# Patient Record
Sex: Female | Born: 1955 | Race: White | Hispanic: No | Marital: Married | Smoking: Former smoker
Health system: Southern US, Community
[De-identification: ages and names within clinical notes are randomized; demographics above are authoritative.]

## PROBLEM LIST (undated history)

## (undated) DIAGNOSIS — C801 Malignant (primary) neoplasm, unspecified: Secondary | ICD-10-CM

## (undated) DIAGNOSIS — K8020 Calculus of gallbladder without cholecystitis without obstruction: Secondary | ICD-10-CM

## (undated) DIAGNOSIS — B182 Chronic viral hepatitis C: Secondary | ICD-10-CM

## (undated) DIAGNOSIS — T7840XA Allergy, unspecified, initial encounter: Secondary | ICD-10-CM

## (undated) DIAGNOSIS — D649 Anemia, unspecified: Secondary | ICD-10-CM

## (undated) DIAGNOSIS — I10 Essential (primary) hypertension: Secondary | ICD-10-CM

## (undated) HISTORY — DX: Malignant (primary) neoplasm, unspecified: C80.1

## (undated) HISTORY — DX: Calculus of gallbladder without cholecystitis without obstruction: K80.20

## (undated) HISTORY — DX: Chronic viral hepatitis C: B18.2

## (undated) HISTORY — PX: COLONOSCOPY: SHX174

## (undated) HISTORY — PX: FRACTURE SURGERY: SHX138

## (undated) HISTORY — PX: UPPER GASTROINTESTINAL ENDOSCOPY: SHX188

## (undated) HISTORY — PX: TONSILLECTOMY: SUR1361

## (undated) HISTORY — PX: MOUTH SURGERY: SHX715

## (undated) HISTORY — PX: MAXILLARY SINUS LIFT: SHX5206

## (undated) HISTORY — DX: Essential (primary) hypertension: I10

## (undated) HISTORY — DX: Anemia, unspecified: D64.9

## (undated) HISTORY — DX: Allergy, unspecified, initial encounter: T78.40XA

---

## 2015-01-19 HISTORY — PX: BREAST BIOPSY: SHX20

## 2015-06-27 ENCOUNTER — Ambulatory Visit (INDEPENDENT_AMBULATORY_CARE_PROVIDER_SITE_OTHER): Payer: Self-pay | Admitting: Family Medicine

## 2015-06-27 VITALS — BP 102/74 | HR 87 | Temp 98.2°F | Resp 16 | Ht 61.5 in | Wt 126.0 lb

## 2015-06-27 DIAGNOSIS — Z1329 Encounter for screening for other suspected endocrine disorder: Secondary | ICD-10-CM

## 2015-06-27 DIAGNOSIS — Z0000 Encounter for general adult medical examination without abnormal findings: Secondary | ICD-10-CM

## 2015-06-27 DIAGNOSIS — Z1389 Encounter for screening for other disorder: Secondary | ICD-10-CM

## 2015-06-27 DIAGNOSIS — Z1211 Encounter for screening for malignant neoplasm of colon: Secondary | ICD-10-CM

## 2015-06-27 DIAGNOSIS — Z136 Encounter for screening for cardiovascular disorders: Secondary | ICD-10-CM

## 2015-06-27 DIAGNOSIS — Z130 Encounter for screening for diseases of the blood and blood-forming organs and certain disorders involving the immune mechanism: Secondary | ICD-10-CM

## 2015-06-27 DIAGNOSIS — Z720 Tobacco use: Secondary | ICD-10-CM

## 2015-06-27 DIAGNOSIS — Z1383 Encounter for screening for respiratory disorder NEC: Secondary | ICD-10-CM

## 2015-06-27 DIAGNOSIS — Z1212 Encounter for screening for malignant neoplasm of rectum: Secondary | ICD-10-CM

## 2015-06-27 DIAGNOSIS — A599 Trichomoniasis, unspecified: Secondary | ICD-10-CM

## 2015-06-27 DIAGNOSIS — F17201 Nicotine dependence, unspecified, in remission: Secondary | ICD-10-CM

## 2015-06-27 DIAGNOSIS — Z1239 Encounter for other screening for malignant neoplasm of breast: Secondary | ICD-10-CM

## 2015-06-27 DIAGNOSIS — Z124 Encounter for screening for malignant neoplasm of cervix: Secondary | ICD-10-CM

## 2015-06-27 LAB — POCT WET + KOH PREP
Trich by wet prep: ABSENT
Yeast by KOH: ABSENT
Yeast by wet prep: ABSENT

## 2015-06-27 LAB — COMPREHENSIVE METABOLIC PANEL
ALT: 43 U/L — ABNORMAL HIGH (ref 6–29)
AST: 32 U/L (ref 10–35)
Albumin: 4.8 g/dL (ref 3.6–5.1)
Alkaline Phosphatase: 70 U/L (ref 33–130)
BUN: 7 mg/dL (ref 7–25)
CALCIUM: 9.3 mg/dL (ref 8.6–10.4)
CO2: 27 mmol/L (ref 20–31)
Chloride: 96 mmol/L — ABNORMAL LOW (ref 98–110)
Creat: 0.54 mg/dL (ref 0.50–0.99)
GLUCOSE: 102 mg/dL — AB (ref 65–99)
POTASSIUM: 4.1 mmol/L (ref 3.5–5.3)
Sodium: 133 mmol/L — ABNORMAL LOW (ref 135–146)
Total Bilirubin: 0.7 mg/dL (ref 0.2–1.2)
Total Protein: 8.1 g/dL (ref 6.1–8.1)

## 2015-06-27 LAB — CBC
HEMATOCRIT: 43 % (ref 35.0–45.0)
Hemoglobin: 14.6 g/dL (ref 11.7–15.5)
MCH: 31 pg (ref 27.0–33.0)
MCHC: 34 g/dL (ref 32.0–36.0)
MCV: 91.3 fL (ref 80.0–100.0)
MPV: 10 fL (ref 7.5–12.5)
PLATELETS: 294 10*3/uL (ref 140–400)
RBC: 4.71 MIL/uL (ref 3.80–5.10)
RDW: 12.9 % (ref 11.0–15.0)
WBC: 11.4 10*3/uL — AB (ref 3.8–10.8)

## 2015-06-27 LAB — POCT URINALYSIS DIP (MANUAL ENTRY)
Bilirubin, UA: NEGATIVE
GLUCOSE UA: NEGATIVE
Ketones, POC UA: NEGATIVE
Nitrite, UA: NEGATIVE
PH UA: 6
Protein Ur, POC: NEGATIVE
RBC UA: NEGATIVE
UROBILINOGEN UA: 0.2

## 2015-06-27 LAB — TSH: TSH: 1.5 mIU/L

## 2015-06-27 LAB — LIPID PANEL
Cholesterol: 192 mg/dL (ref 125–200)
HDL: 92 mg/dL (ref >=46)
LDL CALC: 89 mg/dL (ref <130)
TRIGLYCERIDES: 54 mg/dL (ref <150)
Total CHOL/HDL Ratio: 2.1 Ratio (ref <=5.0)
VLDL: 11 mg/dL (ref <30)

## 2015-06-27 MED ORDER — VARENICLINE TARTRATE 1 MG PO TABS
1.0000 mg | ORAL_TABLET | Freq: Two times a day (BID) | ORAL | Status: DC
Start: 2015-06-27 — End: 2015-07-17

## 2015-06-27 MED ORDER — VARENICLINE TARTRATE 0.5 MG X 11 & 1 MG X 42 PO MISC: ORAL | Status: DC

## 2015-06-27 NOTE — Patient Instructions (Addendum)
IF you received an x-ray today, you will receive an invoice from [facility]. Please contact [facility] at [phone] with questions or concerns regarding your invoice.   IF you received labwork today, you will receive an invoice from [employer]. Please contact [facility] at [phone] with questions or concerns regarding your invoice.   Our billing staff will not be able to assist you with questions regarding bills from these companies.  You will be contacted with the lab results as soon as they are available. The fastest way to get your results is to activate your My Chart account. Instructions are located on the last page of this paperwork. If you have not heard from [place] regarding the results in 2 weeks, please contact this office.     Keeping You Healthy  Get These Tests  Blood Pressure- Have your blood pressure checked by your healthcare provider at least once a year.  Normal blood pressure is 120/80.  Weight- Have your body mass index (BMI) calculated to screen for obesity.  BMI is a measure of body fat based on height and weight.  You can calculate your own BMI at [employer]  Cholesterol- Have your cholesterol checked every year.  Diabetes- Have your blood sugar checked every year if you have high blood pressure, high cholesterol, a family history of diabetes or if you are overweight.  Pap Test - Have a pap test every 1 to 5 years if you have been sexually active.  If you are older than 65 and recent pap tests have been normal you may not need additional pap tests.  In addition, if you have had a hysterectomy  for benign disease additional pap tests are not necessary.  Mammogram-Yearly mammograms are essential for early detection of breast cancer  Screening for Colon Cancer- Colonoscopy starting at age 35. Screening may begin sooner depending on your family history and other health conditions.  Follow up  colonoscopy as directed by your Gastroenterologist.  Screening for Osteoporosis- Screening begins at age 30 with bone density scanning, sooner if you are at higher risk for developing Osteoporosis.  Get these medicines  Calcium with Vitamin D- Your body requires 1200-1500 mg of Calcium a day and [phone] IU of Vitamin D a day.  You can only absorb 500 mg of Calcium at a time therefore Calcium must be taken in 2 or 3 separate doses throughout the day.  Hormones- Hormone therapy has been associated with increased risk for certain cancers and heart disease.  Talk to your healthcare provider about if you need relief from menopausal symptoms.  Aspirin- Ask your healthcare provider about taking Aspirin to prevent Heart Disease and Stroke.  Get these Immuniztions  Flu shot- Every fall  Pneumonia shot- Once after the age of 21; if you are younger ask your healthcare provider if you need a pneumonia shot.  Tetanus- Every ten years.  Zostavax- Once after the age of 59 to prevent shingles.  Take these steps  Don't smoke- Your healthcare provider can help you quit. For tips on how to quit, ask your healthcare provider or go to www.smokefree.gov or call 1-800 QUIT-NOW.  Be physically active- Exercise 5 days a week for a minimum of 30 minutes.  If you are not already physically active, start slow and gradually work up to 30 minutes of moderate physical activity.  Try walking, dancing, bike riding, swimming, etc.  Eat a healthy diet- Eat a variety of healthy foods such as fruits, vegetables,  whole grains, low fat milk, low fat cheeses, yogurt, lean meats, chicken, fish, eggs, dried beans, tofu, etc.  For more information go to www.thenutritionsource.org  Dental visit- Brush and floss teeth twice daily; visit your dentist twice a year.  Eye exam- Visit your Optometrist or Ophthalmologist yearly.  Drink alcohol in moderation- Limit alcohol intake to one drink or less a day.  Never drink and  drive.  Depression- Your emotional health is as important as your physical health.  If you're feeling down or losing interest in things you normally enjoy, please talk to your healthcare provider.  Seat Belts- can save your life; always wear one  Smoke/Carbon Monoxide detectors- These detectors need to be installed on the appropriate level of your home.  Replace batteries at least once a year.  Violence- If anyone is threatening or hurting you, please tell your healthcare provider. Menopause is a normal process in which your reproductive ability comes to an end. This process happens gradually over a span of months to years, usually between the ages of 71 and 80. Menopause is complete when you have missed 12 consecutive menstrual periods. It is important to talk with your health care provider about some of the most common conditions that affect postmenopausal women, such as heart disease, cancer, and bone loss (osteoporosis). Adopting a healthy lifestyle and getting preventive care can help to promote your health and wellness. Those actions can also lower your chances of developing some of these common conditions. WHAT SHOULD I KNOW ABOUT MENOPAUSE? During menopause, you may experience a number of symptoms, such as:  Moderate-to-severe hot flashes.  Night sweats.  Decrease in sex drive.  Mood swings.  Headaches.  Tiredness.  Irritability.  Memory problems.  Insomnia. Choosing to treat or not to treat menopausal changes is an individual decision that you make with your health care provider. WHAT SHOULD I KNOW ABOUT HORMONE REPLACEMENT THERAPY AND SUPPLEMENTS? Hormone therapy products are effective for treating symptoms that are associated with menopause, such as hot flashes and night sweats. Hormone replacement carries certain risks, especially as you become older. If you are thinking about using estrogen or estrogen with progestin treatments, discuss the benefits and risks with your  health care provider. WHAT SHOULD I KNOW ABOUT HEART DISEASE AND STROKE? Heart disease, heart attack, and stroke become more likely as you age. This may be due, in part, to the hormonal changes that your body experiences during menopause. These can affect how your body processes dietary fats, triglycerides, and cholesterol. Heart attack and stroke are both medical emergencies. There are many things that you can do to help prevent heart disease and stroke:  Have your blood pressure checked at least every 1-2 years. High blood pressure causes heart disease and increases the risk of stroke.  If you are 42-15 years old, ask your health care provider if you should take aspirin to prevent a heart attack or a stroke.  Do not use any tobacco products, including cigarettes, chewing tobacco, or electronic cigarettes. If you need help quitting, ask your health care provider.  It is important to eat a healthy diet and maintain a healthy weight.  Be sure to include plenty of vegetables, fruits, low-fat dairy products, and lean protein.  Avoid eating foods that are high in solid fats, added sugars, or salt (sodium).  Get regular exercise. This is one of the most important things that you can do for your health.  Try to exercise for at least 150 minutes each week. The  type of exercise that you do should increase your heart rate and make you sweat. This is known as moderate-intensity exercise.  Try to do strengthening exercises at least twice each week. Do these in addition to the moderate-intensity exercise.  Know your numbers.Ask your health care provider to check your cholesterol and your blood glucose. Continue to have your blood tested as directed by your health care provider. WHAT SHOULD I KNOW ABOUT CANCER SCREENING? There are several types of cancer. Take the following steps to reduce your risk and to catch any cancer development as early as possible. Breast Cancer  Practice breast  self-awareness.  This means understanding how your breasts normally appear and feel.  It also means doing regular breast self-exams. Let your health care provider know about any changes, no matter how small.  If you are 72 or older, have a clinician do a breast exam (clinical breast exam or CBE) every year. Depending on your age, family history, and medical history, it may be recommended that you also have a yearly breast X-ray (mammogram).  If you have a family history of breast cancer, talk with your health care provider about genetic screening.  If you are at high risk for breast cancer, talk with your health care provider about having an MRI and a mammogram every year.  Breast cancer (BRCA) gene test is recommended for women who have family members with BRCA-related cancers. Results of the assessment will determine the need for genetic counseling and BRCA1 and for BRCA2 testing. BRCA-related cancers include these types:  Breast. This occurs in males or females.  Ovarian.  Tubal. This may also be called fallopian tube cancer.  Cancer of the abdominal or pelvic lining (peritoneal cancer).  Prostate.  Pancreatic. Cervical, Uterine, and Ovarian Cancer Your health care provider may recommend that you be screened regularly for cancer of the pelvic organs. These include your ovaries, uterus, and vagina. This screening involves a pelvic exam, which includes checking for microscopic changes to the surface of your cervix (Pap test).  For women ages 21-65, health care providers may recommend a pelvic exam and a Pap test every three years. For women ages 42-65, they may recommend the Pap test and pelvic exam, combined with testing for human papilloma virus (HPV), every five years. Some types of HPV increase your risk of cervical cancer. Testing for HPV may also be done on women of any age who have unclear Pap test results.  Other health care providers may not recommend any screening for  nonpregnant women who are considered low risk for pelvic cancer and have no symptoms. Ask your health care provider if a screening pelvic exam is right for you.  If you have had past treatment for cervical cancer or a condition that could lead to cancer, you need Pap tests and screening for cancer for at least 20 years after your treatment. If Pap tests have been discontinued for you, your risk factors (such as having a new sexual partner) need to be reassessed to determine if you should start having screenings again. Some women have medical problems that increase the chance of getting cervical cancer. In these cases, your health care provider may recommend that you have screening and Pap tests more often.  If you have a family history of uterine cancer or ovarian cancer, talk with your health care provider about genetic screening.  If you have vaginal bleeding after reaching menopause, tell your health care provider.  There are currently no reliable tests available  to screen for ovarian cancer. Lung Cancer Lung cancer screening is recommended for adults 27-8 years old who are at high risk for lung cancer because of a history of smoking. A yearly low-dose CT scan of the lungs is recommended if you:  Currently smoke.  Have a history of at least 30 pack-years of smoking and you currently smoke or have quit within the past 15 years. A pack-year is smoking an average of one pack of cigarettes per day for one year. Yearly screening should:  Continue until it has been 15 years since you quit.  Stop if you develop a health problem that would prevent you from having lung cancer treatment. Colorectal Cancer  This type of cancer can be detected and can often be prevented.  Routine colorectal cancer screening usually begins at age 86 and continues through age 57.  If you have risk factors for colon cancer, your health care provider may recommend that you be screened at an earlier age.  If you have  a family history of colorectal cancer, talk with your health care provider about genetic screening.  Your health care provider may also recommend using home test kits to check for hidden blood in your stool.  A small camera at the end of a tube can be used to examine your colon directly (sigmoidoscopy or colonoscopy). This is done to check for the earliest forms of colorectal cancer.  Direct examination of the colon should be repeated every 5-10 years until age 4. However, if early forms of precancerous polyps or small growths are found or if you have a family history or genetic risk for colorectal cancer, you may need to be screened more often. Skin Cancer  Check your skin from head to toe regularly.  Monitor any moles. Be sure to tell your health care provider:  About any new moles or changes in moles, especially if there is a change in a mole's shape or color.  If you have a mole that is larger than the size of a pencil eraser.  If any of your family members has a history of skin cancer, especially at a young age, talk with your health care provider about genetic screening.  Always use sunscreen. Apply sunscreen liberally and repeatedly throughout the day.  Whenever you are outside, protect yourself by wearing long sleeves, pants, a wide-brimmed hat, and sunglasses. WHAT SHOULD I KNOW ABOUT OSTEOPOROSIS? Osteoporosis is a condition in which bone destruction happens more quickly than new bone creation. After menopause, you may be at an increased risk for osteoporosis. To help prevent osteoporosis or the bone fractures that can happen because of osteoporosis, the following is recommended:  If you are 30-71 years old, get at least 1,000 mg of calcium and at least 600 mg of vitamin D per day.  If you are older than age 33 but younger than age 70, get at least 1,200 mg of calcium and at least 600 mg of vitamin D per day.  If you are older than age 69, get at least 1,200 mg of calcium and  at least 800 mg of vitamin D per day. Smoking and excessive alcohol intake increase the risk of osteoporosis. Eat foods that are rich in calcium and vitamin D, and do weight-bearing exercises several times each week as directed by your health care provider. WHAT SHOULD I KNOW ABOUT HOW MENOPAUSE AFFECTS [place]? Depression may occur at any age, but it is more common as you become older. Common symptoms of  depression include:  Low or sad mood.  Changes in sleep patterns.  Changes in appetite or eating patterns.  Feeling an overall lack of motivation or enjoyment of activities that you previously enjoyed.  Frequent crying spells. Talk with your health care provider if you think that you are experiencing depression. WHAT SHOULD I KNOW ABOUT IMMUNIZATIONS? It is important that you get and maintain your immunizations. These include:  Tetanus, diphtheria, and pertussis (Tdap) booster vaccine.  Influenza every year before the flu season begins.  Pneumonia vaccine.  Shingles vaccine. Your health care provider may also recommend other immunizations.   This information is not intended to replace advice given to you by your health care provider. Make sure you discuss any questions you have with your health care provider.   Document Released: 02/26/2005 Document Revised: 01/25/2014 Document Reviewed: 09/06/2013 Elsevier Interactive Patient Education 2016 [place] care power of attorney- Discuss with your healthcare provider and family.  Smoking Cessation, Tips for Success If you are ready to quit smoking, congratulations! You have chosen to help yourself be healthier. Cigarettes bring nicotine, tar, carbon monoxide, and other irritants into your body. Your lungs, heart, and blood vessels will be able to work better without these poisons. There are many different ways to quit smoking. Nicotine gum, nicotine patches, a nicotine inhaler, or nicotine nasal  spray can help with physical craving. Hypnosis, support groups, and medicines help break the habit of smoking. WHAT THINGS CAN I DO TO MAKE QUITTING EASIER?  Here are some tips to help you quit for good:  Pick a date when you will quit smoking completely. Tell all of your friends and family about your plan to quit on that date.  Do not try to slowly cut down on the number of cigarettes you are smoking. Pick a quit date and quit smoking completely starting on that day.  Throw away all cigarettes.   Clean and remove all ashtrays from your home, work, and car.  On a card, write down your reasons for quitting. Carry the card with you and read it when you get the urge to smoke.  Cleanse your body of nicotine. Drink enough water and fluids to keep your urine clear or pale yellow. Do this after quitting to flush the nicotine from your body.  Learn to predict your moods. Do not let a bad situation be your excuse to have a cigarette. Some situations in your life might tempt you into wanting a cigarette.  Never have "just one" cigarette. It leads to wanting another and another. Remind yourself of your decision to quit.  Change habits associated with smoking. If you smoked while driving or when feeling stressed, try other activities to replace smoking. Stand up when drinking your coffee. Brush your teeth after eating. Sit in a different chair when you read the paper. Avoid alcohol while trying to quit, and try to drink fewer caffeinated beverages. Alcohol and caffeine may urge you to smoke.  Avoid foods and drinks that can trigger a desire to smoke, such as sugary or spicy foods and alcohol.  Ask people who smoke not to smoke around you.  Have something planned to do right after eating or having a cup of coffee. For example, plan to take a walk or exercise.  Try a relaxation exercise to calm you down and decrease your stress. Remember, you may be tense and nervous for the first 2 weeks after you  quit, but this will pass.  Find new activities to keep your hands busy. Play with a pen, coin, or rubber band. Doodle or draw things on paper.  Brush your teeth right after eating. This will help cut down on the craving for the taste of tobacco after meals. You can also try mouthwash.   Use oral substitutes in place of cigarettes. Try using lemon drops, carrots, cinnamon sticks, or chewing gum. Keep them handy so they are available when you have the urge to smoke.  When you have the urge to smoke, try deep breathing.  Designate your home as a nonsmoking area.  If you are a heavy smoker, ask your health care provider about a prescription for nicotine chewing gum. It can ease your withdrawal from nicotine.  Reward yourself. Set aside the cigarette money you save and buy yourself something nice.  Look for support from others. Join a support group or smoking cessation program. Ask someone at home or at work to help you with your plan to quit smoking.  Always ask yourself, "Do I need this cigarette or is this just a reflex?" Tell yourself, "Today, I choose not to smoke," or "I do not want to smoke." You are reminding yourself of your decision to quit.  Do not replace cigarette smoking with electronic cigarettes (commonly called e-cigarettes). The safety of e-cigarettes is unknown, and some may contain harmful chemicals.  If you relapse, do not give up! Plan ahead and think about what you will do the next time you get the urge to smoke. HOW WILL I FEEL WHEN I QUIT SMOKING? You may have symptoms of withdrawal because your body is used to nicotine (the addictive substance in cigarettes). You may crave cigarettes, be irritable, feel very hungry, cough often, get headaches, or have difficulty concentrating. The withdrawal symptoms are only temporary. They are strongest when you first quit but will go away within 10-14 days. When withdrawal symptoms occur, stay in control. Think about your reasons for  quitting. Remind yourself that these are signs that your body is healing and getting used to being without cigarettes. Remember that withdrawal symptoms are easier to treat than the major diseases that smoking can cause.  Even after the withdrawal is over, expect periodic urges to smoke. However, these cravings are generally short lived and will go away whether you smoke or not. Do not smoke! WHAT RESOURCES ARE AVAILABLE TO HELP ME QUIT SMOKING? Your health care provider can direct you to community resources or hospitals for support, which may include:  Group support.  Education.  Hypnosis.  Therapy.   This information is not intended to replace advice given to you by your health care provider. Make sure you discuss any questions you have with your health care provider.   Document Released: 10/03/2003 Document Revised: 01/25/2014 Document Reviewed: 06/22/2012 Elsevier Interactive Patient Education [employer].

## 2015-06-27 NOTE — Progress Notes (Addendum)
Subjective:  By signing my name below, I, Brittany Summers, attest that this documentation has been prepared under the direction and in the presence of [name], MD.  Electronically Signed: [name], ED Scribe. 06/27/2015. 8:23 AM.   Patient ID: Brittany Summers, female    DOB: 08/12/1955, 60 y.o.   MRN: [id number]  HPI Chief Complaint  Patient presents with  . Annual Exam   HPI Comments: Brittany Summers is a 60 y.o. female who presents to the [facility] for an annual exam. Pt states she has not been under medical care in a while and has come in for a physical.  Tobacco abuse Pt would like to quit smoking and is interested in Chantix. She has tried the nicotine patch and e-cigs and has gotten down to 2-5 cigarettes a day but has never fully quit. She does not smoke in the house or car. Her husband does not smoke. Her sister has tried Chantix which worked well for her and has not smoked in 4 years.   Dental  Pt is scheduled for dental work. She is on clindamycin for wisdom tooth infection.   Exercise Pt does not exercise much but is trying to start walking regularly. She tries to maintain a healthy diet including salads and yogurts.   Immunization It has been several years since she's had TDAP She does not get flu shots.  Cancer screening  Pap smear- has been over 10 years. She denies hx of abnormal pap.  Mammogram- also has been several years since last exam. Her mother and grandmother with hx of breast cancer; mother diagnosed in 50-60s, grandmother diagnosed in 67s. Pt has a sister who had a biopsy about 10 years ago which was normal.   Menopause  LMP- 10 years ago. Reports beginning menopause in early 27's. No hot flashes during the day, occasionally at night . She denies vaginal bleeding.   Pt denies trouble with sleep, SOB, mood disorder   There are no active problems to display for this patient.  No past medical history on file. No past surgical history  on file. Allergies not on file Prior to Admission medications   Not on File   Social History   Social History  . Marital Status: Married    Spouse Name: N/A  . Number of Children: N/A  . Years of Education: N/A   Occupational History  . Not on file.   Social History Main Topics  . Smoking status: Current Every Day Smoker  . Smokeless tobacco: Not on file  . Alcohol Use: Not on file  . Drug Use: Not on file  . Sexual Activity: Not on file   Other Topics Concern  . Not on file   Social History Narrative  . No narrative on file   Family History  Problem Relation Age of Onset  . Hypertension Mother   . Cancer Mother   . Hypertension Father   . Diabetes Father   . Cancer Father     Review of Systems  All other systems reviewed and are negative.   Objective:   Physical Exam  Constitutional: She is oriented to person, place, and time. She appears well-developed and well-nourished. No distress.  HENT:  Head: Normocephalic and atraumatic.  Right Ear: Tympanic membrane, external ear and ear canal normal.  Left Ear: Tympanic membrane, external ear and ear canal normal.  Nose: Nose normal. No rhinorrhea.  Mouth/Throat: Uvula is midline and mucous membranes are normal. Posterior oropharyngeal  edema and posterior oropharyngeal erythema present.  Poor dentition, and dry tongue.  Eyes: Conjunctivae and EOM are normal. Pupils are equal, round, and reactive to light. Right eye exhibits no discharge. Left eye exhibits no discharge. No scleral icterus.  Neck: Normal range of motion. Neck supple. No thyromegaly present.  Cardiovascular: Normal rate, regular rhythm, S1 normal, S2 normal, normal heart sounds and intact distal pulses.   No murmur heard. Pulmonary/Chest: Effort normal and breath sounds normal. No respiratory distress. She has no wheezes. She has no rales.  Abdominal: Soft. Bowel sounds are normal. There is no tenderness.  Genitourinary: Uterus normal. Rectal exam  shows no external hemorrhoid. No breast swelling, tenderness, discharge or bleeding. Cervix exhibits no motion tenderness and no friability. Right adnexum displays no mass and no tenderness. Left adnexum displays no mass and no tenderness. Vaginal discharge ( moderate white ) found.  Normal breast exam Labia is normal Cervix is stenotic Uterus and adnexa is normally palpated  Musculoskeletal: Normal range of motion. She exhibits no edema.  Lymphadenopathy:       Head (right side): Tonsillar adenopathy present.       Head (left side): Tonsillar adenopathy present.    She has cervical adenopathy.    She has no axillary adenopathy.       Right: No supraclavicular adenopathy present.       Left: No supraclavicular adenopathy present.  Neurological: She is alert and oriented to person, place, and time. She has normal reflexes.  Skin: Skin is warm and dry. She is not diaphoretic. No erythema.  Psychiatric: She has a normal mood and affect. Her behavior is normal.  Nursing note and vitals reviewed.   Filed Vitals:   06/27/15 0822  BP: 102/74  Pulse: 87  Temp: 98.2 F (36.8 C)  Resp: 16  Height: 5' 1.5" (1.562 m)  Weight: 126 lb (57.153 kg)  SpO2: 99%    Results for orders placed or performed in visit on 06/27/15  POCT urinalysis dipstick  Result Value Ref Range   Color, UA yellow yellow   Clarity, UA clear clear   Glucose, UA negative negative   Bilirubin, UA negative negative   Ketones, POC UA negative negative   Spec Grav, UA <=1.005    Blood, UA negative negative   pH, UA 6.0    Protein Ur, POC negative negative   Urobilinogen, UA 0.2    Nitrite, UA Negative Negative   Leukocytes, UA Trace (A) Negative  POCT Wet + KOH Prep  Result Value Ref Range   Yeast by KOH Absent Present, Absent   Yeast by wet prep Absent Present, Absent   WBC by wet prep None None, Few, Too numerous to count   Clue Cells Wet Prep HPF POC Few (A) None, Too numerous to count   Trich by wet prep  Absent Present, Absent   Bacteria Wet Prep HPF POC Few None, Few, Too numerous to count   Epithelial Cells By [employer] Pref ([facility]) Few None, Few, Too numerous to count   RBC,UR,HPF,POC None None RBC/hpf   Assessment & Plan:   1. Annual physical exam   2. Screening for breast cancer   3. Screening for cardiovascular, respiratory, and genitourinary diseases   4. Screening for cervical cancer - wet prep nml but few clue cells - will treat for BV with flagyl if any abnml seen on pap - she did have d/c on exam but pt did not have any sxs  5. Screening for colorectal  cancer   6. Screening for deficiency anemia   7. Screening for thyroid disorder   8. Tobacco use disorder, mild, in early remission, abuse - pt determined to quit, sister responded well to chantix, life set up well, no risk factors concerning for side effects.  9.      Trichomonas - incidentally found on pap, treat with flagyl 2gx1, inform partner (husband).  Orders Placed This Encounter  Procedures  . MM Digital Screening    Standing Status: Future     Number of Occurrences:      Standing Expiration Date: 08/26/2016    Order Specific Question:  Reason for Exam (SYMPTOM  OR DIAGNOSIS REQUIRED)    Answer:  screening    Order Specific Question:  Preferred imaging location?    Answer:  [facility]  . CBC  . Comprehensive metabolic panel    Order Specific Question:  Has the patient fasted?    Answer:  Yes  . TSH  . Lipid panel    Order Specific Question:  Has the patient fasted?    Answer:  Yes  . POCT urinalysis dipstick  . POC Hemoccult Bld/Stl (3-Cd Home Screen)    Standing Status: Future     Number of Occurrences:      Standing Expiration Date: 06/26/2016  . POCT Wet + KOH Prep    Meds ordered this encounter  Medications  . clindamycin (CLEOCIN) 150 MG capsule    Sig: Take by mouth 3 (three) times daily.  . varenicline (CHANTIX STARTING MONTH PAK) 0.5 MG X 11 & 1 MG X 42 tablet    Sig: Take one 0.5 mg tablet by  mouth once daily for 3 days, then increase to one 0.5 mg tablet twice daily for 4 days, then increase to one 1 mg tablet twice daily.    Dispense:  53 tablet    Refill:  0    Ok to sub same sig with all 0.5mg  tabs rather than the starter pack if less expensive.  . varenicline (CHANTIX CONTINUING MONTH PAK) 1 MG tablet    Sig: Take 1 tablet (1 mg total) by mouth 2 (two) times daily.    Dispense:  60 tablet    Refill:  5   I personally performed the services described in this documentation, which was scribed in my presence. The recorded information has been reviewed and considered, and addended by me as needed.   [name], M.D.  [facility] [address] [place], [place] [address] [phone] phone [phone] fax  06/27/2015 11:08 AM

## 2015-07-01 LAB — PAP IG AND HPV HIGH-RISK: HPV DNA High Risk: NOT DETECTED

## 2015-07-02 MED ORDER — METRONIDAZOLE 500 MG PO TABS: 2000.0000 mg | ORAL_TABLET | Freq: Once | ORAL | Status: DC

## 2015-07-02 NOTE — Addendum Note (Signed)
Addended by: [name] on: 07/02/2015 01:14 AM   Modules accepted: Orders, SmartSet

## 2015-07-04 ENCOUNTER — Other Ambulatory Visit: Payer: Self-pay | Admitting: *Deleted

## 2015-07-04 DIAGNOSIS — Z0000 Encounter for general adult medical examination without abnormal findings: Secondary | ICD-10-CM

## 2015-07-04 LAB — POC HEMOCCULT BLD/STL (HOME/3-CARD/SCREEN)
Card #3 Fecal Occult Blood, POC: NEGATIVE
FECAL OCCULT BLD: NEGATIVE
FECAL OCCULT BLD: NEGATIVE

## 2015-07-15 ENCOUNTER — Ambulatory Visit
Admission: RE | Admit: 2015-07-15 | Discharge: 2015-07-15 | Disposition: A | Discharge status: Home / Self Care | Payer: No Typology Code available for payment source | Source: Ambulatory Visit | Attending: Family Medicine | Admitting: Family Medicine

## 2015-07-15 DIAGNOSIS — Z1239 Encounter for other screening for malignant neoplasm of breast: Secondary | ICD-10-CM

## 2015-07-17 ENCOUNTER — Telehealth: Payer: Self-pay

## 2015-07-17 MED ORDER — VARENICLINE TARTRATE 1 MG PO TABS: 1.0000 mg | ORAL_TABLET | Freq: Two times a day (BID) | ORAL | Status: DC

## 2015-07-17 NOTE — Telephone Encounter (Signed)
fax req Chantix - routed to Dr. Brigitte Pulse

## 2015-07-17 NOTE — Telephone Encounter (Addendum)
At pt's appt on 6/9 I gave her a 7 month supply.  After the starting month, she then should fill the continuing month rx I gave her.  Reordered med for her and sent to [facility]

## 2015-07-17 NOTE — Addendum Note (Signed)
Addended by: [name] on: 07/17/2015 06:20 PM   Modules accepted: Orders

## 2015-07-21 ENCOUNTER — Other Ambulatory Visit: Payer: Self-pay | Admitting: Family Medicine

## 2015-07-21 DIAGNOSIS — R928 Other abnormal and inconclusive findings on diagnostic imaging of breast: Secondary | ICD-10-CM

## 2015-08-01 ENCOUNTER — Ambulatory Visit
Admission: RE | Admit: 2015-08-01 | Discharge: 2015-08-01 | Disposition: A | Discharge status: Home / Self Care | Payer: No Typology Code available for payment source | Source: Ambulatory Visit | Attending: Family Medicine | Admitting: Family Medicine

## 2015-08-01 ENCOUNTER — Other Ambulatory Visit: Payer: Self-pay | Admitting: Family Medicine

## 2015-08-01 DIAGNOSIS — R2232 Localized swelling, mass and lump, left upper limb: Secondary | ICD-10-CM

## 2015-08-01 DIAGNOSIS — N6320 Unspecified lump in the left breast, unspecified quadrant: Secondary | ICD-10-CM

## 2015-08-01 DIAGNOSIS — R928 Other abnormal and inconclusive findings on diagnostic imaging of breast: Secondary | ICD-10-CM

## 2015-08-05 ENCOUNTER — Encounter (HOSPITAL_COMMUNITY): Payer: Self-pay

## 2015-08-05 ENCOUNTER — Ambulatory Visit
Admission: RE | Admit: 2015-08-05 | Discharge: 2015-08-05 | Disposition: A | Discharge status: Home / Self Care | Payer: No Typology Code available for payment source | Source: Ambulatory Visit | Attending: Family Medicine | Admitting: Family Medicine

## 2015-08-05 ENCOUNTER — Ambulatory Visit (HOSPITAL_COMMUNITY)
Admission: RE | Admit: 2015-08-05 | Discharge: 2015-08-05 | Disposition: A | Discharge status: Home / Self Care | Payer: Self-pay | Source: Ambulatory Visit | Attending: Obstetrics and Gynecology | Admitting: Obstetrics and Gynecology

## 2015-08-05 ENCOUNTER — Other Ambulatory Visit: Payer: Self-pay | Admitting: Family Medicine

## 2015-08-05 VITALS — BP 120/82 | Temp 99.1°F | Ht 61.0 in | Wt 129.0 lb

## 2015-08-05 DIAGNOSIS — Z1239 Encounter for other screening for malignant neoplasm of breast: Secondary | ICD-10-CM

## 2015-08-05 DIAGNOSIS — N6320 Unspecified lump in the left breast, unspecified quadrant: Secondary | ICD-10-CM

## 2015-08-05 DIAGNOSIS — R2232 Localized swelling, mass and lump, left upper limb: Secondary | ICD-10-CM

## 2015-08-05 NOTE — Patient Instructions (Signed)
Educational materials on self breast awareness given. Explained to Brittany Summers that she did not need a Pap smear today due to last Pap smear was 06/27/2015. Let her know BCCCP will cover Pap smears and HPV typing every 5 years unless has a history of abnormal Pap smears. Referred patient to the [facility] for a left breast biopsy per recommendation. Appointment scheduled for Wednesday, August 06, 2015 at 1530. Patient aware of appointment and will be there. Sheri Pyle verbalized understanding.  Lynia Landry, Arvil Chaco, RN 4:45 PM

## 2015-08-05 NOTE — Progress Notes (Signed)
Patient referred to [facility] by the [facility] due to recommending a left breast biopsy for follow up of abnormal left breast diagnostic mammogram and ultrasound completed 08/01/2015. Screening mammogram was completed 07/15/2015 that recommended additional imaging of the left breast.   Pap Smear: Pap smear not completed today. Last Pap smear was 06/27/2015 at Dr. Raul Del office and normal with negative HPV. Per patient has no history of an abnormal Pap smear. Last Pap smear result is in EPIC.  Physical exam: Breasts Breasts symmetrical. No skin abnormalities bilateral breasts. No nipple retraction bilateral breasts. No nipple discharge bilateral breasts. No lymphadenopathy. No lumps palpated bilateral breasts. Unable to palpate a lump in the area of concern within the left breast. No complaints of pain or tenderness on exam. Referred patient to the [facility] for a left breast biopsy per recommendation. Appointment scheduled for Wednesday, August 06, 2015 at 1530.     Pelvic/Bimanual No Pap smear completed today since last Pap smear was 06/27/2015. Pap smear not indicated per BCCCP guidelines.   Smoking History: Patient has never smoked.  Patient Navigation: Patient education provided. Access to services provided for patient through [facility] program.   Colorectal Cancer Screening: Patient has never had a colonoscopy. No complaints today.

## 2015-08-06 ENCOUNTER — Encounter (HOSPITAL_COMMUNITY): Payer: Self-pay | Admitting: *Deleted

## 2015-08-06 ENCOUNTER — Ambulatory Visit
Admission: RE | Admit: 2015-08-06 | Discharge: 2015-08-06 | Disposition: A | Discharge status: Home / Self Care | Payer: No Typology Code available for payment source | Source: Ambulatory Visit | Attending: Family Medicine | Admitting: Family Medicine

## 2015-10-08 ENCOUNTER — Ambulatory Visit: Payer: Self-pay | Admitting: Family Medicine

## 2016-03-17 NOTE — Progress Notes (Signed)
Subjective:    Patient ID: Brittany Summers, female    DOB: December 14, 1955, 61 y.o.   MRN: [id number] Chief Complaint  Patient presents with  . Annual Exam    CPE    HPI   Last CPE 06/27/15 Had dental surgery done 2 weeks ago which caused pain and stress and BP went up but has noticed over the past 6 mos her BP is gradually increasing.  She was put clindamycin for a week which caused a lot of gastritis and gassiness.   Has had HAs which is likely due to dental surgery.  She is going to have more oral surgery with possible bone grafting so she is wanting some surgical clearance. GERD: from clindamycin. Tried mylanta occ.  Tobacco abuse: stopped smoking 8 mos prior after 3 mos of chantix BP had been 118/80s and now 140s/80s Gets stressed out easier. Less coffee - change from 3 to 1.5 cups. 15 lbs weight gain since smoking cessation - currently  Declines pelvic exam - no issues  History reviewed. No pertinent past medical history. Past Surgical History:  Procedure Laterality Date  . TONSILLECTOMY     No current outpatient prescriptions on file prior to visit.   No current facility-administered medications on file prior to visit.    Allergies  Allergen Reactions  . Penicillins    Family History  Problem Relation Age of Onset  . Hypertension Mother   . Breast cancer Mother   . Hypertension Father   . Diabetes Father   . Cancer Father   . Breast cancer Maternal Grandmother    Social History   Social History  . Marital status: Married    Spouse name: N/A  . Number of children: N/A  . Years of education: N/A   Social History Main Topics  . Smoking status: Former Smoker    Quit date: 07/15/2015  . Smokeless tobacco: Never Used  . Alcohol use 3.0 oz/week    5 Glasses of wine per week  . Drug use: No  . Sexual activity: Yes   Other Topics Concern  . None   Social History Narrative  . None   Depression screen [facility] 2/9 03/18/2016 06/27/2015  Decreased Interest 1 0  Down,  Depressed, Hopeless 2 0  PHQ - 2 Score 3 0  Altered sleeping 1 -  Tired, decreased energy 1 -  Change in appetite 3 -  Feeling bad or failure about yourself  1 -  Trouble concentrating 0 -  Moving slowly or fidgety/restless 1 -  Suicidal thoughts 0 -  PHQ-9 Score 10 -  Difficult doing work/chores Somewhat difficult -    Review of Systems  HENT: Positive for dental problem.   Psychiatric/Behavioral: Positive for dysphoric mood. The patient is nervous/anxious.   All other systems reviewed and are negative.      Objective:   Physical Exam  Constitutional: She is oriented to person, place, and time. She appears well-developed and well-nourished. No distress.  HENT:  Head: Normocephalic and atraumatic.  Right Ear: Tympanic membrane, external ear and ear canal normal.  Left Ear: Tympanic membrane, external ear and ear canal normal.  Nose: Nose normal. No mucosal edema or rhinorrhea.  Mouth/Throat: Uvula is midline, oropharynx is clear and moist and mucous membranes are normal. No posterior oropharyngeal erythema.  Eyes: Conjunctivae and EOM are normal. Pupils are equal, round, and reactive to light. Right eye exhibits no discharge. Left eye exhibits no discharge. No scleral icterus.  Neck: Normal range of  motion. Neck supple. No thyromegaly present.  Cardiovascular: Normal rate, regular rhythm, normal heart sounds and intact distal pulses.   Pulmonary/Chest: Effort normal and breath sounds normal. No respiratory distress.  Abdominal: Soft. Bowel sounds are normal. She exhibits no distension. There is no hepatosplenomegaly. There is no tenderness. There is no rebound, no guarding and no CVA tenderness.  Genitourinary: No breast swelling, tenderness, discharge or bleeding.  Musculoskeletal: She exhibits no edema.  Lymphadenopathy:    She has no cervical adenopathy.  Neurological: She is alert and oriented to person, place, and time. She has normal reflexes.  Skin: Skin is warm and dry.  She is not diaphoretic. No erythema.  Psychiatric: She has a normal mood and affect. Her behavior is normal.    BP (!) 147/83 (BP Location: Right Arm, Patient Position: Sitting, Cuff Size: Small)   Pulse 84   Temp 98.2 F (36.8 C) (Oral)   Ht 5\' 1"  (1.549 m)   Wt 142 lb 3.2 oz (64.5 kg)   LMP  (LMP Unknown)   SpO2 98%   BMI 26.87 kg/m      [facility] reading (PRIMARY) by  Dr. Brigitte Pulse. EKG: NSR, no acute ischemic change or sign of strain.   However, on review of the chart, the EKG cannot be pulled up under the EKG tab - it shows that it was completed but no hyperlink is available for it to be viewed. If this is still the case at the next visit - will need to repeat (and make sure pt is not billed for it). Assessment & Plan:  Hgba1c, glucose  1. Annual physical exam   2. Routine screening for STI (sexually transmitted infection)   3. Encounter for screening mammogram for breast cancer   4. Screening for cardiovascular, respiratory, and genitourinary diseases   5. Screening for cervical cancer   6. Screening for colorectal cancer - Refer to GI for colonoscopy  7. Screening for deficiency anemia   8. Screening for thyroid disorder   9. Essential hypertension - start hctz 25 mg  10. History of tobacco abuse - needs screening lung CT scan - referral placed.    Orders Placed This Encounter  Procedures  . Tdap vaccine greater than or equal to 7yo IM  . Flu Vaccine QUAD 36+ mos IM  . Lipid panel    Order Specific Question:   Has the patient fasted?    Answer:   Yes  . TSH  . Comprehensive metabolic panel    Order Specific Question:   Has the patient fasted?    Answer:   Yes  . CBC  . HIV antibody  . HCV Ab w/Rflx to Verification  . HCV RNA NAA QUALITATIVE  . Interpretation:  . Ambulatory referral to Gastroenterology    Referral Priority:   Routine    Referral Type:   Consultation    Referral Reason:   Specialty Services Required    Number of Visits Requested:   1  . Ambulatory  Referral for Lung Cancer Scre    Referral Priority:   Routine    Referral Type:   Consultation    Referral Reason:   Specialty Services Required    Number of Visits Requested:   1  . POCT urinalysis dipstick  . EKG 12-Lead    Meds ordered this encounter  Medications  . hydrochlorothiazide (HYDRODIURIL) 25 MG tablet    Sig: Take 1 tablet (25 mg total) by mouth daily.    Dispense:  30 tablet  Refill:  5    [name], M.D.  Primary Care at [facility] [address] [place], [place] [address] [phone] phone [phone] fax  03/26/16 5:13 PM

## 2016-03-18 ENCOUNTER — Encounter: Payer: Self-pay | Admitting: Gastroenterology

## 2016-03-18 ENCOUNTER — Encounter: Payer: Self-pay | Admitting: Family Medicine

## 2016-03-18 ENCOUNTER — Ambulatory Visit (INDEPENDENT_AMBULATORY_CARE_PROVIDER_SITE_OTHER): Payer: BLUE CROSS/BLUE SHIELD | Admitting: Family Medicine

## 2016-03-18 VITALS — BP 147/83 | HR 84 | Temp 98.2°F | Ht 61.0 in | Wt 142.2 lb

## 2016-03-18 DIAGNOSIS — I10 Essential (primary) hypertension: Secondary | ICD-10-CM | POA: Diagnosis not present

## 2016-03-18 DIAGNOSIS — Z1211 Encounter for screening for malignant neoplasm of colon: Secondary | ICD-10-CM | POA: Diagnosis not present

## 2016-03-18 DIAGNOSIS — Z0000 Encounter for general adult medical examination without abnormal findings: Secondary | ICD-10-CM

## 2016-03-18 DIAGNOSIS — Z1231 Encounter for screening mammogram for malignant neoplasm of breast: Secondary | ICD-10-CM

## 2016-03-18 DIAGNOSIS — Z113 Encounter for screening for infections with a predominantly sexual mode of transmission: Secondary | ICD-10-CM | POA: Diagnosis not present

## 2016-03-18 DIAGNOSIS — Z1383 Encounter for screening for respiratory disorder NEC: Secondary | ICD-10-CM | POA: Diagnosis not present

## 2016-03-18 DIAGNOSIS — Z136 Encounter for screening for cardiovascular disorders: Secondary | ICD-10-CM | POA: Diagnosis not present

## 2016-03-18 DIAGNOSIS — Z124 Encounter for screening for malignant neoplasm of cervix: Secondary | ICD-10-CM | POA: Diagnosis not present

## 2016-03-18 DIAGNOSIS — Z130 Encounter for screening for diseases of the blood and blood-forming organs and certain disorders involving the immune mechanism: Secondary | ICD-10-CM | POA: Diagnosis not present

## 2016-03-18 DIAGNOSIS — Z1212 Encounter for screening for malignant neoplasm of rectum: Secondary | ICD-10-CM

## 2016-03-18 DIAGNOSIS — Z1329 Encounter for screening for other suspected endocrine disorder: Secondary | ICD-10-CM | POA: Diagnosis not present

## 2016-03-18 DIAGNOSIS — Z87891 Personal history of nicotine dependence: Secondary | ICD-10-CM | POA: Diagnosis not present

## 2016-03-18 DIAGNOSIS — Z23 Encounter for immunization: Secondary | ICD-10-CM

## 2016-03-18 DIAGNOSIS — Z1389 Encounter for screening for other disorder: Secondary | ICD-10-CM | POA: Diagnosis not present

## 2016-03-18 LAB — POCT URINALYSIS DIP (MANUAL ENTRY)
Bilirubin, UA: NEGATIVE
GLUCOSE UA: NEGATIVE
Ketones, POC UA: NEGATIVE
Leukocytes, UA: NEGATIVE
NITRITE UA: NEGATIVE
PH UA: 5.5
PROTEIN UA: NEGATIVE
RBC UA: NEGATIVE
SPEC GRAV UA: 1.015
UROBILINOGEN UA: 0.2

## 2016-03-18 MED ORDER — HYDROCHLOROTHIAZIDE 25 MG PO TABS: 25.0000 mg | ORAL_TABLET | Freq: Every day | ORAL | 5 refills | Status: DC

## 2016-03-18 NOTE — Patient Instructions (Addendum)
   IF you received an x-ray today, you will receive an invoice from [facility]. Please contact [facility] at [phone] with questions or concerns regarding your invoice.   IF you received labwork today, you will receive an invoice from [facility]. Please contact [facility] at [phone] with questions or concerns regarding your invoice.   Our billing staff will not be able to assist you with questions regarding bills from these companies.  You will be contacted with the lab results as soon as they are available. The fastest way to get your results is to activate your My Chart account. Instructions are located on the last page of this paperwork. If you have not heard from us regarding the results in 2 weeks, please contact this office.      Bone Health Bones protect organs, store calcium, and anchor muscles. Good health habits, such as eating nutritious foods and exercising regularly, are important for maintaining healthy bones. They can also help to prevent a condition that causes bones to lose density and become weak and brittle (osteoporosis). Why is bone mass important? Bone mass refers to the amount of bone tissue that you have. The higher your bone mass, the stronger your bones. An important step toward having healthy bones throughout life is to have strong and dense bones during childhood. A young adult who has a high bone mass is more likely to have a high bone mass later in life. Bone mass at its greatest it is called peak bone mass. A large decline in bone mass occurs in older adults. In women, it occurs about the time of menopause. During this time, it is important to practice good health habits, because if more bone is lost than what is replaced, the bones will become less healthy and more likely to break (fracture). If you find that you have a low bone mass, you may be able to prevent osteoporosis or further bone loss by changing your diet and lifestyle. How can  I find out if my bone mass is low? Bone mass can be measured with an X-ray test that is called a bone mineral density (BMD) test. This test is recommended for all women who are age 65 or older. It may also be recommended for men who are age 70 or older, or for people who are more likely to develop osteoporosis due to:  Having bones that break easily.  Having a long-term disease that weakens bones, such as kidney disease or rheumatoid arthritis.  Having menopause earlier than normal.  Taking medicine that weakens bones, such as steroids, thyroid hormones, or hormone treatment for breast cancer or prostate cancer.  Smoking.  Drinking three or more alcoholic drinks each day. What are the nutritional recommendations for healthy bones? To have healthy bones, you need to get enough of the right minerals and vitamins. Most nutrition experts recommend getting these nutrients from the foods that you eat. Nutritional recommendations vary from person to person. Ask your health care provider what is healthy for you. Here are some general guidelines. Calcium Recommendations  Calcium is the most important (essential) mineral for bone health. Most people can get enough calcium from their diet, but supplements may be recommended for people who are at risk for osteoporosis. Good sources of calcium include:  Dairy products, such as low-fat or nonfat milk, cheese, and yogurt.  Dark green leafy vegetables, such as bok choy and broccoli.  Calcium-fortified foods, such as orange juice, cereal, bread, soy beverages, and tofu products.    Nuts, such as almonds. Follow these recommended amounts for daily calcium intake:  Children, age 1?3: 700 mg.  Children, age 4?8: 1,000 mg.  Children, age 9?13: 1,300 mg.  Teens, age 14?18: 1,300 mg.  Adults, age 19?50: 1,000 mg.  Adults, age 51?70:  Men: 1,000 mg.  Women: 1,200 mg.  Adults, age 71 or older: 1,200 mg.  Pregnant and breastfeeding  females:  Teens: 1,300 mg.  Adults: 1,000 mg. Vitamin D Recommendations  Vitamin D is the most essential vitamin for bone health. It helps the body to absorb calcium. Sunlight stimulates the skin to make vitamin D, so be sure to get enough sunlight. If you live in a cold climate or you do not get outside often, your health care provider may recommend that you take vitamin D supplements. Good sources of vitamin D in your diet include:  Egg yolks.  Saltwater fish.  Milk and cereal fortified with vitamin D. Follow these recommended amounts for daily vitamin D intake:  Children and teens, age 1?18: 600 international units.  Adults, age 50 or younger: 400-800 international units.  Adults, age 51 or older: 800-1,000 international units. Other Nutrients  Other nutrients for bone health include:  Phosphorus. This mineral is found in meat, poultry, dairy foods, nuts, and legumes. The recommended daily intake for adult men and adult women is 700 mg.  Magnesium. This mineral is found in seeds, nuts, dark green vegetables, and legumes. The recommended daily intake for adult men is 400?420 mg. For adult women, it is 310?320 mg.  Vitamin K. This vitamin is found in green leafy vegetables. The recommended daily intake is 120 mg for adult men and 90 mg for adult women. What type of physical activity is best for building and maintaining healthy bones? Weight-bearing and strength-building activities are important for building and maintaining peak bone mass. Weight-bearing activities cause muscles and bones to work against gravity. Strength-building activities increases muscle strength that supports bones. Weight-bearing and muscle-building activities include:  Walking and hiking.  Jogging and running.  Dancing.  Gym exercises.  Lifting weights.  Tennis and racquetball.  Climbing stairs.  Aerobics. Adults should get at least 30 minutes of moderate physical activity on most days. Children  should get at least 60 minutes of moderate physical activity on most days. Ask your health care provide what type of exercise is best for you. Where can I find more information? For more information, check out the following websites:  National Osteoporosis Foundation: http://nof.org/learn/basics  National Institutes of Health: http://www.niams.nih.gov/Health_Info/Bone/Bone_Health/bone_health_for_life.asp This information is not intended to replace advice given to you by your health care provider. Make sure you discuss any questions you have with your health care provider. Document Released: 03/27/2003 Document Revised: 07/25/2015 Document Reviewed: 01/09/2014 Elsevier Interactive Patient Education  2017 [employer]  

## 2016-03-23 ENCOUNTER — Telehealth: Payer: Self-pay | Admitting: *Deleted

## 2016-03-23 NOTE — Telephone Encounter (Signed)
Received referral for initial lung cancer screening scan. Contacted patient and obtained smoking history,(former smoker, quit 07/15/15 with a 47 pack year history) as well as answering questions related to screening process. Patient prefers to have shared decision making visit and CT scan in [place]. Information will be forwarded to Eric Form NP.

## 2016-03-24 LAB — LIPID PANEL
CHOLESTEROL TOTAL: 175 mg/dL (ref 100–199)
Chol/HDL Ratio: 2.2 ratio units (ref 0.0–4.4)
HDL: 81 mg/dL (ref >39)
LDL Calculated: 82 mg/dL (ref 0–99)
TRIGLYCERIDES: 60 mg/dL (ref 0–149)
VLDL CHOLESTEROL CAL: 12 mg/dL (ref 5–40)

## 2016-03-24 LAB — COMPREHENSIVE METABOLIC PANEL
A/G RATIO: 1.5 (ref 1.2–2.2)
ALBUMIN: 4.4 g/dL (ref 3.6–4.8)
ALK PHOS: 58 IU/L (ref 39–117)
ALT: 43 IU/L — ABNORMAL HIGH (ref 0–32)
AST: 32 IU/L (ref 0–40)
BILIRUBIN TOTAL: 0.6 mg/dL (ref 0.0–1.2)
BUN / CREAT RATIO: 9 — AB (ref 12–28)
BUN: 5 mg/dL — ABNORMAL LOW (ref 8–27)
CHLORIDE: 97 mmol/L (ref 96–106)
CO2: 25 mmol/L (ref 18–29)
Calcium: 9.1 mg/dL (ref 8.7–10.3)
Creatinine, Ser: 0.57 mg/dL (ref 0.57–1.00)
GFR calc Af Amer: 115 mL/min/{1.73_m2} (ref >59)
GFR calc non Af Amer: 100 mL/min/{1.73_m2} (ref >59)
GLOBULIN, TOTAL: 3 g/dL (ref 1.5–4.5)
Glucose: 91 mg/dL (ref 65–99)
POTASSIUM: 4.1 mmol/L (ref 3.5–5.2)
SODIUM: 138 mmol/L (ref 134–144)
Total Protein: 7.4 g/dL (ref 6.0–8.5)

## 2016-03-24 LAB — CBC
Hematocrit: 37.6 % (ref 34.0–46.6)
Hemoglobin: 12.6 g/dL (ref 11.1–15.9)
MCH: 30 pg (ref 26.6–33.0)
MCHC: 33.5 g/dL (ref 31.5–35.7)
MCV: 90 fL (ref 79–97)
PLATELETS: 282 10*3/uL (ref 150–379)
RBC: 4.2 x10E6/uL (ref 3.77–5.28)
RDW: 13.4 % (ref 12.3–15.4)
WBC: 6.7 10*3/uL (ref 3.4–10.8)

## 2016-03-24 LAB — HCV RNA NAA QUALITATIVE: HCV RNA NAA QUALITATIVE: POSITIVE — AB

## 2016-03-24 LAB — HCV AB W/RFLX TO VERIFICATION

## 2016-03-24 LAB — HIV ANTIBODY (ROUTINE TESTING W REFLEX): HIV Screen 4th Generation wRfx: NONREACTIVE

## 2016-03-24 LAB — TSH: TSH: 1.59 u[IU]/mL (ref 0.450–4.500)

## 2016-03-24 LAB — INTERPRETATION:

## 2016-04-01 ENCOUNTER — Other Ambulatory Visit: Payer: Self-pay | Admitting: Acute Care

## 2016-04-01 DIAGNOSIS — Z87891 Personal history of nicotine dependence: Secondary | ICD-10-CM

## 2016-04-14 ENCOUNTER — Encounter: Payer: Self-pay | Admitting: Acute Care

## 2016-04-14 ENCOUNTER — Inpatient Hospital Stay: Admission: RE | Admit: 2016-04-14 | Payer: Self-pay | Source: Ambulatory Visit

## 2016-04-26 ENCOUNTER — Encounter: Payer: Self-pay | Admitting: Acute Care

## 2016-04-26 ENCOUNTER — Ambulatory Visit (INDEPENDENT_AMBULATORY_CARE_PROVIDER_SITE_OTHER): Payer: BLUE CROSS/BLUE SHIELD | Admitting: Acute Care

## 2016-04-26 ENCOUNTER — Ambulatory Visit (INDEPENDENT_AMBULATORY_CARE_PROVIDER_SITE_OTHER)
Admission: RE | Admit: 2016-04-26 | Discharge: 2016-04-26 | Disposition: A | Discharge status: Home / Self Care | Payer: BLUE CROSS/BLUE SHIELD | Source: Ambulatory Visit | Attending: Acute Care | Admitting: Acute Care

## 2016-04-26 DIAGNOSIS — Z87891 Personal history of nicotine dependence: Secondary | ICD-10-CM | POA: Diagnosis not present

## 2016-04-26 NOTE — Progress Notes (Signed)
Shared Decision Making Visit Lung Cancer Screening Program [phone])   Eligibility:  Age 61 y.o. Pack Years Smoking History Calculation 47 pack year smoking hiostory (# packs/per year x # years smoked)  Recent History of coughing up blood  no  Unexplained weight loss? no ( >Than 15 pounds within the last 6 months )  Prior History Lung / other cancer no (Diagnosis within the last 5 years already requiring surveillance chest CT Scans).  Smoking Status Former Smoker  Former Smokers: Years since quit:< 1 year  Quit Date: 07/15/2015  Visit Components:  Discussion included one or more decision making aids. yes  Discussion included risk/benefits of screening. yes  Discussion included potential follow up diagnostic testing for abnormal scans. yes  Discussion included meaning and risk of over diagnosis. yes  Discussion included meaning and risk of False Positives. yes  Discussion included meaning of total radiation exposure. yes  Counseling Included:  Importance of adherence to annual lung cancer LDCT screening. yes  Impact of comorbidities on ability to participate in the program. yes  Ability and willingness to under diagnostic treatment. yes  Smoking Cessation Counseling:  Current Smokers:   Discussed importance of smoking cessation. yes  Information about tobacco cessation classes and interventions provided to patient. yes  Patient provided with "ticket" for LDCT Scan. yes  Symptomatic Patient. no  Counseling  Diagnosis Code: Tobacco Use Z72.0  Asymptomatic Patient yes  Counseling (Intermediate counseling: > three minutes counseling) Y6948  Former Smokers:   Discussed the importance of maintaining cigarette abstinence. yes  Diagnosis Code: Personal History of Nicotine Dependence. N46.270  Information about tobacco cessation classes and interventions provided to patient. Yes  Patient provided with "ticket" for LDCT Scan. yes  Written Order for Lung  Cancer Screening with LDCT placed in Epic. Yes (CT Chest Lung Cancer Screening Low Dose W/O CM) [id number] Z12.2-Screening of respiratory organs Z87.891-Personal history of nicotine dependence  I spent 25 minutes of face to face time with Ms. Ivanoff discussing the risks and benefits of lung cancer screening. We viewed a power point together that explained in detail the above noted topics. We took the time to pause the power point at intervals to allow for questions to be asked and answered to ensure understanding. We discussed that she  had taken the single most powerful action possible to decrease her risk of developing lung cancer when she quit smoking. I counseled her to remain smoke free, and to contact me if she ever had the desire to smoke again so that I can provide resources and tools to help support the effort to remain smoke free. We discussed the time and location of the scan, and that either Doroteo Glassman, RN  or I will call with the results within  24-48 hours of receiving them. She  has my card and contact information in the event she needs to speak with me, in addition to a copy of the power point we reviewed as a resource. Ms. Stotts  verbalized understanding of all of the above and had no further questions upon leaving the office.   I spent 3 minutes counseling on continued smoking abstinence this visit.  I explained that there has been a high incidence of CAD noted on this screening scan. I explained that it is a non-gated exam, and that the degree or severity of CAD cannot be determined.Ms. Riccobono is currently not on statin therapy. She stated that she does have her cholesterol checked on a regular basis by her PCP.  I explained that we will fax scan results to her PCP for follow up of any non-pulmonary incidental findings.She verbalized understanding of the above.   Magdalen Spatz, NP 04/26/2016

## 2016-04-28 ENCOUNTER — Other Ambulatory Visit: Payer: Self-pay | Admitting: Acute Care

## 2016-04-28 ENCOUNTER — Ambulatory Visit (AMBULATORY_SURGERY_CENTER): Payer: Self-pay | Admitting: *Deleted

## 2016-04-28 VITALS — Ht 61.0 in | Wt 144.0 lb

## 2016-04-28 DIAGNOSIS — Z1211 Encounter for screening for malignant neoplasm of colon: Secondary | ICD-10-CM

## 2016-04-28 DIAGNOSIS — Z87891 Personal history of nicotine dependence: Secondary | ICD-10-CM

## 2016-04-28 MED ORDER — NA SULFATE-K SULFATE-MG SULF 17.5-3.13-1.6 GM/177ML PO SOLN: 1.0000 | Freq: Once | ORAL | 0 refills | Status: AC

## 2016-04-28 NOTE — Progress Notes (Signed)
No egg or soy allergy known to patient  No issues with past sedation with any surgeries  or procedures, no intubation problems  No diet pills per patient No home 02 use per patient  No blood thinners per patient  Pt denies issues with constipation  No A fib or A flutter  emmi video to e mail  Pay no more than$50 coupon to pt for suprep

## 2016-04-29 ENCOUNTER — Encounter: Payer: Self-pay | Admitting: Gastroenterology

## 2016-05-11 ENCOUNTER — Ambulatory Visit (AMBULATORY_SURGERY_CENTER): Payer: BLUE CROSS/BLUE SHIELD | Admitting: Gastroenterology

## 2016-05-11 ENCOUNTER — Encounter: Payer: Self-pay | Admitting: Gastroenterology

## 2016-05-11 VITALS — BP 127/73 | HR 68 | Temp 98.4°F | Resp 14 | Ht 61.0 in | Wt 144.0 lb

## 2016-05-11 DIAGNOSIS — Z1212 Encounter for screening for malignant neoplasm of rectum: Secondary | ICD-10-CM

## 2016-05-11 DIAGNOSIS — D128 Benign neoplasm of rectum: Secondary | ICD-10-CM

## 2016-05-11 DIAGNOSIS — D122 Benign neoplasm of ascending colon: Secondary | ICD-10-CM | POA: Diagnosis not present

## 2016-05-11 DIAGNOSIS — D120 Benign neoplasm of cecum: Secondary | ICD-10-CM

## 2016-05-11 DIAGNOSIS — Z1211 Encounter for screening for malignant neoplasm of colon: Secondary | ICD-10-CM | POA: Diagnosis not present

## 2016-05-11 DIAGNOSIS — K621 Rectal polyp: Secondary | ICD-10-CM | POA: Diagnosis not present

## 2016-05-11 MED ORDER — SODIUM CHLORIDE 0.9 % IV SOLN: 500.0000 mL | INTRAVENOUS | Status: DC

## 2016-05-11 NOTE — Progress Notes (Signed)
Pt's states no medical or surgical changes since previsit or office visit. 

## 2016-05-11 NOTE — Op Note (Addendum)
[facility] Patient Name: Brittany Summers Procedure Date: 05/11/2016 8:36 AM MRN: [id number] Endoscopist: Mauri Pole , MD Age: 61 Referring MD:  Date of Birth: 03/28/1955 Gender: Female Account #: [id number] Procedure:                Colonoscopy Indications:              Screening for colorectal malignant neoplasm, This                            is the patient's first colonoscopy Medicines:                Monitored Anesthesia Care Procedure:                Pre-Anesthesia Assessment:                           - Prior to the procedure, a History and Physical                            was performed, and patient medications and                            allergies were reviewed. The patient's tolerance of                            previous anesthesia was also reviewed. The risks                            and benefits of the procedure and the sedation                            options and risks were discussed with the patient.                            All questions were answered, and informed consent                            was obtained. Prior Anticoagulants: The patient has                            taken no previous anticoagulant or antiplatelet                            agents. ASA Grade Assessment: II - A patient with                            mild systemic disease. After reviewing the risks                            and benefits, the patient was deemed in                            satisfactory condition to undergo the procedure.  After obtaining informed consent, the colonoscope                            was passed under direct vision. Throughout the                            procedure, the patient's blood pressure, pulse, and                            oxygen saturations were monitored continuously. The                            Colonoscope was introduced through the anus and                            advanced to the the  terminal ileum, with                            identification of the appendiceal orifice and IC                            valve. The colonoscopy was performed without                            difficulty. The patient tolerated the procedure                            well. The quality of the bowel preparation was                            excellent. The terminal ileum, ileocecal valve,                            appendiceal orifice, and rectum were photographed. Scope In: 8:43:22 AM Scope Out: 9:01:30 AM Scope Withdrawal Time: 0 hours 11 minutes 33 seconds  Total Procedure Duration: 0 hours 18 minutes 8 seconds  Findings:                 The perianal and digital rectal examinations were                            normal.                           A 9 mm polyp was found in the cecum. The polyp was                            multi-lobulated. The polyp was removed with a cold                            snare. Resection and retrieval were complete.                           Five sessile polyps were found in the rectum,  ascending colon and cecum. The polyps were 1 to 3                            mm in size. These polyps were removed with a cold                            biopsy forceps. Resection and retrieval were                            complete.                           A few small-mouthed diverticula were found in the                            sigmoid colon.                           Non-bleeding internal hemorrhoids were found during                            retroflexion. The hemorrhoids were small. Complications:            No immediate complications. Estimated Blood Loss:     Estimated blood loss was minimal. Impression:               - One 9 mm polyp in the cecum, removed with a cold                            snare. Resected and retrieved.                           - Five 1 to 3 mm polyps in the rectum, in the                             ascending colon and in the cecum, removed with a                            cold biopsy forceps. Resected and retrieved.                           - Diverticulosis in the sigmoid colon.                           - Non-bleeding internal hemorrhoids. Recommendation:           - Patient has a contact number available for                            emergencies. The signs and symptoms of potential                            delayed complications were discussed with the  patient. Return to normal activities tomorrow.                            Written discharge instructions were provided to the                            patient.                           - Resume previous diet.                           - Continue present medications.                           - Await pathology results.                           - Repeat colonoscopy in 3 - 5 years for                            surveillance based on pathology results. Mauri Pole, MD 05/11/2016 9:09:02 AM This report has been signed electronically.

## 2016-05-11 NOTE — Progress Notes (Signed)
Called to room to assist during endoscopic procedure.  Patient ID and intended procedure confirmed with present staff. Received instructions for my participation in the procedure from the performing physician.  

## 2016-05-11 NOTE — Progress Notes (Signed)
A/ox3 pleased with MAC, report to Sheila RN 

## 2016-05-11 NOTE — Patient Instructions (Signed)
YOU HAD AN ENDOSCOPIC PROCEDURE TODAY AT THE [facility]:   Refer to the procedure report that was given to you for any specific questions about what was found during the examination.  If the procedure report does not answer your questions, please call your gastroenterologist to clarify.  If you requested that your care partner not be given the details of your procedure findings, then the procedure report has been included in a sealed envelope for you to review at your convenience later.  YOU SHOULD EXPECT: Some feelings of bloating in the abdomen. Passage of more gas than usual.  Walking can help get rid of the air that was put into your GI tract during the procedure and reduce the bloating. If you had a lower endoscopy (such as a colonoscopy or flexible sigmoidoscopy) you may notice spotting of blood in your stool or on the toilet paper. If you underwent a bowel prep for your procedure, you may not have a normal bowel movement for a few days.  Please Note:  You might notice some irritation and congestion in your nose or some drainage.  This is from the oxygen used during your procedure.  There is no need for concern and it should clear up in a day or so.  SYMPTOMS TO REPORT IMMEDIATELY:   Following lower endoscopy (colonoscopy or flexible sigmoidoscopy):  Excessive amounts of blood in the stool  Significant tenderness or worsening of abdominal pains  Swelling of the abdomen that is new, acute  Fever of 100F or higher   For urgent or emergent issues, a gastroenterologist can be reached at any hour by calling [phone].   DIET:  We do recommend a small meal at first, but then you may proceed to your regular diet.  Drink plenty of fluids but you should avoid alcoholic beverages for 24 hours.  ACTIVITY:  You should plan to take it easy for the rest of today and you should NOT DRIVE or use heavy machinery until tomorrow (because of the sedation medicines used during the test).     FOLLOW UP: Our staff will call the number listed on your records the next business day following your procedure to check on you and address any questions or concerns that you may have regarding the information given to you following your procedure. If we do not reach you, we will leave a message.  However, if you are feeling well and you are not experiencing any problems, there is no need to return our call.  We will assume that you have returned to your regular daily activities without incident.  If any biopsies were taken you will be contacted by phone or by letter within the next 1-3 weeks.  Please call us at [phone] if you have not heard about the biopsies in 3 weeks.    SIGNATURES/CONFIDENTIALITY: You and/or your care partner have signed paperwork which will be entered into your electronic medical record.  These signatures attest to the fact that that the information above on your After Visit Summary has been reviewed and is understood.  Full responsibility of the confidentiality of this discharge information lies with you and/or your care-partner.   Resume medications. Information given on polyps,diverticulosis and hemorrhoids. 

## 2016-05-12 ENCOUNTER — Telehealth: Payer: Self-pay

## 2016-05-12 NOTE — Telephone Encounter (Signed)
  Follow up Call-  Call back number 05/11/2016  Post procedure Call Back phone  # [phone]  Permission to leave phone message Yes     Patient questions:  Do you have a fever, pain , or abdominal swelling? No. Pain Score  0 *  Have you tolerated food without any problems? Yes.    Have you been able to return to your normal activities? Yes.    Do you have any questions about your discharge instructions: Diet   No. Medications  No. Follow up visit  No.  Do you have questions or concerns about your Care? No.  Actions: * If pain score is 4 or above: No action needed, pain <4.  No problems noted per pt. maw

## 2016-05-14 ENCOUNTER — Encounter: Payer: Self-pay | Admitting: Gastroenterology

## 2016-06-21 ENCOUNTER — Ambulatory Visit (INDEPENDENT_AMBULATORY_CARE_PROVIDER_SITE_OTHER): Payer: BLUE CROSS/BLUE SHIELD | Admitting: Family Medicine

## 2016-06-21 ENCOUNTER — Encounter: Payer: Self-pay | Admitting: Family Medicine

## 2016-06-21 VITALS — BP 135/82 | HR 83 | Temp 98.6°F | Resp 18 | Ht 61.0 in | Wt 145.2 lb

## 2016-06-21 DIAGNOSIS — Z87891 Personal history of nicotine dependence: Secondary | ICD-10-CM | POA: Diagnosis not present

## 2016-06-21 DIAGNOSIS — B182 Chronic viral hepatitis C: Secondary | ICD-10-CM | POA: Diagnosis not present

## 2016-06-21 DIAGNOSIS — Z8619 Personal history of other infectious and parasitic diseases: Secondary | ICD-10-CM

## 2016-06-21 DIAGNOSIS — J432 Centrilobular emphysema: Secondary | ICD-10-CM | POA: Diagnosis not present

## 2016-06-21 DIAGNOSIS — I10 Essential (primary) hypertension: Secondary | ICD-10-CM | POA: Insufficient documentation

## 2016-06-21 LAB — PULMONARY FUNCTION TEST

## 2016-06-21 MED ORDER — HYDROCHLOROTHIAZIDE 25 MG PO TABS: 25.0000 mg | ORAL_TABLET | Freq: Every day | ORAL | 3 refills | Status: DC

## 2016-06-21 NOTE — Progress Notes (Signed)
Subjective:    Patient ID: Brittany Summers, female    DOB: 1955/10/15, 61 y.o.   MRN: [id number]  HPI Brittany Summers is a 61 yo woman here for a 3 mos follow-up visit after her CPE.  She had her colonoscopy with Dr. Silverio Decamp 6 wks ago.  Did find quite a few polyps so need to repeat in 3 years. Does have + FHx. Brittany Summers and Brittany Summers had stomach cancer. (Pt's mother is deceased as she had complications from colostomy and following ischemic bowel.  HTN: At wellness visit 3 months ago patient states she had noticed that her blood pressure had been gradually increasing over the prior 6 months from 118/80s up to 140s/80s so patient was started on HCTZ 25 mg. Did an EKG at her last wellness visit 3 months ago but it was not loaded into the system though marked completed and I did see a hard copy so need to repeat today so we can have a prior EKG available in Epic. BP yest was 121/78 at home. Had coffee this morning/ She is taking K.   Had her screening lung CT 2 mos ago due to h/o tob use.  Stopped smoking almost one year ago now after 3 months of Chantix but had 15 pound weight gain which has levels off but she is trying to eat more lean meat and veggies. DOes do some cottage cheese and younger.  Doe shave wrist/ankle weights with the walking and does hav e ahome steppers and can use with resistence. Tries to get out to walk some.  Does not take an asa 81mg  about 1x/wk.  No vag d/c. No vag sxs.   Review of Systems     Objective:   Physical Exam       BP 135/82 (BP Location: Right Arm, Patient Position: Sitting, Cuff Size: Normal)   Pulse 83   Temp 98.6 F (37 C) (Oral)   Resp 18   Ht 5\' 1"  (1.549 m)   Wt 145 lb 3.2 oz (65.9 kg)   LMP  (LMP Unknown)   SpO2 96%   BMI 27.44 kg/m   Assessment & Plan:  Refer for hep C treatment, will get a viral load and genotype this was sent for ultrasound.\ Has mammogram scheduled for next month?  1. Essential hypertension   2. 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 History of trichomoniasis   4. Centrilobular emphysema ([place])   5. History of tobacco abuse     Orders Placed This Encounter  Procedures  . US Abdomen Limited RUQ    Standing Status:   Future    Standing Expiration Date:   08/21/2017    Order Specific Question:   Reason for Exam (SYMPTOM  OR DIAGNOSIS REQUIRED)    Answer:   new diagnosis chrnoic hepatitis C, eval liver    Order Specific Question:   Preferred imaging location?    Answer:   [facility]  . Comprehensive metabolic panel  . Hepatitis C Genotype  . HCV RNA quant  . AMB referral to hepatitis C clinic    Referral Priority:   Routine    Referral Type:   Consultation    Number of Visits Requested:   1  . Care order/instruction:    Scheduling Instructions:     Spirometry IF NEB IS ORDERED PLEASE DO A SPIROMETRY BEFORE AND AFTER.  . Care order/instruction:    Scheduling Instructions:     Complete orders, AVS and go.  Marland Kitchen  EKG 12-Lead    Meds ordered this encounter  Medications  . hydrochlorothiazide (HYDRODIURIL) 25 MG tablet    Sig: Take 1 tablet (25 mg total) by mouth daily.    Dispense:  90 tablet    Refill:  3    \  [name], M.D.  Primary Care at [facility] [address] [place], [place] [address] [phone] phone [phone] fax  06/23/16 11:52 PM

## 2016-06-21 NOTE — Patient Instructions (Addendum)
We recommend that you schedule a mammogram for breast cancer screening. Typically, you do not need a referral to do this. Please contact a local imaging center to schedule your mammogram.  [facility] - [phone]  *ask for the Radiology Department [facility] - [phone] or [phone]  [facility] - [phone] [place] [phone] [facility] - [phone]  *ask for the [facility] - [phone]  *ask for the Radiology Department [facility] - [phone]  *ask for the [facility] - [phone]   IF you received an x-ray today, you will receive an invoice from [facility] Radiology. Please contact [facility] Radiology at [phone] with questions or concerns regarding your invoice.   IF you received labwork today, you will receive an invoice from [place]. Please contact [facility] at [phone] with questions or concerns regarding your invoice.   Our billing staff will not be able to assist you with questions regarding bills from these companies.  You will be contacted with the lab results as soon as they are available. The fastest way to get your results is to activate your My Chart account. Instructions are located on the last page of this paperwork. If you have not heard from [place] regarding the results in 2 weeks, please contact this office.     Chronic Obstructive Pulmonary Disease Chronic obstructive pulmonary disease (COPD) is a common lung condition in which airflow from the lungs is limited. COPD is a general term that can be used to describe many different lung problems that limit airflow, including both chronic bronchitis and emphysema. If you have COPD, your lung function will probably never return to normal, but there are measures you can take to improve lung function and make yourself feel better. What  are the causes?  Smoking (common).  Exposure to secondhand smoke.  Genetic problems.  Chronic inflammatory lung diseases or recurrent infections. What are the signs or symptoms?  Shortness of breath, especially with physical activity.  Deep, persistent (chronic) cough with a large amount of thick mucus.  Wheezing.  Rapid breaths (tachypnea).  Gray or bluish discoloration (cyanosis) of the skin, especially in your fingers, toes, or lips.  Fatigue.  Weight loss.  Frequent infections or episodes when breathing symptoms become much worse (exacerbations).  Chest tightness. How is this diagnosed? Your health care provider will take a medical history and perform a physical examination to diagnose COPD. Additional tests for COPD may include:  Lung (pulmonary) function tests.  Chest X-ray.  CT scan.  Blood tests.  How is this treated? Treatment for COPD may include:  Inhaler and nebulizer medicines. These help manage the symptoms of COPD and make your breathing more comfortable.  Supplemental oxygen. Supplemental oxygen is only helpful if you have a low oxygen level in your blood.  Exercise and physical activity. These are beneficial for nearly all people with COPD.  Lung surgery or transplant.  Nutrition therapy to gain weight, if you are underweight.  Pulmonary rehabilitation. This may involve working with a team of health care providers and specialists, such as respiratory, occupational, and physical therapists.  Follow these instructions at home:  Take all medicines (inhaled or pills) as directed by your health care provider.  Avoid over-the-counter medicines or cough syrups that dry up your airway (such as antihistamines) and slow down the elimination of secretions unless instructed otherwise by your health care provider.  If  you are a smoker, the most important thing that you can do is stop smoking. Continuing to smoke will cause further lung damage and  breathing trouble. Ask your health care provider for help with quitting smoking. He or she can direct you to community resources or hospitals that provide support.  Avoid exposure to irritants such as smoke, chemicals, and fumes that aggravate your breathing.  Use oxygen therapy and pulmonary rehabilitation if directed by your health care provider. If you require home oxygen therapy, ask your health care provider whether you should purchase a pulse oximeter to measure your oxygen level at home.  Avoid contact with individuals who have a contagious illness.  Avoid extreme temperature and humidity changes.  Eat healthy foods. Eating smaller, more frequent meals and resting before meals may help you maintain your strength.  Stay active, but balance activity with periods of rest. Exercise and physical activity will help you maintain your ability to do things you want to do.  Preventing infection and hospitalization is very important when you have COPD. Make sure to receive all the vaccines your health care provider recommends, especially the pneumococcal and influenza vaccines. Ask your health care provider whether you need a pneumonia vaccine.  Learn and use relaxation techniques to manage stress.  Learn and use controlled breathing techniques as directed by your health care provider. Controlled breathing techniques include: 1. Pursed lip breathing. Start by breathing in (inhaling) through your nose for 1 second. Then, purse your lips as if you were going to whistle and breathe out (exhale) through the pursed lips for 2 seconds. 2. Diaphragmatic breathing. Start by putting one hand on your abdomen just above your waist. Inhale slowly through your nose. The hand on your abdomen should move out. Then purse your lips and exhale slowly. You should be able to feel the hand on your abdomen moving in as you exhale.  Learn and use controlled coughing to clear mucus from your lungs. Controlled coughing is  a series of short, progressive coughs. The steps of controlled coughing are: 1. Lean your head slightly forward. 2. Breathe in deeply using diaphragmatic breathing. 3. Try to hold your breath for 3 seconds. 4. Keep your mouth slightly open while coughing twice. 5. Spit any mucus out into a tissue. 6. Rest and repeat the steps once or twice as needed. Contact a health care provider if:  You are coughing up more mucus than usual.  There is a change in the color or thickness of your mucus.  Your breathing is more labored than usual.  Your breathing is faster than usual. Get help right away if:  You have shortness of breath while you are resting.  You have shortness of breath that prevents you from: ? Being able to talk. ? Performing your usual physical activities.  You have chest pain lasting longer than 5 minutes.  Your skin color is more cyanotic than usual.  You measure low oxygen saturations for longer than 5 minutes with a pulse oximeter. This information is not intended to replace advice given to you by your health care provider. Make sure you discuss any questions you have with your health care provider. Document Released: 10/14/2004 Document Revised: 06/12/2015 Document Reviewed: 08/31/2012 Elsevier Interactive Patient Education  2017 [employer].

## 2016-06-23 LAB — HCV RNA QUANT
HCV log10: 6.766 log10 IU/mL
HEPATITIS C QUANTITATION: 5830000 [IU]/mL

## 2016-06-23 LAB — COMPREHENSIVE METABOLIC PANEL
A/G RATIO: 1.6 (ref 1.2–2.2)
ALBUMIN: 4.5 g/dL (ref 3.6–4.8)
ALT: 41 IU/L — ABNORMAL HIGH (ref 0–32)
AST: 34 IU/L (ref 0–40)
Alkaline Phosphatase: 65 IU/L (ref 39–117)
BILIRUBIN TOTAL: 0.5 mg/dL (ref 0.0–1.2)
BUN / CREAT RATIO: 22 (ref 12–28)
BUN: 12 mg/dL (ref 8–27)
CALCIUM: 10 mg/dL (ref 8.7–10.3)
CHLORIDE: 93 mmol/L — AB (ref 96–106)
CO2: 28 mmol/L (ref 18–29)
Creatinine, Ser: 0.54 mg/dL — ABNORMAL LOW (ref 0.57–1.00)
GFR, EST AFRICAN AMERICAN: 117 mL/min/{1.73_m2} (ref >59)
GFR, EST NON AFRICAN AMERICAN: 101 mL/min/{1.73_m2} (ref >59)
GLOBULIN, TOTAL: 2.9 g/dL (ref 1.5–4.5)
Glucose: 96 mg/dL (ref 65–99)
POTASSIUM: 4.1 mmol/L (ref 3.5–5.2)
Sodium: 136 mmol/L (ref 134–144)
TOTAL PROTEIN: 7.4 g/dL (ref 6.0–8.5)

## 2016-06-23 LAB — HEPATITIS C GENOTYPE

## 2016-07-07 ENCOUNTER — Other Ambulatory Visit: Payer: Self-pay | Admitting: Family Medicine

## 2016-07-07 DIAGNOSIS — Z1231 Encounter for screening mammogram for malignant neoplasm of breast: Secondary | ICD-10-CM

## 2016-07-12 ENCOUNTER — Ambulatory Visit
Admission: RE | Admit: 2016-07-12 | Discharge: 2016-07-12 | Disposition: A | Discharge status: Home / Self Care | Payer: BLUE CROSS/BLUE SHIELD | Source: Ambulatory Visit | Attending: Family Medicine | Admitting: Family Medicine

## 2016-07-12 DIAGNOSIS — B182 Chronic viral hepatitis C: Secondary | ICD-10-CM

## 2016-07-16 ENCOUNTER — Ambulatory Visit
Admission: RE | Admit: 2016-07-16 | Discharge: 2016-07-16 | Disposition: A | Discharge status: Home / Self Care | Payer: BLUE CROSS/BLUE SHIELD | Source: Ambulatory Visit | Attending: Family Medicine | Admitting: Family Medicine

## 2016-07-16 DIAGNOSIS — Z1231 Encounter for screening mammogram for malignant neoplasm of breast: Secondary | ICD-10-CM

## 2016-07-19 ENCOUNTER — Encounter: Payer: Self-pay | Admitting: Family Medicine

## 2016-09-22 ENCOUNTER — Ambulatory Visit (INDEPENDENT_AMBULATORY_CARE_PROVIDER_SITE_OTHER): Payer: BLUE CROSS/BLUE SHIELD | Admitting: Internal Medicine

## 2016-09-22 ENCOUNTER — Encounter: Payer: Self-pay | Admitting: Internal Medicine

## 2016-09-22 VITALS — BP 158/99 | HR 74 | Temp 98.1°F | Ht 61.0 in | Wt 145.0 lb

## 2016-09-22 DIAGNOSIS — B182 Chronic viral hepatitis C: Secondary | ICD-10-CM

## 2016-09-22 DIAGNOSIS — Z23 Encounter for immunization: Secondary | ICD-10-CM

## 2016-09-22 HISTORY — DX: Chronic viral hepatitis C: B18.2

## 2016-09-22 MED ORDER — LEDIPASVIR-SOFOSBUVIR 90-400 MG PO TABS: 1.0000 | ORAL_TABLET | Freq: Every day | ORAL | 2 refills | Status: DC

## 2016-09-22 NOTE — Patient Instructions (Signed)
Date 09/22/16  Dear Brittany Summers, As discussed in the [facility], your hepatitis C therapy will include the following medications:          Harvoni 90mg /400mg  tablet:           Take 1 tablet by mouth once daily   Please note that ALL MEDICATIONS WILL START ON THE SAME DATE for a total of 12 weeks. ---------------------------------------------------------------- Your HCV Treatment Start Date: TBA   Your HCV genotype:  1a    Liver Fibrosis: TBD    ---------------------------------------------------------------- YOUR PHARMACY CONTACT:   [facility] [address] [place],  [address] Phone: [phone] Hours: Monday to Friday 7:30 am to 6:00 pm   Please always contact your pharmacy at least 3-4 business days before you run out of medications to ensure your next month's medication is ready or 1 week prior to running out if you receive it by mail.  Remember, each prescription is for 28 days. ---------------------------------------------------------------- GENERAL NOTES REGARDING YOUR HEPATITIS C MEDICATION:  SOFOSBUVIR/LEDIPASVIR (HARVONI): - Harvoni tablet is taken daily with OR without food. - The tablets are orange. - The tablets should be stored at room temperature.  - Acid reducing agents such as H2 blockers (ie. Pepcid (famotidine), Zantac (ranitidine), Tagamet (cimetidine), Axid (nizatidine) and proton pump inhibitors (ie. Prilosec (omeprazole), Protonix (pantoprazole), Nexium (esomeprazole), or Aciphex (rabeprazole)) can decrease effectiveness of Harvoni. Do not take until you have discussed with a health care provider.    -Antacids that contain magnesium and/or aluminum hydroxide (ie. Milk of Magensia, Rolaids, Gaviscon, Maalox, Mylanta, an dArthritis Pain Formula)can reduce absorption of Harvoni, so take them at least 4 hours before or after Harvoni.  -Calcium carbonate (calcium supplements or antacids such as Tums, Caltrate, Os-Cal)needs to be taken  at least 4 hours hours before or after Harvoni.  -St. John's wort or any products that contain St. John's wort like some herbal supplements  Please inform the office prior to starting any of these medications.  - The common side effects associated with Harvoni include:      1. Fatigue      2. Headache      3. Nausea      4. Diarrhea      5. Insomnia  Please note that this only lists the most common side effects and is NOT a comprehensive list of the potential side effects of these medications. For more information, please review the drug information sheets that come with your medication package from the pharmacy.  ---------------------------------------------------------------- GENERAL HELPFUL HINTS ON HCV THERAPY: 1. Stay well-hydrated. 2. Notify the [facility] of any changes in your other over-the-counter/herbal or prescription medications. 3. If you miss a dose of your medication, take the missed dose as soon as you remember. Return to your regular time/dose schedule the next day.  4.  Do not stop taking your medications without first talking with your healthcare provider. 5.  You may take Tylenol (acetaminophen), as long as the dose is less than 2000 mg (OR no more than 4 tablets of the Tylenol Extra Strengths 500mg  tablet) in 24 hours. 6.  You will see our pharmacist-specialist within the first 2 weeks of starting your medication to monitor for any possible side effects. 7.  You will have labs once during treatment, after soon after treatment completion and one final lab 6 months after treatment completion to verify the virus is out of your system.  Brittany Summers, [place] [facility] [place]  [facility] [place], [place]  [id number] [phone]

## 2016-09-22 NOTE — Progress Notes (Signed)
[facility] for Infectious Disease   CC: consideration for treatment for chronic hepatitis C  HPI:  +Brittany Summers is a 61 y.o. female who presents for initial evaluation and management of chronic hepatitis C.  Patient tested positive earlier this year during routine screning. 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 Patient denies history of blood transfusion, IV drug abuse, multiple sexual partners, sexual contact with person with liver disease, tattoos. Patient has had other studies performed. Results: hepatitis C RNA by PCR, result: positive. Patient has not had prior treatment for Hepatitis C. Patient does not have a past history of liver disease. Patient does not have a family history of liver disease. Patient does not  have associated signs or symptoms related to liver disease.  Labs reviewed and confirm chronic hepatitis C with a positive viral load with genotype 1a.   Records reviewed from Epic and was tested in June.      Patient does not have documented immunity to Hepatitis A. Patient does not have documented immunity to Hepatitis B.    Review of Systems:   Constitutional: negative for fatigue and malaise Gastrointestinal: negative for diarrhea Integument/breast: negative for rash Musculoskeletal: negative for myalgias and arthralgias All other systems reviewed and are negative       Past Medical History:  Diagnosis Date  . Hypertension     Prior to Admission medications   Medication Sig Start Date End Date Taking? Authorizing Provider  cholecalciferol (VITAMIN D) 1000 units tablet Take 1,000 Units by mouth daily.    [provider]  hydrochlorothiazide (HYDRODIURIL) 25 MG tablet Take 1 tablet (25 mg total) by mouth daily. 06/21/16   Shawnee Knapp, MD  Potassium 99 MG TABS Take by mouth daily.    [provider]    Allergies  Allergen Reactions  . Penicillins     Social History  Substance Use Topics  . Smoking  status: Former Smoker    Types: Cigarettes    Quit date: 07/15/2015  . Smokeless tobacco: Never Used  . Alcohol use No     Comment: as of 04-28-16 no alcohol     Family History  Problem Relation Age of Onset  . Hypertension Mother   . Breast cancer Mother   . Hypertension Father   . Diabetes Father   . Cancer Father   . Leukemia Father   . Breast cancer Maternal Grandmother   . Stomach cancer Maternal Grandmother   . Colon cancer Neg Hx   . Colon polyps Neg Hx   . Rectal cancer Neg Hx   . Esophageal cancer Neg Hx      Objective:  Constitutional: in no apparent distress,  Vitals:   09/22/16 0845  Weight: 145 lb (65.8 kg)  Height: 5\' 1"  (1.549 m)   Eyes: anicteric Cardiovascular: Cor RRR Respiratory: CTA B; normal respiratory effort Gastrointestinal: Bowel sounds are normal, liver is not enlarged, spleen is not enlarged Musculoskeletal: no pedal edema noted Skin: negatives: no rash; no porphyria cutanea tarda Lymphatic: no cervical lymphadenopathy   Laboratory Genotype: No results found for: HCVGENOTYPE HCV viral load: No results found for: HCVQUANT Lab Results  Component Value Date   WBC 6.7 03/18/2016   HGB 12.6 03/18/2016   HCT 37.6 03/18/2016   MCV 90 03/18/2016   PLT 282 03/18/2016    Lab Results  Component Value Date   CREATININE 0.54 (L) 06/21/2016   BUN 12 06/21/2016   NA 136 06/21/2016  K 4.1 06/21/2016   CL 93 (L) 06/21/2016   CO2 28 06/21/2016    Lab Results  Component Value Date   ALT 41 (H) 06/21/2016   AST 34 06/21/2016   ALKPHOS 65 06/21/2016     Labs and history reviewed and show CHILD-PUGH Unknown  5-6 points: Child class A 7-9 points: Child class B 10-15 points: Child class C  Lab Results  Component Value Date   BILITOT 0.5 06/21/2016   ALBUMIN 4.5 06/21/2016     Assessment: New Patient with Chronic Hepatitis C genotype 1a, untreated.  I discussed with the patient the lab findings that confirm chronic hepatitis C as well  as the natural history and progression of disease including about 30% of people who develop cirrhosis of the liver if left untreated and once cirrhosis is established there is a 2-7% risk per year of liver cancer and liver failure.  I discussed the importance of treatment and benefits in reducing the risk, even if significant liver fibrosis exists.   Plan: 1) Patient counseled extensively on limiting acetaminophen to no more than 2 grams daily, avoidance of alcohol. 2) Transmission discussed with patient including sexual transmission, sharing razors and toothbrush.   3) Will need referral to gastroenterology if concern for cirrhosis 4) Will need referral for substance abuse counseling: No.; Further work up to include urine drug screen  No. 5) Will prescribe Harvoni for 12 weeks today 6) Hepatitis A and B titers today 8) Pneumovax vaccine today 9) Further work up to include liver staging with elastography 10) will follow up after starting medication

## 2016-09-25 LAB — LIVER FIBROSIS, FIBROTEST-ACTITEST
ALPHA-2-MACROGLOBULIN: 331 mg/dL — AB (ref 106–279)
ALT: 37 U/L — ABNORMAL HIGH (ref 6–29)
Apolipoprotein A1: 189 mg/dL (ref 101–198)
BILIRUBIN: 0.4 mg/dL (ref 0.2–1.2)
FIBROSIS SCORE: 0.31
GGT: 48 U/L (ref 3–65)
Haptoglobin: 156 mg/dL (ref 43–212)
NECROINFLAMMAT ACT SCORE: 0.2
REFERENCE ID: 2098224

## 2016-09-28 LAB — CBC WITH DIFFERENTIAL/PLATELET
BASOS ABS: 43 {cells}/uL (ref 0–200)
Basophils Relative: 0.7 %
EOS ABS: 122 {cells}/uL (ref 15–500)
Eosinophils Relative: 2 %
HCT: 38.6 % (ref 35.0–45.0)
Hemoglobin: 13 g/dL (ref 11.7–15.5)
Lymphs Abs: 2068 cells/uL (ref 850–3900)
MCH: 30 pg (ref 27.0–33.0)
MCHC: 33.7 g/dL (ref 32.0–36.0)
MCV: 89.1 fL (ref 80.0–100.0)
MONOS PCT: 11 %
MPV: 9.8 fL (ref 7.5–12.5)
Neutro Abs: 3196 cells/uL (ref 1500–7800)
Neutrophils Relative %: 52.4 %
PLATELETS: 302 10*3/uL (ref 140–400)
RBC: 4.33 10*6/uL (ref 3.80–5.10)
RDW: 11.7 % (ref 11.0–15.0)
TOTAL LYMPHOCYTE: 33.9 %
WBC: 6.1 10*3/uL (ref 3.8–10.8)
WBCMIX: 671 {cells}/uL (ref 200–950)

## 2016-09-28 LAB — COMPLETE METABOLIC PANEL WITH GFR
AG RATIO: 1.6 (calc) (ref 1.0–2.5)
ALKALINE PHOSPHATASE (APISO): 63 U/L (ref 33–130)
ALT: 38 U/L — AB (ref 6–29)
AST: 28 U/L (ref 10–35)
Albumin: 4.5 g/dL (ref 3.6–5.1)
BILIRUBIN TOTAL: 0.7 mg/dL (ref 0.2–1.2)
BUN/Creatinine Ratio: 22 (calc) (ref 6–22)
BUN: 10 mg/dL (ref 7–25)
CALCIUM: 9.9 mg/dL (ref 8.6–10.4)
CHLORIDE: 96 mmol/L — AB (ref 98–110)
CO2: 30 mmol/L (ref 20–32)
Creat: 0.45 mg/dL — ABNORMAL LOW (ref 0.50–0.99)
GFR, EST NON AFRICAN AMERICAN: 107 mL/min/{1.73_m2} (ref >=60)
GFR, Est African American: 124 mL/min/{1.73_m2} (ref >=60)
GLOBULIN: 2.9 g/dL (ref 1.9–3.7)
Glucose, Bld: 104 mg/dL — ABNORMAL HIGH (ref 65–99)
Potassium: 4 mmol/L (ref 3.5–5.3)
SODIUM: 137 mmol/L (ref 135–146)
Total Protein: 7.4 g/dL (ref 6.1–8.1)

## 2016-09-28 LAB — HCV RNA,QN PCR RFLX GENO, LIPABAD
HCV RNA, PCR, QN (Log): 6.85 log IU/mL — ABNORMAL HIGH
HCV RNA, PCR, QN: 7040000 [IU]/mL — AB

## 2016-09-28 LAB — HEPATITIS A ANTIBODY, TOTAL: HEPATITIS A AB,TOTAL: NONREACTIVE

## 2016-09-28 LAB — HEPATITIS B CORE ANTIBODY, TOTAL: HEP B C TOTAL AB: NONREACTIVE

## 2016-09-28 LAB — HEPATITIS C GENOTYPE

## 2016-09-28 LAB — HEPATITIS B SURFACE ANTIGEN: HEP B S AG: NONREACTIVE

## 2016-09-28 LAB — HEPATITIS B SURFACE ANTIBODY,QUALITATIVE: HEP B S AB: NONREACTIVE

## 2016-09-28 LAB — PROTIME-INR
INR: 1
Prothrombin Time: 10.3 s (ref 9.0–11.5)

## 2016-09-29 ENCOUNTER — Encounter: Payer: Self-pay | Admitting: Pharmacy Technician

## 2016-09-29 MED FILL — HARVONI 90-400 MG TABLET: 90-400 | 28 days supply | Qty: 28 | Fill #0

## 2016-10-06 ENCOUNTER — Ambulatory Visit: Payer: BLUE CROSS/BLUE SHIELD | Admitting: Physician Assistant

## 2016-10-11 ENCOUNTER — Ambulatory Visit (INDEPENDENT_AMBULATORY_CARE_PROVIDER_SITE_OTHER): Payer: BLUE CROSS/BLUE SHIELD | Admitting: Physician Assistant

## 2016-10-11 DIAGNOSIS — Z23 Encounter for immunization: Secondary | ICD-10-CM | POA: Diagnosis not present

## 2016-10-12 ENCOUNTER — Encounter: Payer: Self-pay | Admitting: Pharmacist

## 2016-10-12 ENCOUNTER — Other Ambulatory Visit: Payer: Self-pay | Admitting: Pharmacist

## 2016-10-12 ENCOUNTER — Ambulatory Visit: Payer: BLUE CROSS/BLUE SHIELD | Admitting: Physician Assistant

## 2016-10-12 DIAGNOSIS — B182 Chronic viral hepatitis C: Secondary | ICD-10-CM

## 2016-10-12 MED ORDER — LEDIPASVIR-SOFOSBUVIR 90-400 MG PO TABS: 1.0000 | ORAL_TABLET | Freq: Every day | ORAL | 1 refills | Status: DC

## 2016-10-12 NOTE — Progress Notes (Signed)
  We were able to fill the first month of Brittany Summers's 3 months of Harvoni at [facility]  Her insurance is now requiring she fill at specialty pharmacy. Will send to [facility] - 28 with 1 refill since she has already started her first month.

## 2016-10-21 ENCOUNTER — Ambulatory Visit (INDEPENDENT_AMBULATORY_CARE_PROVIDER_SITE_OTHER): Payer: BLUE CROSS/BLUE SHIELD | Admitting: Pharmacist

## 2016-10-21 DIAGNOSIS — Z23 Encounter for immunization: Secondary | ICD-10-CM

## 2016-10-21 DIAGNOSIS — B182 Chronic viral hepatitis C: Secondary | ICD-10-CM | POA: Diagnosis not present

## 2016-10-21 NOTE — Progress Notes (Addendum)
HPI: Brittany Summers is a 61 y.o. female presents to the [place] clinic today for HepC follow-up. She has genotype 1a, F1/F2, and started 12 weeks of Harvoni on 10/04/16.   Lab Results  Component Value Date   HCVGENOTYPE 1a 09/22/2016   HEPCGENOTYPE 1a 06/21/2016    Allergies: Allergies  Allergen Reactions  . Penicillins     Past Medical History: Past Medical History:  Diagnosis Date  . Hypertension     Social History: Social History   Social History  . Marital status: Married    Spouse name: N/A  . Number of children: N/A  . Years of education: N/A   Social History Main Topics  . Smoking status: Former Smoker    Types: Cigarettes    Quit date: 07/15/2015  . Smokeless tobacco: Never Used  . Alcohol use No     Comment: as of 04-28-16 no alcohol   . Drug use: No  . Sexual activity: Not Currently    Partners: Male   Other Topics Concern  . Not on file   Social History Narrative  . No narrative on file    Labs: Hep B S Ab (no units)  Date Value  09/22/2016 NON-REACTIVE   Hepatitis B Surface Ag (no units)  Date Value  09/22/2016 NON-REACTIVE    Lab Results  Component Value Date   HCVGENOTYPE 1a 09/22/2016   HEPCGENOTYPE 1a 06/21/2016    No flowsheet data found.  AST  Date Value  09/22/2016 28 U/L  06/21/2016 34 IU/L  03/18/2016 32 IU/L   ALT  Date Value  09/22/2016 38 U/L (H)  09/22/2016 37 U/L (H)  06/21/2016 41 IU/L (H)  03/18/2016 43 IU/L (H)   INR (no units)  Date Value  09/22/2016 1.0    CrCl: CrCl cannot be calculated (Patient's most recent lab result is older than the maximum 21 days allowed.).  Fibrosis Score: F1/F2 as assessed by ARFI    Assessment: Brittany Summers is here today for her first follow up after starting [place] for hepC on 10/04/16. Initially, we planned for 8 weeks of treatment as her pre-treatment VL was ~5.8 million. On repeat testing, her VL increased to ~7 million and therefore we will treat for 12 weeks.  Brittany Summers was informed of this and had no objections.   Brittany Summers has been taking Harovni for ~3 weeks now. She reported some diarrhea initially, which has since improved. Advised that the GI side effects of Harvoni should subside after the first month. She also reports occasional HA, which she describes as very mild and not requiring self-treatment. Brittany Summers denies NV or fatigue. She denies missing any doses of Harvoni and is not taking any medications for acid reflux. She received her first month of Harvoni from [facility] but will be using [facility] for the remainder of her treatment. Brittany Summers has communicated with [facility] to receive her next month of Harvoni tomorrow before she goes out of town.   Brittany Summers received her flu shot and Pneumovax last month. She tested non-immune to hepA/B and was offered these vaccinations. She received the first of each series today and will return to clinic to complete them.   Recommendation -Start Hep A and B vaccination series today -HepC VL today -F/u appt on 9/5 @ 0930 for 2nd HepB shot -F/u appt on 12/17 @ 0930 for EOT visit   Erin N. Gerarda Fraction, PharmD PGY1 Pharmacy Resident Pager: [phone]  10/21/2016, 9:11 AM

## 2016-10-25 ENCOUNTER — Ambulatory Visit (HOSPITAL_COMMUNITY): Payer: BLUE CROSS/BLUE SHIELD

## 2016-10-25 LAB — HEPATITIS C RNA QUANTITATIVE
HCV QUANT LOG: NOT DETECTED {Log_IU}/mL
HCV RNA, PCR, QN: NOT DETECTED [IU]/mL

## 2016-11-09 ENCOUNTER — Ambulatory Visit (HOSPITAL_COMMUNITY)
Admission: RE | Admit: 2016-11-09 | Discharge: 2016-11-09 | Disposition: A | Discharge status: Home / Self Care | Payer: BLUE CROSS/BLUE SHIELD | Source: Ambulatory Visit | Attending: Internal Medicine | Admitting: Internal Medicine

## 2016-11-09 DIAGNOSIS — K8020 Calculus of gallbladder without cholecystitis without obstruction: Secondary | ICD-10-CM | POA: Diagnosis not present

## 2016-11-09 DIAGNOSIS — B182 Chronic viral hepatitis C: Secondary | ICD-10-CM | POA: Diagnosis present

## 2016-11-22 ENCOUNTER — Ambulatory Visit (INDEPENDENT_AMBULATORY_CARE_PROVIDER_SITE_OTHER): Payer: BLUE CROSS/BLUE SHIELD | Admitting: Pharmacist

## 2016-11-22 DIAGNOSIS — Z23 Encounter for immunization: Secondary | ICD-10-CM | POA: Diagnosis not present

## 2016-11-22 DIAGNOSIS — B182 Chronic viral hepatitis C: Secondary | ICD-10-CM

## 2016-11-22 NOTE — Progress Notes (Signed)
HPI: Brittany Summers is a 61 y.o. female who presents to the [place] clinic today for her second Hepatitis B immunization. She is also following with [place] for Hepatitis C.  She started 12 weeks of Harvoni on 10/04/16,  Lab Results  Component Value Date   HCVGENOTYPE 1a 09/22/2016   HEPCGENOTYPE 1a 06/21/2016    Allergies: Allergies  Allergen Reactions  . Penicillins     Vitals:    Past Medical History: Past Medical History:  Diagnosis Date  . Hypertension     Social History: Social History   Socioeconomic History  . Marital status: Married    Spouse name: Not on file  . Number of children: Not on file  . Years of education: Not on file  . Highest education level: Not on file  Social Needs  . Financial resource strain: Not on file  . Food insecurity - worry: Not on file  . Food insecurity - inability: Not on file  . Transportation needs - medical: Not on file  . Transportation needs - non-medical: Not on file  Occupational History  . Not on file  Tobacco Use  . Smoking status: Former Smoker    Types: Cigarettes    Last attempt to quit: 07/15/2015    Years since quitting: 1.3  . Smokeless tobacco: Never Used  Substance and Sexual Activity  . Alcohol use: No    Alcohol/week: 0.0 oz    Comment: as of 04-28-16 no alcohol   . Drug use: No  . Sexual activity: Not Currently    Partners: Male  Other Topics Concern  . Not on file  Social History Narrative  . Not on file    Labs: Hep B S Ab (no units)  Date Value  09/22/2016 NON-REACTIVE   Hepatitis B Surface Ag (no units)  Date Value  09/22/2016 NON-REACTIVE    Lab Results  Component Value Date   HCVGENOTYPE 1a 09/22/2016   HEPCGENOTYPE 1a 06/21/2016    Hepatitis C RNA quantitative Latest Ref Rng & Units 10/21/2016  HCV Quantitative Log NOT DETECT Log IU/mL <1.18 NOT DETECTED    AST  Date Value  09/22/2016 28 U/L  06/21/2016 34 IU/L  03/18/2016 32 IU/L   ALT  Date Value  09/22/2016 38 U/L  (H)  09/22/2016 37 U/L (H)  06/21/2016 41 IU/L (H)  03/18/2016 43 IU/L (H)   INR (no units)  Date Value  09/22/2016 1.0    CrCl: CrCl cannot be calculated (Patient's most recent lab result is older than the maximum 21 days allowed.).  Fibrosis Score: F2/F3  as assessed by ultrasound w/ elastography   Child-Pugh Score: A  Assessment: Brittany Summers presents to the clinic today for her second hepatitis B shot. She is doing well on her Harvoni and just picked up her last bottle. She reports no side effects and no missed doses. She did report some pain after her last visit with when she received both Hep A and Hep B vaccinations. We recommended she take some ibuprofen to help with the pain after her immunization today. We will follow-up with her on 12/17 for her EOT visit for Harvoni.   Recommendations: - 2nd Hep B today - Continue taking Harvoni every day - F/U with pharmacy on 12/17 at 9:30 AM for EOT visit - Will need 2nd Hep A and 3rd Hep B in 5 months   Susa Raring, Pharm.[facility] for Infectious Disease 11/22/2016, 9:52 AM

## 2017-01-03 ENCOUNTER — Ambulatory Visit (INDEPENDENT_AMBULATORY_CARE_PROVIDER_SITE_OTHER): Payer: BLUE CROSS/BLUE SHIELD | Admitting: Pharmacist

## 2017-01-03 DIAGNOSIS — B182 Chronic viral hepatitis C: Secondary | ICD-10-CM | POA: Diagnosis not present

## 2017-01-03 NOTE — Progress Notes (Signed)
[facility] Pharmacy Visit  HPI: Brittany Summers is a 61 y.o. female who presents to the [facility] for Hep C follow-up. She has genotype 1a, F2/F3, and completed 12 weeks of Harvoni last week around 12/12.  Patient Active Problem List   Diagnosis Date Noted  . Chronic hepatitis C without hepatic coma ([place]) 09/22/2016  . Hypertension 06/21/2016      Medication List        Accurate as of 01/03/17  9:46 AM. Always use your most recent med list.          cholecalciferol 1000 units tablet Commonly known as:  VITAMIN D   hydrochlorothiazide 25 MG tablet Commonly known as:  HYDRODIURIL Take 1 tablet (25 mg total) by mouth daily.   Potassium 99 MG Tabs       Allergies: Allergies  Allergen Reactions  . Penicillins     Past Medical History: Past Medical History:  Diagnosis Date  . Hypertension     Social History: Social History   Socioeconomic History  . Marital status: Married    Spouse name: Not on file  . Number of children: Not on file  . Years of education: Not on file  . Highest education level: Not on file  Social Needs  . Financial resource strain: Not on file  . Food insecurity - worry: Not on file  . Food insecurity - inability: Not on file  . Transportation needs - medical: Not on file  . Transportation needs - non-medical: Not on file  Occupational History  . Not on file  Tobacco Use  . Smoking status: Former Smoker    Types: Cigarettes    Last attempt to quit: 07/15/2015    Years since quitting: 1.4  . Smokeless tobacco: Never Used  Substance and Sexual Activity  . Alcohol use: No    Alcohol/week: 0.0 oz    Comment: as of 04-28-16 no alcohol   . Drug use: No  . Sexual activity: Not Currently    Partners: Male  Other Topics Concern  . Not on file  Social History Narrative  . Not on file    Labs: Hep B S Ab (no units)  Date Value  09/22/2016 NON-REACTIVE   Hepatitis B Surface Ag (no units)  Date  Value  09/22/2016 NON-REACTIVE    Lab Results  Component Value Date   HCVGENOTYPE 1a 09/22/2016   HEPCGENOTYPE 1a 06/21/2016    Hepatitis C RNA quantitative Latest Ref Rng & Units 10/21/2016  HCV Quantitative Log NOT DETECT Log IU/mL <1.18 NOT DETECTED    AST  Date Value  09/22/2016 28 U/L  06/21/2016 34 IU/L  03/18/2016 32 IU/L   ALT  Date Value  09/22/2016 38 U/L (H)  09/22/2016 37 U/L (H)  06/21/2016 41 IU/L (H)  03/18/2016 43 IU/L (H)   INR (no units)  Date Value  09/22/2016 1.0    Fibrosis Score: F2/F3 as assessed by [facility]   Assessment: Brittany Summers is here today to follow-up with me for her Hep C infection.  She has completed 12 weeks of Harvoni without issues.  No missed doses and no side effects except for a few headaches at the beginning of therapy.  Her early on treatment viral load was already undetectable.  Will get another viral load today and bring her back in 3-4 months for [facility] visit.    Plan: - Completed 12 weeks of Harvoni - HCV viral load today - F/u with me again for  cure visit 04/04/17 at 930am  Cassie L. Kuppelweiser, PharmD, [facility], [facility] 01/03/2017, 9:46 AM

## 2017-01-05 LAB — HEPATITIS C RNA QUANTITATIVE
HCV Quantitative Log: <1.18 Log IU/mL
HCV RNA, PCR, QN: <15 IU/mL

## 2017-03-24 ENCOUNTER — Encounter: Payer: Self-pay | Admitting: Family Medicine

## 2017-03-24 ENCOUNTER — Other Ambulatory Visit: Payer: Self-pay

## 2017-03-24 ENCOUNTER — Ambulatory Visit (INDEPENDENT_AMBULATORY_CARE_PROVIDER_SITE_OTHER): Payer: BLUE CROSS/BLUE SHIELD | Admitting: Family Medicine

## 2017-03-24 VITALS — BP 138/83 | HR 71 | Temp 97.9°F | Resp 18 | Ht 61.0 in | Wt 153.6 lb

## 2017-03-24 DIAGNOSIS — E2839 Other primary ovarian failure: Secondary | ICD-10-CM

## 2017-03-24 DIAGNOSIS — Z130 Encounter for screening for diseases of the blood and blood-forming organs and certain disorders involving the immune mechanism: Secondary | ICD-10-CM

## 2017-03-24 DIAGNOSIS — Z0000 Encounter for general adult medical examination without abnormal findings: Secondary | ICD-10-CM | POA: Diagnosis not present

## 2017-03-24 DIAGNOSIS — Z1231 Encounter for screening mammogram for malignant neoplasm of breast: Secondary | ICD-10-CM | POA: Diagnosis not present

## 2017-03-24 DIAGNOSIS — Z1389 Encounter for screening for other disorder: Secondary | ICD-10-CM

## 2017-03-24 DIAGNOSIS — I10 Essential (primary) hypertension: Secondary | ICD-10-CM | POA: Diagnosis not present

## 2017-03-24 DIAGNOSIS — E663 Overweight: Secondary | ICD-10-CM | POA: Diagnosis not present

## 2017-03-24 DIAGNOSIS — Z136 Encounter for screening for cardiovascular disorders: Secondary | ICD-10-CM | POA: Diagnosis not present

## 2017-03-24 DIAGNOSIS — Z1383 Encounter for screening for respiratory disorder NEC: Secondary | ICD-10-CM

## 2017-03-24 DIAGNOSIS — R739 Hyperglycemia, unspecified: Secondary | ICD-10-CM | POA: Diagnosis not present

## 2017-03-24 LAB — POCT URINALYSIS DIP (MANUAL ENTRY)
BILIRUBIN UA: NEGATIVE mg/dL
Bilirubin, UA: NEGATIVE
Glucose, UA: NEGATIVE mg/dL
LEUKOCYTES UA: NEGATIVE
Nitrite, UA: NEGATIVE
PH UA: 6 (ref 5.0–8.0)
PROTEIN UA: NEGATIVE mg/dL
RBC UA: NEGATIVE
SPEC GRAV UA: 1.015 (ref 1.010–1.025)
Urobilinogen, UA: 0.2 E.U./dL

## 2017-03-24 MED ORDER — LOSARTAN POTASSIUM-HCTZ 50-12.5 MG PO TABS: 1.0000 | ORAL_TABLET | Freq: Every day | ORAL | 0 refills | Status: DC

## 2017-03-24 NOTE — Patient Instructions (Addendum)
When you get your mammogram in June, also schedule your DEXA bone scan to be done about the same time. Please call [facility] at [phone] to schedule your mammogram and your DEXA bone scan at your convenience.     IF you received an x-ray today, you will receive an invoice from [facility]. Please contact [facility] at [phone] with questions or concerns regarding your invoice.   IF you received labwork today, you will receive an invoice from [facility]. Please contact [facility] at [phone] with questions or concerns regarding your invoice.   Our billing staff will not be able to assist you with questions regarding bills from these companies.  You will be contacted with the lab results as soon as they are available. The fastest way to get your results is to activate your My Chart account. Instructions are located on the last page of this paperwork. If you have not heard from [place] regarding the results in 2 weeks, please contact this office.     Bone Health Bones protect organs, store calcium, and anchor muscles. Good health habits, such as eating nutritious foods and exercising regularly, are important for maintaining healthy bones. They can also help to prevent a condition that causes bones to lose density and become weak and brittle (osteoporosis). Why is bone mass important? Bone mass refers to the amount of bone tissue that you have. The higher your bone mass, the stronger your bones. An important step toward having healthy bones throughout life is to have strong and dense bones during childhood. A young adult who has a high bone mass is more likely to have a high bone mass later in life. Bone mass at its greatest it is called peak bone mass. A large decline in bone mass occurs in older adults. In women, it occurs about the time of menopause. During this time, it is important to practice good health habits, because if more bone is lost  than what is replaced, the bones will become less healthy and more likely to break (fracture). If you find that you have a low bone mass, you may be able to prevent osteoporosis or further bone loss by changing your diet and lifestyle. How can I find out if my bone mass is low? Bone mass can be measured with an X-ray test that is called a bone mineral density (BMD) test. This test is recommended for all women who are age 44 or older. It may also be recommended for men who are age 9 or older, or for people who are more likely to develop osteoporosis due to:  Having bones that break easily.  Having a long-term disease that weakens bones, such as kidney disease or rheumatoid arthritis.  Having menopause earlier than normal.  Taking medicine that weakens bones, such as steroids, thyroid hormones, or hormone treatment for breast cancer or prostate cancer.  Smoking.  Drinking three or more alcoholic drinks each day.  What are the nutritional recommendations for healthy bones? To have healthy bones, you need to get enough of the right minerals and vitamins. Most nutrition experts recommend getting these nutrients from the foods that you eat. Nutritional recommendations vary from person to person. Ask your health care provider what is healthy for you. Here are some general guidelines. Calcium Recommendations Calcium is the most important (essential) mineral for bone health. Most people can get enough calcium from their diet, but supplements may be recommended for people who are at risk for osteoporosis. Good sources  of calcium include:  Dairy products, such as low-fat or nonfat milk, cheese, and yogurt.  Dark green leafy vegetables, such as bok choy and broccoli.  Calcium-fortified foods, such as orange juice, cereal, bread, soy beverages, and tofu products.  Nuts, such as almonds.  Follow these recommended amounts for daily calcium intake:  Children, age 34?3: 700 mg.  Children, age 79?8:  1,000 mg.  Children, age 84?13: 1,300 mg.  Teens, age 68?18: 1,300 mg.  Adults, age 82?50: 1,000 mg.  Adults, age 37?70: ? Men: 1,000 mg. ? Women: 1,200 mg.  Adults, age 57 or older: 1,200 mg.  Pregnant and breastfeeding females: ? Teens: 1,300 mg. ? Adults: 1,000 mg.  Vitamin D Recommendations Vitamin D is the most essential vitamin for bone health. It helps the body to absorb calcium. Sunlight stimulates the skin to make vitamin D, so be sure to get enough sunlight. If you live in a cold climate or you do not get outside often, your health care provider may recommend that you take vitamin D supplements. Good sources of vitamin D in your diet include:  Egg yolks.  Saltwater fish.  Milk and cereal fortified with vitamin D.  Follow these recommended amounts for daily vitamin D intake:  Children and teens, age 88?18: 43 international units.  Adults, age 347 or younger: 400-800 international units.  Adults, age 347 or older: 800-1,000 international units.  Other Nutrients Other nutrients for bone health include:  Phosphorus. This mineral is found in meat, poultry, dairy foods, nuts, and legumes. The recommended daily intake for adult men and adult women is 700 mg.  Magnesium. This mineral is found in seeds, nuts, dark green vegetables, and legumes. The recommended daily intake for adult men is 400?420 mg. For adult women, it is 310?320 mg.  Vitamin K. This vitamin is found in green leafy vegetables. The recommended daily intake is 120 mg for adult men and 90 mg for adult women.  What type of physical activity is best for building and maintaining healthy bones? Weight-bearing and strength-building activities are important for building and maintaining peak bone mass. Weight-bearing activities cause muscles and bones to work against gravity. Strength-building activities increases muscle strength that supports bones. Weight-bearing and muscle-building activities include:  Walking  and hiking.  Jogging and running.  Dancing.  Gym exercises.  Lifting weights.  Tennis and racquetball.  Climbing stairs.  Aerobics.  Adults should get at least 30 minutes of moderate physical activity on most days. Children should get at least 60 minutes of moderate physical activity on most days. Ask your health care provide what type of exercise is best for you. Where can I find more information? For more information, check out the following websites:  [place]: YardHomes.se  [employer] of Health: http://www.niams.AnonymousEar.fr.asp  This information is not intended to replace advice given to you by your health care provider. Make sure you discuss any questions you have with your health care provider. Document Released: 03/27/2003 Document Revised: 07/25/2015 Document Reviewed: 01/09/2014 Elsevier Interactive Patient Education  2018 [place] Maintenance for Postmenopausal Women Menopause is a normal process in which your reproductive ability comes to an end. This process happens gradually over a span of months to years, usually between the ages of 63 and 12. Menopause is complete when you have missed 12 consecutive menstrual periods. It is important to talk with your health care provider about some of the most common conditions that affect postmenopausal women, such as heart disease, cancer,  and bone loss (osteoporosis). Adopting a healthy lifestyle and getting preventive care can help to promote your health and wellness. Those actions can also lower your chances of developing some of these common conditions. What should I know about menopause? During menopause, you may experience a number of symptoms, such as:  Moderate-to-severe hot flashes.  Night sweats.  Decrease in sex drive.  Mood swings.  Headaches.  Tiredness.  Irritability.  Memory  problems.  Insomnia.  Choosing to treat or not to treat menopausal changes is an individual decision that you make with your health care provider. What should I know about hormone replacement therapy and supplements? Hormone therapy products are effective for treating symptoms that are associated with menopause, such as hot flashes and night sweats. Hormone replacement carries certain risks, especially as you become older. If you are thinking about using estrogen or estrogen with progestin treatments, discuss the benefits and risks with your health care provider. What should I know about heart disease and stroke? Heart disease, heart attack, and stroke become more likely as you age. This may be due, in part, to the hormonal changes that your body experiences during menopause. These can affect how your body processes dietary fats, triglycerides, and cholesterol. Heart attack and stroke are both medical emergencies. There are many things that you can do to help prevent heart disease and stroke:  Have your blood pressure checked at least every 1-2 years. High blood pressure causes heart disease and increases the risk of stroke.  If you are 55-79 years old, ask your health care provider if you should take aspirin to prevent a heart attack or a stroke.  Do not use any tobacco products, including cigarettes, chewing tobacco, or electronic cigarettes. If you need help quitting, ask your health care provider.  It is important to eat a healthy diet and maintain a healthy weight. ? Be sure to include plenty of vegetables, fruits, low-fat dairy products, and lean protein. ? Avoid eating foods that are high in solid fats, added sugars, or salt (sodium).  Get regular exercise. This is one of the most important things that you can do for your health. ? Try to exercise for at least 150 minutes each week. The type of exercise that you do should increase your heart rate and make you sweat. This is known as  moderate-intensity exercise. ? Try to do strengthening exercises at least twice each week. Do these in addition to the moderate-intensity exercise.  Know your numbers.Ask your health care provider to check your cholesterol and your blood glucose. Continue to have your blood tested as directed by your health care provider.  What should I know about cancer screening? There are several types of cancer. Take the following steps to reduce your risk and to catch any cancer development as early as possible. Breast Cancer  Practice breast self-awareness. ? This means understanding how your breasts normally appear and feel. ? It also means doing regular breast self-exams. Let your health care provider know about any changes, no matter how small.  If you are 40 or older, have a clinician do a breast exam (clinical breast exam or CBE) every year. Depending on your age, family history, and medical history, it may be recommended that you also have a yearly breast X-ray (mammogram).  If you have a family history of breast cancer, talk with your health care provider about genetic screening.  If you are at high risk for breast cancer, talk with your health care provider about   having an MRI and a mammogram every year.  Breast cancer (BRCA) gene test is recommended for women who have family members with BRCA-related cancers. Results of the assessment will determine the need for genetic counseling and BRCA1 and for BRCA2 testing. BRCA-related cancers include these types: ? Breast. This occurs in males or females. ? Ovarian. ? Tubal. This may also be called fallopian tube cancer. ? Cancer of the abdominal or pelvic lining (peritoneal cancer). ? Prostate. ? Pancreatic.  Cervical, Uterine, and Ovarian Cancer Your health care provider may recommend that you be screened regularly for cancer of the pelvic organs. These include your ovaries, uterus, and vagina. This screening involves a pelvic exam, which  includes checking for microscopic changes to the surface of your cervix (Pap test).  For women ages 21-65, health care providers may recommend a pelvic exam and a Pap test every three years. For women ages 10-65, they may recommend the Pap test and pelvic exam, combined with testing for human papilloma virus (HPV), every five years. Some types of HPV increase your risk of cervical cancer. Testing for HPV may also be done on women of any age who have unclear Pap test results.  Other health care providers may not recommend any screening for nonpregnant women who are considered low risk for pelvic cancer and have no symptoms. Ask your health care provider if a screening pelvic exam is right for you.  If you have had past treatment for cervical cancer or a condition that could lead to cancer, you need Pap tests and screening for cancer for at least 20 years after your treatment. If Pap tests have been discontinued for you, your risk factors (such as having a new sexual partner) need to be reassessed to determine if you should start having screenings again. Some women have medical problems that increase the chance of getting cervical cancer. In these cases, your health care provider may recommend that you have screening and Pap tests more often.  If you have a family history of uterine cancer or ovarian cancer, talk with your health care provider about genetic screening.  If you have vaginal bleeding after reaching menopause, tell your health care provider.  There are currently no reliable tests available to screen for ovarian cancer.  Lung Cancer Lung cancer screening is recommended for adults 46-27 years old who are at high risk for lung cancer because of a history of smoking. A yearly low-dose CT scan of the lungs is recommended if you:  Currently smoke.  Have a history of at least 30 pack-years of smoking and you currently smoke or have quit within the past 15 years. A pack-year is smoking an  average of one pack of cigarettes per day for one year.  Yearly screening should:  Continue until it has been 15 years since you quit.  Stop if you develop a health problem that would prevent you from having lung cancer treatment.  Colorectal Cancer  This type of cancer can be detected and can often be prevented.  Routine colorectal cancer screening usually begins at age 54 and continues through age 10.  If you have risk factors for colon cancer, your health care provider may recommend that you be screened at an earlier age.  If you have a family history of colorectal cancer, talk with your health care provider about genetic screening.  Your health care provider may also recommend using home test kits to check for hidden blood in your stool.  A small camera at  the end of a tube can be used to examine your colon directly (sigmoidoscopy or colonoscopy). This is done to check for the earliest forms of colorectal cancer.  Direct examination of the colon should be repeated every 5-10 years until age 72. However, if early forms of precancerous polyps or small growths are found or if you have a family history or genetic risk for colorectal cancer, you may need to be screened more often.  Skin Cancer  Check your skin from head to toe regularly.  Monitor any moles. Be sure to tell your health care provider: ? About any new moles or changes in moles, especially if there is a change in a mole's shape or color. ? If you have a mole that is larger than the size of a pencil eraser.  If any of your family members has a history of skin cancer, especially at a young age, talk with your health care provider about genetic screening.  Always use sunscreen. Apply sunscreen liberally and repeatedly throughout the day.  Whenever you are outside, protect yourself by wearing long sleeves, pants, a wide-brimmed hat, and sunglasses.  What should I know about osteoporosis? Osteoporosis is a condition in  which bone destruction happens more quickly than new bone creation. After menopause, you may be at an increased risk for osteoporosis. To help prevent osteoporosis or the bone fractures that can happen because of osteoporosis, the following is recommended:  If you are 33-75 years old, get at least 1,000 mg of calcium and at least 600 mg of vitamin D per day.  If you are older than age 16 but younger than age 2, get at least 1,200 mg of calcium and at least 600 mg of vitamin D per day.  If you are older than age 54, get at least 1,200 mg of calcium and at least 800 mg of vitamin D per day.  Smoking and excessive alcohol intake increase the risk of osteoporosis. Eat foods that are rich in calcium and vitamin D, and do weight-bearing exercises several times each week as directed by your health care provider. What should I know about how menopause affects my mental health? Depression may occur at any age, but it is more common as you become older. Common symptoms of depression include:  Low or sad mood.  Changes in sleep patterns.  Changes in appetite or eating patterns.  Feeling an overall lack of motivation or enjoyment of activities that you previously enjoyed.  Frequent crying spells.  Talk with your health care provider if you think that you are experiencing depression. What should I know about immunizations? It is important that you get and maintain your immunizations. These include:  Tetanus, diphtheria, and pertussis (Tdap) booster vaccine.  Influenza every year before the flu season begins.  Pneumonia vaccine.  Shingles vaccine.  Your health care provider may also recommend other immunizations. This information is not intended to replace advice given to you by your health care provider. Make sure you discuss any questions you have with your health care provider. Document Released: 02/26/2005 Document Revised: 07/25/2015 Document Reviewed: 10/08/2014 Elsevier Interactive  Patient Education  2018 [employer].

## 2017-03-24 NOTE — Progress Notes (Signed)
Subjective:  By signing my name below, I, Brittany Summers, attest that this documentation has been prepared under the direction and in the presence of [name], MD Electronically Signed: Ladene Artist, ED Scribe 03/24/2017 at 8:42 AM.   Patient ID: Brittany Summers, female    DOB: [birth year], 62 y.o.   MRN: [id number]  Chief Complaint  Patient presents with  . Annual Exam    No pap needed   HPI Brittany Summers is a 62 y.o. female who presents to Primary Care at [facility] for an annual exam. Pt is fasting at this visit.  Primary Preventative Screenings: Cervical Cancer: normal pap without HPV 07/08/15, rpt 5 yrs STI screening: completed 12 wks of harboni for chronic Hep C 12/2016, undetectable viral load, not immune to Hep A or B so immunization series started and needs final of each in 1 month. Appointment with Hep C clinic next wk. Neg HIV 2018. Positive trichomoniasis 06/2015, was not seen on in-house wet prep Breast Cancer: mammogram 06/2016 at [facility], normal Colorectal Cancer: colonoscopy [facility] 05/07/16, recall 3 yrs Tobacco use/EtOH/substances: screening lung CT 04/2016, rpt next month. Stopped smoking ~07/08/15. Bone Density: Cardiac: baseline EKG 06/2016, normal Weight/Blood sugar/Diet/Exercise: Pt has tried to cut back on salt intake but reports she has been a little more sedentary over the winter. BMI Readings from Last 3 Encounters:  03/24/17 29.02 kg/m  09/22/16 27.40 kg/m  06/21/16 27.44 kg/m   No results found for: HGBA1C OTC/Vit/Supp/Herbal: taking Vit D, eating a lot of yogurt, cheese and drinking milk Dentist/Optho: Immunizations:  Immunization History  Administered Date(s) Administered  . Hepatitis A, Adult 10/21/2016  . Hepatitis B, adult 10/21/2016, 11/22/2016  . Influenza,inj,Quad PF,6+ Mos 03/18/2016, 10/11/2016  . Pneumococcal Polysaccharide-23 09/22/2016  . Tdap 03/18/2016   Occasional Abdominal Pain Pt reports occasional abdominal pain that she  describes as a twinge. She attributes occasional pain to multiple gallstones that were shown on her abdominal US in 10/2016. Does report one episode of severe abdominal pain, nausea, emesis and fever 3-4 yrs ago that self-resolved within a few days.  Chronic Medical Conditions: HTN - started HCTZ 25 1 yr prior. Average systolic BPs outside of office in 130s.   Current Outpatient Medications on File Prior to Visit  Medication Sig Dispense Refill  . cholecalciferol (VITAMIN D) 1000 units tablet Take 1,000 Units by mouth daily.    . hydrochlorothiazide (HYDRODIURIL) 25 MG tablet Take 1 tablet (25 mg total) by mouth daily. 90 tablet 3  . Potassium 99 MG TABS Take by mouth daily.     No current facility-administered medications on file prior to visit.    Past Medical History:  Diagnosis Date  . Chronic hepatitis C without hepatic coma ([place]) 09/22/2016  . Hypertension    Past Surgical History:  Procedure Laterality Date  . BREAST BIOPSY Left 2017  . MAXILLARY SINUS LIFT    . MOUTH SURGERY    . TONSILLECTOMY     age 37    Allergies  Allergen Reactions  . Penicillins    Family History  Problem Relation Age of Onset  . Hypertension Mother   . Breast cancer Mother   . Hypertension Father   . Diabetes Father   . Cancer Father   . Leukemia Father   . Breast cancer Maternal Grandmother   . Stomach cancer Maternal Grandmother   . Colon cancer Neg Hx   . Colon polyps Neg Hx   . Rectal cancer Neg Hx   .  Esophageal cancer Neg Hx    Social History   Socioeconomic History  . Marital status: Married    Spouse name: Not on file  . Number of children: Not on file  . Years of education: Not on file  . Highest education level: Not on file  Occupational History  . Not on file  Social Needs  . Financial resource strain: Not on file  . Food insecurity:    Worry: Not on file    Inability: Not on file  . Transportation needs:    Medical: Not on file    Non-medical: Not on file    Tobacco Use  . Smoking status: Former Smoker    Types: Cigarettes    Last attempt to quit: 07/15/2015    Years since quitting: 1.7  . Smokeless tobacco: Never Used  Substance and Sexual Activity  . Alcohol use: No    Alcohol/week: 0.0 oz    Comment: as of 04-28-16 no alcohol   . Drug use: No  . Sexual activity: Not Currently    Partners: Male  Lifestyle  . Physical activity:    Days per week: Not on file    Minutes per session: Not on file  . Stress: Not on file  Relationships  . Social connections:    Talks on phone: Not on file    Gets together: Not on file    Attends religious service: Not on file    Active member of club or organization: Not on file    Attends meetings of clubs or organizations: Not on file    Relationship status: Not on file  Other Topics Concern  . Not on file  Social History Narrative  . Not on file   Depression screen [facility] 2/9 03/24/2017 09/22/2016 06/21/2016 03/18/2016 06/27/2015  Decreased Interest 0 0 0 1 0  Down, Depressed, Hopeless 0 0 0 2 0  PHQ - 2 Score 0 0 0 3 0  Altered sleeping - - - 1 -  Tired, decreased energy - - - 1 -  Change in appetite - - - 3 -  Feeling bad or failure about yourself  - - - 1 -  Trouble concentrating - - - 0 -  Moving slowly or fidgety/restless - - - 1 -  Suicidal thoughts - - - 0 -  PHQ-9 Score - - - 10 -  Difficult doing work/chores - - - Somewhat difficult -      Review of Systems  Gastrointestinal: Positive for abdominal pain (occasional).  All other systems reviewed and are negative.     Objective:   Physical Exam  Constitutional: She is oriented to person, place, and time. She appears well-developed and well-nourished. No distress.  HENT:  Head: Normocephalic and atraumatic.  Right Ear: Tympanic membrane and ear canal normal.  Left Ear: Tympanic membrane and ear canal normal.  Mouth/Throat: Oropharynx is clear and moist.  L ear: 3 mm blue blanchable venous hemangioma on L pinna Nose: congestion and dry  mucous membranes  Eyes: Conjunctivae and EOM are normal.  Neck: Neck supple. No tracheal deviation present. No thyroid mass and no thyromegaly present.  Cardiovascular: Normal rate, regular rhythm and normal heart sounds.  Pulmonary/Chest: Effort normal and breath sounds normal. No respiratory distress.  Normal breast exam.  Abdominal: Soft. Bowel sounds are normal. She exhibits no mass. There is no hepatosplenomegaly.  Musculoskeletal: Normal range of motion. She exhibits no edema.  Lymphadenopathy:    She has no axillary adenopathy.  Right: No supraclavicular adenopathy present.       Left: No supraclavicular adenopathy present.  Neurological: She is alert and oriented to person, place, and time.  Skin: Skin is warm and dry.  Psychiatric: She has a normal mood and affect. Her behavior is normal.  Nursing note and vitals reviewed.    Visual Acuity Screening   Right eye Left eye Both eyes  Without correction:     With correction: 20/20 20/20 20/20    BP 138/83 (BP Location: Right Arm, Patient Position: Sitting, Cuff Size: Large)   Pulse 71   Temp 97.9 F (36.6 C) (Oral)   Resp 18   Ht 5\' 1"  (1.549 m)   Wt 153 lb 9.6 oz (69.7 kg)   LMP  (LMP Unknown)   SpO2 99%   BMI 29.02 kg/m    Assessment & Plan:   1. Annual physical exam   2. Screening for cardiovascular, respiratory, and genitourinary diseases   3. Encounter for screening mammogram for breast cancer   4. Screening for deficiency anemia   5. Essential hypertension   6. Overweight (BMI 25.0-29.9)   7. Estrogen deficiency   8. Elevated blood sugar     Orders Placed This Encounter  Procedures  . DG Bone Density    Standing Status:   Future    Standing Expiration Date:   09/25/2018    Order Specific Question:   Reason for Exam (SYMPTOM  OR DIAGNOSIS REQUIRED)    Answer:   estrogen deficiency, caucasion, petite, h/o long-standing tobacco use    Order Specific Question:   Preferred imaging location?    Answer:    [facility]  . MM Digital Screening    Standing Status:   Future    Standing Expiration Date:   09/25/2018    Order Specific Question:   Reason for Exam (SYMPTOM  OR DIAGNOSIS REQUIRED)    Answer:   screening    Order Specific Question:   Preferred imaging location?    Answer:   [facility]  . CBC with Differential/Platelet  . Comprehensive metabolic panel    Order Specific Question:   Has the patient fasted?    Answer:   Yes  . Lipid panel    Order Specific Question:   Has the patient fasted?    Answer:   Yes  . Hemoglobin A1c  . POCT urinalysis dipstick    Meds ordered this encounter  Medications  . losartan-hydrochlorothiazide (HYZAAR) 50-12.5 MG tablet    Sig: Take 1 tablet by mouth daily.    Dispense:  90 tablet    Refill:  0    I personally performed the services described in this documentation, which was scribed in my presence. The recorded information has been reviewed and considered, and addended by me as needed.   [name], M.D.  Primary Care at [facility] [address] [place], [place] [address] [phone] phone [phone] fax  04/25/17 9:58 AM  Results for orders placed or performed in visit on 03/24/17  CBC with Differential/Platelet  Result Value Ref Range   WBC 7.4 3.4 - 10.8 x10E3/uL   RBC 4.37 3.77 - 5.28 x10E6/uL   Hemoglobin 13.4 11.1 - 15.9 g/dL   Hematocrit 39.4 34.0 - 46.6 %   MCV 90 79 - 97 fL   MCH 30.7 26.6 - 33.0 pg   MCHC 34.0 31.5 - 35.7 g/dL   RDW 12.7 12.3 - 15.4 %   Platelets 306 150 - 379  x10E3/uL   Neutrophils 58 Not Estab. %   Lymphs 31 Not Estab. %   Monocytes 8 Not Estab. %   Eos 2 Not Estab. %   Basos 1 Not Estab. %   Neutrophils Absolute 4.3 1.4 - 7.0 x10E3/uL   Lymphocytes Absolute 2.3 0.7 - 3.1 x10E3/uL   Monocytes Absolute 0.6 0.1 - 0.9 x10E3/uL   EOS (ABSOLUTE) 0.1 0.0 - 0.4 x10E3/uL   Basophils Absolute 0.0 0.0 - 0.2 x10E3/uL   Immature Granulocytes 0 Not Estab. %   Immature Grans (Abs) 0.0  0.0 - 0.1 x10E3/uL  Comprehensive metabolic panel  Result Value Ref Range   Glucose 99 65 - 99 mg/dL   BUN 6 (L) 8 - 27 mg/dL   Creatinine, Ser 0.57 0.57 - 1.00 mg/dL   GFR calc non Af Amer 99 >59 mL/min/1.73   GFR calc Af Amer 114 >59 mL/min/1.73   BUN/Creatinine Ratio 11 (L) 12 - 28   Sodium 137 134 - 144 mmol/L   Potassium 4.8 3.5 - 5.2 mmol/L   Chloride 91 (L) 96 - 106 mmol/L   CO2 23 20 - 29 mmol/L   Calcium 9.5 8.7 - 10.3 mg/dL   Total Protein 8.0 6.0 - 8.5 g/dL   Albumin 4.7 3.6 - 4.8 g/dL   Globulin, Total 3.3 1.5 - 4.5 g/dL   Albumin/Globulin Ratio 1.4 1.2 - 2.2   Bilirubin Total 0.3 0.0 - 1.2 mg/dL   Alkaline Phosphatase 60 39 - 117 IU/L   AST 18 0 - 40 IU/L   ALT 17 0 - 32 IU/L  Lipid panel  Result Value Ref Range   Cholesterol, Total 190 100 - 199 mg/dL   Triglycerides 58 0 - 149 mg/dL   HDL 94 >39 mg/dL   VLDL Cholesterol Cal 12 5 - 40 mg/dL   LDL Calculated 84 0 - 99 mg/dL   Chol/HDL Ratio 2.0 0.0 - 4.4 ratio  POCT urinalysis dipstick  Result Value Ref Range   Color, UA yellow yellow   Clarity, UA clear clear   Glucose, UA negative negative mg/dL   Bilirubin, UA negative negative   Ketones, POC UA negative negative mg/dL   Spec Grav, UA 1.015 1.010 - 1.025   Blood, UA negative negative   pH, UA 6.0 5.0 - 8.0   Protein Ur, POC negative negative mg/dL   Urobilinogen, UA 0.2 0.2 or 1.0 E.U./dL   Nitrite, UA Negative Negative   Leukocytes, UA Negative Negative

## 2017-04-04 ENCOUNTER — Ambulatory Visit (INDEPENDENT_AMBULATORY_CARE_PROVIDER_SITE_OTHER): Payer: BLUE CROSS/BLUE SHIELD | Admitting: Pharmacist

## 2017-04-04 DIAGNOSIS — B182 Chronic viral hepatitis C: Secondary | ICD-10-CM | POA: Diagnosis not present

## 2017-04-04 NOTE — Progress Notes (Signed)
[place] for Infectious Disease Pharmacy Visit  HPI: Brittany Summers is a 62 y.o. female who presents to the [facility] for her Hep C cure visit.  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  Patient Active Problem List   Diagnosis Date Noted  . Hypertension 06/21/2016    Patient's Medications  New Prescriptions   No medications on file  Previous Medications   CHOLECALCIFEROL (VITAMIN D) 1000 UNITS TABLET    Take 1,000 Units by mouth daily.   LOSARTAN-HYDROCHLOROTHIAZIDE (HYZAAR) 50-12.5 MG TABLET    Take 1 tablet by mouth daily.  Modified Medications   No medications on file  Discontinued Medications   No medications on file    Allergies: Allergies  Allergen Reactions  . Penicillins     Past Medical History: Past Medical History:  Diagnosis Date  . Chronic hepatitis C without hepatic coma ([place]) 09/22/2016  . Hypertension     Social History: Social History   Socioeconomic History  . Marital status: Married    Spouse name: Not on file  . Number of children: Not on file  . Years of education: Not on file  . Highest education level: Not on file  Social Needs  . Financial resource strain: Not on file  . Food insecurity - worry: Not on file  . Food insecurity - inability: Not on file  . Transportation needs - medical: Not on file  . Transportation needs - non-medical: Not on file  Occupational History  . Not on file  Tobacco Use  . Smoking status: Former Smoker    Types: Cigarettes    Last attempt to quit: 07/15/2015    Years since quitting: 1.7  . Smokeless tobacco: Never Used  Substance and Sexual Activity  . Alcohol use: No    Alcohol/week: 0.0 oz    Comment: as of 04-28-16 no alcohol   . Drug use: No  . Sexual activity: Not Currently    Partners: Male  Other Topics Concern  . Not on file  Social History Narrative  . Not on file    Labs: Hep B S Ab (no units)  Date Value  09/22/2016 NON-REACTIVE    Hepatitis B Surface Ag (no units)  Date Value  09/22/2016 NON-REACTIVE    Lab Results  Component Value Date   HCVGENOTYPE 1a 09/22/2016   HEPCGENOTYPE 1a 06/21/2016    Hepatitis C RNA quantitative Latest Ref Rng & Units 01/03/2017 10/21/2016  HCV Quantitative Log NOT DETECT Log IU/mL <1.18 NOT DETECTED <1.18 NOT DETECTED    AST  Date Value  09/22/2016 28 U/L  06/21/2016 34 IU/L  03/18/2016 32 IU/L   ALT  Date Value  09/22/2016 38 U/L (H)  09/22/2016 37 U/L (H)  06/21/2016 41 IU/L (H)  03/18/2016 43 IU/L (H)   INR (no units)  Date Value  09/22/2016 1.0    Fibrosis Score: F2/F3 as assessed by elastography   Assessment: Brittany Summers is here today for her Hep C cure visit. She completed 3 months of Harvoni back in December.  Her Hep C viral load was undetectable when checked early on treatment and at the end of treatment.  Will check SVR12 today. I counseled on the risk of reinfection.  She is due for her 3rd Hep B and 2nd Hep A vaccine next month and states her PCP will give those to her at her appointment in April. I will call her with results.   Plan: - SVR12 Hep C  viral load today - Call with results  Cassie L. Kuppelweiser, PharmD, [place], [place] for Infectious Disease 04/04/2017, 11:01 AM

## 2017-04-05 LAB — CBC WITH DIFFERENTIAL/PLATELET
BASOS: 1 %
Basophils Absolute: 0 10*3/uL (ref 0.0–0.2)
EOS (ABSOLUTE): 0.1 10*3/uL (ref 0.0–0.4)
EOS: 2 %
HEMATOCRIT: 39.4 % (ref 34.0–46.6)
Hemoglobin: 13.4 g/dL (ref 11.1–15.9)
Immature Grans (Abs): 0 10*3/uL (ref 0.0–0.1)
Immature Granulocytes: 0 %
LYMPHS ABS: 2.3 10*3/uL (ref 0.7–3.1)
Lymphs: 31 %
MCH: 30.7 pg (ref 26.6–33.0)
MCHC: 34 g/dL (ref 31.5–35.7)
MCV: 90 fL (ref 79–97)
MONOS ABS: 0.6 10*3/uL (ref 0.1–0.9)
Monocytes: 8 %
NEUTROS ABS: 4.3 10*3/uL (ref 1.4–7.0)
Neutrophils: 58 %
PLATELETS: 306 10*3/uL (ref 150–379)
RBC: 4.37 x10E6/uL (ref 3.77–5.28)
RDW: 12.7 % (ref 12.3–15.4)
WBC: 7.4 10*3/uL (ref 3.4–10.8)

## 2017-04-05 LAB — COMPREHENSIVE METABOLIC PANEL
A/G RATIO: 1.4 (ref 1.2–2.2)
ALT: 17 IU/L (ref 0–32)
AST: 18 IU/L (ref 0–40)
Albumin: 4.7 g/dL (ref 3.6–4.8)
Alkaline Phosphatase: 60 IU/L (ref 39–117)
BILIRUBIN TOTAL: 0.3 mg/dL (ref 0.0–1.2)
BUN/Creatinine Ratio: 11 — ABNORMAL LOW (ref 12–28)
BUN: 6 mg/dL — ABNORMAL LOW (ref 8–27)
CALCIUM: 9.5 mg/dL (ref 8.7–10.3)
CO2: 23 mmol/L (ref 20–29)
Chloride: 91 mmol/L — ABNORMAL LOW (ref 96–106)
Creatinine, Ser: 0.57 mg/dL (ref 0.57–1.00)
GFR, EST AFRICAN AMERICAN: 114 mL/min/{1.73_m2} (ref >59)
GFR, EST NON AFRICAN AMERICAN: 99 mL/min/{1.73_m2} (ref >59)
Globulin, Total: 3.3 g/dL (ref 1.5–4.5)
Glucose: 99 mg/dL (ref 65–99)
POTASSIUM: 4.8 mmol/L (ref 3.5–5.2)
SODIUM: 137 mmol/L (ref 134–144)
TOTAL PROTEIN: 8 g/dL (ref 6.0–8.5)

## 2017-04-05 LAB — LIPID PANEL
CHOL/HDL RATIO: 2 ratio (ref 0.0–4.4)
Cholesterol, Total: 190 mg/dL (ref 100–199)
HDL: 94 mg/dL (ref >39)
LDL Calculated: 84 mg/dL (ref 0–99)
Triglycerides: 58 mg/dL (ref 0–149)
VLDL Cholesterol Cal: 12 mg/dL (ref 5–40)

## 2017-04-06 ENCOUNTER — Telehealth: Payer: Self-pay | Admitting: Pharmacist

## 2017-04-06 LAB — HEPATITIS C RNA QUANTITATIVE
HCV Quantitative Log: <1.18 Log IU/mL
HCV RNA, PCR, QN: NOT DETECTED [IU]/mL

## 2017-04-06 NOTE — Telephone Encounter (Signed)
Called Brittany Summers to let her know that her Hep C viral load SVR12 lab was undetectable so she is cured of her Hepatitis C infection.

## 2017-04-25 ENCOUNTER — Encounter: Payer: Self-pay | Admitting: Family Medicine

## 2017-04-25 ENCOUNTER — Ambulatory Visit: Payer: BLUE CROSS/BLUE SHIELD | Admitting: Family Medicine

## 2017-04-25 ENCOUNTER — Other Ambulatory Visit: Payer: Self-pay

## 2017-04-25 VITALS — BP 138/92 | HR 87 | Temp 98.0°F | Resp 17 | Ht 61.0 in | Wt 157.8 lb

## 2017-04-25 DIAGNOSIS — Z5181 Encounter for therapeutic drug level monitoring: Secondary | ICD-10-CM | POA: Diagnosis not present

## 2017-04-25 DIAGNOSIS — Z23 Encounter for immunization: Secondary | ICD-10-CM

## 2017-04-25 DIAGNOSIS — I10 Essential (primary) hypertension: Secondary | ICD-10-CM | POA: Diagnosis not present

## 2017-04-25 MED ORDER — LOSARTAN POTASSIUM-HCTZ 50-12.5 MG PO TABS: 2.0000 | ORAL_TABLET | Freq: Every day | ORAL | 1 refills | Status: DC

## 2017-04-25 NOTE — Patient Instructions (Addendum)
Increase your BP med to 2 tabs every day.  Recheck in 4-6 months    IF you received an x-ray today, you will receive an invoice from [facility]. Please contact [facility] at [phone] with questions or concerns regarding your invoice.   IF you received labwork today, you will receive an invoice from [facility]. Please contact [facility] at [phone] with questions or concerns regarding your invoice.   Our billing staff will not be able to assist you with questions regarding bills from these companies.  You will be contacted with the lab results as soon as they are available. The fastest way to get your results is to activate your My Chart account. Instructions are located on the last page of this paperwork. If you have not heard from [place] regarding the results in 2 weeks, please contact this office.     Managing Your Hypertension Hypertension is commonly called high blood pressure. This is when the force of your blood pressing against the walls of your arteries is too strong. Arteries are blood vessels that carry blood from your heart throughout your body. Hypertension forces the heart to work harder to pump blood, and may cause the arteries to become narrow or stiff. Having untreated or uncontrolled hypertension can cause heart attack, stroke, kidney disease, and other problems. What are blood pressure readings? A blood pressure reading consists of a higher number over a lower number. Ideally, your blood pressure should be below 120/80. The first ("top") number is called the systolic pressure. It is a measure of the pressure in your arteries as your heart beats. The second ("bottom") number is called the diastolic pressure. It is a measure of the pressure in your arteries as the heart relaxes. What does my blood pressure reading mean? Blood pressure is classified into four stages. Based on your blood pressure reading, your health care provider may use the following stages to  determine what type of treatment you need, if any. Systolic pressure and diastolic pressure are measured in a unit called mm Hg. Normal  Systolic pressure: below 244.  Diastolic pressure: below 80. Elevated  Systolic pressure: 010-272.  Diastolic pressure: below 80. Hypertension stage 1  Systolic pressure: 536-644.  Diastolic pressure: 03-47. Hypertension stage 2  Systolic pressure: 425 or above.  Diastolic pressure: 90 or above. What health risks are associated with hypertension? Managing your hypertension is an important responsibility. Uncontrolled hypertension can lead to:  A heart attack.  A stroke.  A weakened blood vessel (aneurysm).  Heart failure.  Kidney damage.  Eye damage.  Metabolic syndrome.  Memory and concentration problems.  What changes can I make to manage my hypertension? Hypertension can be managed by making lifestyle changes and possibly by taking medicines. Your health care provider will help you make a plan to bring your blood pressure within a normal range. Eating and drinking  Eat a diet that is high in fiber and potassium, and low in salt (sodium), added sugar, and fat. An example eating plan is called the DASH (Dietary Approaches to Stop Hypertension) diet. To eat this way: ? Eat plenty of fresh fruits and vegetables. Try to fill half of your plate at each meal with fruits and vegetables. ? Eat whole grains, such as whole wheat pasta, brown rice, or whole grain bread. Fill about one quarter of your plate with whole grains. ? Eat low-fat diary products. ? Avoid fatty cuts of meat, processed or cured meats, and poultry with skin. Fill about one quarter of  your plate with lean proteins such as fish, chicken without skin, beans, eggs, and tofu. ? Avoid premade and processed foods. These tend to be higher in sodium, added sugar, and fat.  Reduce your daily sodium intake. Most people with hypertension should eat less than 1,500 mg of sodium a  day.  Limit alcohol intake to no more than 1 drink a day for nonpregnant women and 2 drinks a day for men. One drink equals 12 oz of beer, 5 oz of wine, or 1 oz of hard liquor. Lifestyle  Work with your health care provider to maintain a healthy body weight, or to lose weight. Ask what an ideal weight is for you.  Get at least 30 minutes of exercise that causes your heart to beat faster (aerobic exercise) most days of the week. Activities may include walking, swimming, or biking.  Include exercise to strengthen your muscles (resistance exercise), such as weight lifting, as part of your weekly exercise routine. Try to do these types of exercises for 30 minutes at least 3 days a week.  Do not use any products that contain nicotine or tobacco, such as cigarettes and e-cigarettes. If you need help quitting, ask your health care provider.  Control any long-term (chronic) conditions you have, such as high cholesterol or diabetes. Monitoring  Monitor your blood pressure at home as told by your health care provider. Your personal target blood pressure may vary depending on your medical conditions, your age, and other factors.  Have your blood pressure checked regularly, as often as told by your health care provider. Working with your health care provider  Review all the medicines you take with your health care provider because there may be side effects or interactions.  Talk with your health care provider about your diet, exercise habits, and other lifestyle factors that may be contributing to hypertension.  Visit your health care provider regularly. Your health care provider can help you create and adjust your plan for managing hypertension. Will I need medicine to control my blood pressure? Your health care provider may prescribe medicine if lifestyle changes are not enough to get your blood pressure under control, and if:  Your systolic blood pressure is 130 or higher.  Your diastolic  blood pressure is 80 or higher.  Take medicines only as told by your health care provider. Follow the directions carefully. Blood pressure medicines must be taken as prescribed. The medicine does not work as well when you skip doses. Skipping doses also puts you at risk for problems. Contact a health care provider if:  You think you are having a reaction to medicines you have taken.  You have repeated (recurrent) headaches.  You feel dizzy.  You have swelling in your ankles.  You have trouble with your vision. Get help right away if:  You develop a severe headache or confusion.  You have unusual weakness or numbness, or you feel faint.  You have severe pain in your chest or abdomen.  You vomit repeatedly.  You have trouble breathing. Summary  Hypertension is when the force of blood pumping through your arteries is too strong. If this condition is not controlled, it may put you at risk for serious complications.  Your personal target blood pressure may vary depending on your medical conditions, your age, and other factors. For most people, a normal blood pressure is less than 120/80.  Hypertension is managed by lifestyle changes, medicines, or both. Lifestyle changes include weight loss, eating a healthy, low-sodium diet,  exercising more, and limiting alcohol. This information is not intended to replace advice given to you by your health care provider. Make sure you discuss any questions you have with your health care provider. Document Released: 09/29/2011 Document Revised: 12/03/2015 Document Reviewed: 12/03/2015 Elsevier Interactive Patient Education  [employer].

## 2017-04-25 NOTE — Progress Notes (Signed)
Subjective:  . 04/25/2017 , 10:00 AM .     Patient ID: Brittany Summers, female    DOB: February 11, 1955, 62 y.o.   MRN: [id number] Chief Complaint  Patient presents with  . Hypertension    follow up/ weight check and vaccines   HPI Brittany Summers is a 62 y.o. female who presents to Primary Care at [facility] to f/u on her new BP med that we started last mo. Patient was on HCTZ 25mg  with OTC potassium 99, but not ideally controlled so changed to Losartan-HCTZ 50-12.5mg  1 month prior.    PFT's done 06/18/2016.   Past Medical History:  Diagnosis Date  . 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 Hypertension    Past Surgical History:  Procedure Laterality Date  . BREAST BIOPSY Left 2017  . MAXILLARY SINUS LIFT    . MOUTH SURGERY    . TONSILLECTOMY     age 45   Prior to Admission medications   Medication Sig Start Date End Date Taking? Authorizing Provider  cholecalciferol (VITAMIN D) 1000 units tablet Take 1,000 Units by mouth daily.    [provider]  losartan-hydrochlorothiazide (HYZAAR) 50-12.5 MG tablet Take 1 tablet by mouth daily. 03/24/17   Shawnee Knapp, MD   Allergies  Allergen Reactions  . Penicillins    Family History  Problem Relation Age of Onset  . Hypertension Mother   . Breast cancer Mother   . Hypertension Father   . Diabetes Father   . Cancer Father   . Leukemia Father   . Breast cancer Maternal Grandmother   . Stomach cancer Maternal Grandmother   . Colon cancer Neg Hx   . Colon polyps Neg Hx   . Rectal cancer Neg Hx   . Esophageal cancer Neg Hx    Social History   Socioeconomic History  . Marital status: Married    Spouse name: Not on file  . Number of children: Not on file  . Years of education: Not on file  . Highest education level: Not on file  Occupational History  . Not on file  Social Needs  . Financial resource strain: Not on file  . Food insecurity:    Worry: Not on file    Inability: Not on file  .  Transportation needs:    Medical: Not on file    Non-medical: Not on file  Tobacco Use  . Smoking status: Former Smoker    Types: Cigarettes    Last attempt to quit: 07/15/2015    Years since quitting: 1.7  . Smokeless tobacco: Never Used  Substance and Sexual Activity  . Alcohol use: No    Alcohol/week: 0.0 oz    Comment: as of 04-28-16 no alcohol   . Drug use: No  . Sexual activity: Not Currently    Partners: Male  Lifestyle  . Physical activity:    Days per week: Not on file    Minutes per session: Not on file  . Stress: Not on file  Relationships  . Social connections:    Talks on phone: Not on file    Gets together: Not on file    Attends religious service: Not on file    Active member of club or organization: Not on file    Attends meetings of clubs or organizations: Not on file    Relationship status: Not on file  Other Topics Concern  . Not on file  Social History Narrative  . Not on file  Depression screen [facility] 2/9 03/24/2017 09/22/2016 06/21/2016 03/18/2016 06/27/2015  Decreased Interest 0 0 0 1 0  Down, Depressed, Hopeless 0 0 0 2 0  PHQ - 2 Score 0 0 0 3 0  Altered sleeping - - - 1 -  Tired, decreased energy - - - 1 -  Change in appetite - - - 3 -  Feeling bad or failure about yourself  - - - 1 -  Trouble concentrating - - - 0 -  Moving slowly or fidgety/restless - - - 1 -  Suicidal thoughts - - - 0 -  PHQ-9 Score - - - 10 -  Difficult doing work/chores - - - Somewhat difficult -    Review of Systems     Objective:   Physical Exam  Constitutional: She is oriented to person, place, and time. She appears well-developed and well-nourished. No distress.  HENT:  Head: Normocephalic and atraumatic.  Eyes: Pupils are equal, round, and reactive to light. EOM are normal.  Neck: Neck supple.  Cardiovascular: Normal rate.  Pulmonary/Chest: Effort normal. No respiratory distress.  Musculoskeletal: Normal range of motion.  Neurological: She is alert and oriented to  person, place, and time.  Skin: Skin is warm and dry.  Psychiatric: She has a normal mood and affect. Her behavior is normal.  Nursing note and vitals reviewed.   BP (!) 138/92 (BP Location: Right Arm, Cuff Size: Normal)   Pulse 87   Temp 98 F (36.7 C) (Oral)   Resp 17   Ht 5\' 1"  (1.549 m)   Wt 157 lb 12.8 oz (71.6 kg)   LMP  (LMP Unknown)   SpO2 100%   BMI 29.82 kg/m      Assessment & Plan:   1. Essential hypertension   2. Medication monitoring encounter   3. Need for prophylactic vaccination and inoculation against viral hepatitis     Orders Placed This Encounter  Procedures  . Hepatitis A vaccine adult IM  . Hepatitis B vaccine adult IM  . Basic metabolic panel    Order Specific Question:   Has the patient fasted?    Answer:   No    Meds ordered this encounter  Medications  . losartan-hydrochlorothiazide (HYZAAR) 50-12.5 MG tablet    Sig: Take 2 tablets by mouth daily.    Dispense:  180 tablet    Refill:  1    [name], M.D.  Primary Care at [facility] [address] [place], [place] [address] [phone] phone [phone] fax  04/28/17 11:53 PM

## 2017-04-26 LAB — BASIC METABOLIC PANEL
BUN / CREAT RATIO: 17 (ref 12–28)
BUN: 10 mg/dL (ref 8–27)
CO2: 19 mmol/L — ABNORMAL LOW (ref 20–29)
CREATININE: 0.6 mg/dL (ref 0.57–1.00)
Calcium: 9.5 mg/dL (ref 8.7–10.3)
Chloride: 94 mmol/L — ABNORMAL LOW (ref 96–106)
GFR calc Af Amer: 112 mL/min/{1.73_m2} (ref >59)
GFR, EST NON AFRICAN AMERICAN: 97 mL/min/{1.73_m2} (ref >59)
Glucose: 99 mg/dL (ref 65–99)
Potassium: 4.9 mmol/L (ref 3.5–5.2)
SODIUM: 134 mmol/L (ref 134–144)

## 2017-04-27 ENCOUNTER — Ambulatory Visit (INDEPENDENT_AMBULATORY_CARE_PROVIDER_SITE_OTHER)
Admission: RE | Admit: 2017-04-27 | Discharge: 2017-04-27 | Disposition: A | Discharge status: Home / Self Care | Payer: BLUE CROSS/BLUE SHIELD | Source: Ambulatory Visit | Attending: Acute Care | Admitting: Acute Care

## 2017-04-27 ENCOUNTER — Encounter: Payer: Self-pay | Admitting: Physician Assistant

## 2017-04-27 DIAGNOSIS — Z87891 Personal history of nicotine dependence: Secondary | ICD-10-CM

## 2017-05-02 ENCOUNTER — Other Ambulatory Visit: Payer: Self-pay | Admitting: Acute Care

## 2017-05-02 DIAGNOSIS — Z87891 Personal history of nicotine dependence: Secondary | ICD-10-CM

## 2017-05-02 DIAGNOSIS — Z122 Encounter for screening for malignant neoplasm of respiratory organs: Secondary | ICD-10-CM

## 2017-05-22 ENCOUNTER — Encounter: Payer: Self-pay | Admitting: Family Medicine

## 2017-05-23 MED ORDER — BENAZEPRIL-HYDROCHLOROTHIAZIDE 20-12.5 MG PO TABS: 2.0000 | ORAL_TABLET | Freq: Every day | ORAL | 1 refills | Status: DC

## 2017-06-06 ENCOUNTER — Emergency Department (HOSPITAL_COMMUNITY)
Admission: EM | Admit: 2017-06-06 | Discharge: 2017-06-07 | Disposition: A | Discharge status: Home / Self Care | Payer: BLUE CROSS/BLUE SHIELD | Attending: Emergency Medicine | Admitting: Emergency Medicine

## 2017-06-06 ENCOUNTER — Encounter (HOSPITAL_COMMUNITY): Payer: Self-pay | Admitting: Emergency Medicine

## 2017-06-06 ENCOUNTER — Emergency Department (HOSPITAL_COMMUNITY): Payer: BLUE CROSS/BLUE SHIELD

## 2017-06-06 DIAGNOSIS — Z79899 Other long term (current) drug therapy: Secondary | ICD-10-CM | POA: Insufficient documentation

## 2017-06-06 DIAGNOSIS — D259 Leiomyoma of uterus, unspecified: Secondary | ICD-10-CM | POA: Insufficient documentation

## 2017-06-06 DIAGNOSIS — E878 Other disorders of electrolyte and fluid balance, not elsewhere classified: Secondary | ICD-10-CM | POA: Diagnosis not present

## 2017-06-06 DIAGNOSIS — E876 Hypokalemia: Secondary | ICD-10-CM | POA: Diagnosis not present

## 2017-06-06 DIAGNOSIS — I10 Essential (primary) hypertension: Secondary | ICD-10-CM | POA: Diagnosis not present

## 2017-06-06 DIAGNOSIS — E871 Hypo-osmolality and hyponatremia: Secondary | ICD-10-CM

## 2017-06-06 DIAGNOSIS — R1032 Left lower quadrant pain: Secondary | ICD-10-CM | POA: Diagnosis not present

## 2017-06-06 DIAGNOSIS — Z87891 Personal history of nicotine dependence: Secondary | ICD-10-CM | POA: Insufficient documentation

## 2017-06-06 DIAGNOSIS — R112 Nausea with vomiting, unspecified: Secondary | ICD-10-CM | POA: Insufficient documentation

## 2017-06-06 LAB — COMPREHENSIVE METABOLIC PANEL
ALBUMIN: 4.6 g/dL (ref 3.5–5.0)
ALT: 18 U/L (ref 14–54)
AST: 20 U/L (ref 15–41)
Alkaline Phosphatase: 52 U/L (ref 38–126)
Anion gap: 16 — ABNORMAL HIGH (ref 5–15)
BILIRUBIN TOTAL: 0.8 mg/dL (ref 0.3–1.2)
BUN: 8 mg/dL (ref 6–20)
CO2: 24 mmol/L (ref 22–32)
CREATININE: 0.54 mg/dL (ref 0.44–1.00)
Calcium: 9.1 mg/dL (ref 8.9–10.3)
Chloride: 87 mmol/L — ABNORMAL LOW (ref 101–111)
GFR calc Af Amer: >60 mL/min (ref >60)
Glucose, Bld: 138 mg/dL — ABNORMAL HIGH (ref 65–99)
POTASSIUM: 2.8 mmol/L — AB (ref 3.5–5.1)
Sodium: 127 mmol/L — ABNORMAL LOW (ref 135–145)
TOTAL PROTEIN: 8.2 g/dL — AB (ref 6.5–8.1)

## 2017-06-06 LAB — CBC
HCT: 38.4 % (ref 36.0–46.0)
Hemoglobin: 13.2 g/dL (ref 12.0–15.0)
MCH: 30.6 pg (ref 26.0–34.0)
MCHC: 34.4 g/dL (ref 30.0–36.0)
MCV: 89.1 fL (ref 78.0–100.0)
PLATELETS: 314 10*3/uL (ref 150–400)
RBC: 4.31 MIL/uL (ref 3.87–5.11)
RDW: 12.3 % (ref 11.5–15.5)
WBC: 10.2 10*3/uL (ref 4.0–10.5)

## 2017-06-06 LAB — URINALYSIS, ROUTINE W REFLEX MICROSCOPIC
BILIRUBIN URINE: NEGATIVE
Bacteria, UA: NONE SEEN
GLUCOSE, UA: NEGATIVE mg/dL
HGB URINE DIPSTICK: NEGATIVE
Ketones, ur: 80 mg/dL — AB
LEUKOCYTES UA: NEGATIVE
NITRITE: NEGATIVE
PH: 6 (ref 5.0–8.0)
Protein, ur: 30 mg/dL — AB
SPECIFIC GRAVITY, URINE: 1.019 (ref 1.005–1.030)

## 2017-06-06 LAB — LIPASE, BLOOD: LIPASE: 24 U/L (ref 11–51)

## 2017-06-06 LAB — MAGNESIUM: Magnesium: 1.8 mg/dL (ref 1.7–2.4)

## 2017-06-06 MED ORDER — SODIUM CHLORIDE 0.9 % IV BOLUS
1000.0000 mL | Freq: Once | INTRAVENOUS | Status: AC
  Administered 2017-06-06: 1000 mL via INTRAVENOUS

## 2017-06-06 MED ORDER — POTASSIUM CHLORIDE 10 MEQ/100ML IV SOLN
10.0000 meq | INTRAVENOUS | Status: AC
  Administered 2017-06-06 (×3): 10 meq via INTRAVENOUS
  Filled 2017-06-06 (×3): qty 100

## 2017-06-06 MED ORDER — POTASSIUM CHLORIDE ER 10 MEQ PO TBCR: 10.0000 meq | EXTENDED_RELEASE_TABLET | Freq: Every day | ORAL | 0 refills | Status: DC

## 2017-06-06 MED ORDER — ONDANSETRON 4 MG PO TBDP: 4.0000 mg | ORAL_TABLET | Freq: Three times a day (TID) | ORAL | 0 refills | Status: DC | PRN

## 2017-06-06 MED ORDER — IOPAMIDOL (ISOVUE-300) INJECTION 61%
100.0000 mL | Freq: Once | INTRAVENOUS | Status: AC | PRN
  Administered 2017-06-06: 100 mL via INTRAVENOUS

## 2017-06-06 MED ORDER — MORPHINE SULFATE (PF) 4 MG/ML IV SOLN
4.0000 mg | Freq: Once | INTRAVENOUS | Status: AC
  Administered 2017-06-06: 4 mg via INTRAVENOUS
  Filled 2017-06-06: qty 1

## 2017-06-06 MED ORDER — POTASSIUM CHLORIDE CRYS ER 20 MEQ PO TBCR
40.0000 meq | EXTENDED_RELEASE_TABLET | Freq: Once | ORAL | Status: AC
Start: 2017-06-06 — End: 2017-06-07
  Administered 2017-06-07: 40 meq via ORAL
  Filled 2017-06-06: qty 2

## 2017-06-06 MED ORDER — ONDANSETRON HCL 4 MG/2ML IJ SOLN
4.0000 mg | Freq: Once | INTRAMUSCULAR | Status: AC
  Administered 2017-06-06: 4 mg via INTRAVENOUS
  Filled 2017-06-06: qty 2

## 2017-06-06 NOTE — Discharge Instructions (Addendum)
Your labs today showed you had low potassium, sodium, and chloride levels; a lot of this is probably from dehydration. Take potassium as directed until finished, starting tomorrow since you were given today's doses here. Stay well hydrated. Continue all of your usual home medications.  Your CT scan of your abdomen did not show a cause of your belly pain; it did show a fibroid in your uterus and gallstones but otherwise nothing new. Use zofran as directed as needed for nausea. Alternate between tylenol and ibuprofen for pain. You may use over the counter medications such as zantac, pepto, maalox, tums, etc to help with additional relief of symptoms. Follow up with your regular doctor in 5-7 days for recheck of symptoms and to recheck your lab work. Return to the ER for emergent changes or worsening symptoms.   Abdominal (belly) pain can be caused by many things. Your caregiver performed an examination and possibly ordered blood/urine tests and imaging (CT scan, x-rays, ultrasound). Many cases can be observed and treated at home after initial evaluation in the emergency department. Even though you are being discharged home, abdominal pain can be unpredictable. Therefore, you need a repeated exam if your pain does not resolve, returns, or worsens. Most patients with abdominal pain don't have to be admitted to the hospital or have surgery, but serious problems like appendicitis and gallbladder attacks can start out as nonspecific pain. Many abdominal conditions cannot be diagnosed in one visit, so follow-up evaluations are very important. SEEK IMMEDIATE MEDICAL ATTENTION IF YOU DEVELOP ANY OF THE FOLLOWING SYMPTOMS: The pain does not go away or becomes severe.  A temperature above 101 develops.  Repeated vomiting occurs (multiple episodes).  The pain becomes localized to portions of the abdomen. The right side could possibly be appendicitis. In an adult, the left lower portion of the abdomen could be colitis or  diverticulitis.  Blood is being passed in stools or vomit (bright red or black tarry stools).  Return also if you develop chest pain, difficulty breathing, dizziness or fainting, or become confused, poorly responsive, or inconsolable (young children). The constipation stays for more than 4 days.  There is belly (abdominal) or rectal pain.  You do not seem to be getting better.

## 2017-06-06 NOTE — ED Provider Notes (Signed)
[place] DEPT Provider Note   CSN: [id number] Arrival date & time: 06/06/17  1327     History   Chief Complaint Chief Complaint  Patient presents with  . Abdominal Pain    HPI Brittany Summers is a 62 y.o. female with a PMHx of HTN, cholelithiasis, GERD, diverticulosis, internal hemorrhoids, and HepC s/p Harvoni treatment and cure, who presents to the ED with complaints of severe sudden onset left lower quadrant abdominal pain that began at 3 AM.  Patient states that on Friday, 3 days ago, she had some mild pain that went away fairly quickly and she was doing well until 3 AM this morning when suddenly she woke up with intense LLQ pain.  She describes the pain as 10/10 at onset but currently 5/10, constant twisting and burning sensation in the LLQ, radiating into the left lower back, with no known aggravating factors, and unrelieved with Prilosec and Pepto-Bismol.  She reports associated nausea and 5 episodes of nonbloody nonbilious emesis.  She states that her pain was so severe initially that she got sweaty and felt very unwell, but while she was waiting in the lobby her pain started to subside and is now better.  She has not eaten anything since 6 PM yesterday when she had an apple.  She states that in the past she has had issues with H. pylori as well as indigestion, but this usually goes away with Prilosec or Pepto-Bismol, and she states that today's pain is different than the H.pylori/GERD issues she's had before.  She had a colonoscopy on 05/11/16 by Dr. Silverio Decamp of [facility], which showed several polyps, small mouthed diverticula of the sigmoid colon, and internal hemorrhoids.  Her PCP is Dr. [name] of [facility].  She had a glass of red wine last night, drinks 1 glass of wine every 2 days or so.  She denies fevers, chills, CP, SOB, diarrhea/constipation, obstipation, melena, hematochezia, hematemesis, hematuria, dysuria, vaginal bleeding/discharge,  myalgias, arthralgias, numbness, tingling, focal weakness, or any other complaints at this time. Denies recent travel, sick contacts, suspicious food intake, frequent NSAID use, or prior abd surgeries.  She takes Benazepril-HCTZ 20-12.5mg  for BP and vitamin D but otherwise doesn't take any other medications.   The history is provided by the patient and medical records. No language interpreter was used.  Abdominal Pain   Associated symptoms include nausea and vomiting. Pertinent negatives include fever, diarrhea, constipation, dysuria, hematuria, arthralgias and myalgias.    Past Medical History:  Diagnosis Date  . Chronic hepatitis C without hepatic coma ([place]) 09/22/2016  . Hypertension     Patient Active Problem List   Diagnosis Date Noted  . Hypertension 06/21/2016    Past Surgical History:  Procedure Laterality Date  . BREAST BIOPSY Left 2017  . MAXILLARY SINUS LIFT    . MOUTH SURGERY    . TONSILLECTOMY     age 59     OB History    Gravida  1   Para      Term      Preterm      AB  1   Living        SAB  1   TAB      Ectopic      Multiple      Live Births               Home Medications    Prior to Admission medications   Medication Sig Start Date End  Date Taking? Authorizing Provider  benazepril-hydrochlorthiazide (LOTENSIN HCT) 20-12.5 MG tablet Take 2 tablets by mouth daily. 05/23/17   Shawnee Knapp, MD  cholecalciferol (VITAMIN D) 1000 units tablet Take 1,000 Units by mouth daily.    [provider]    Family History Family History  Problem Relation Age of Onset  . Hypertension Mother   . Breast cancer Mother   . Hypertension Father   . Diabetes Father   . Cancer Father   . Leukemia Father   . Breast cancer Maternal Grandmother   . Stomach cancer Maternal Grandmother   . Colon cancer Neg Hx   . Colon polyps Neg Hx   . Rectal cancer Neg Hx   . Esophageal cancer Neg Hx     Social History Social History   Tobacco Use  . Smoking  status: Former Smoker    Types: Cigarettes    Last attempt to quit: 07/15/2015    Years since quitting: 1.8  . Smokeless tobacco: Never Used  Substance Use Topics  . Alcohol use: No    Alcohol/week: 0.0 oz    Comment: as of 04-28-16 no alcohol   . Drug use: No     Allergies   Penicillins   Review of Systems Review of Systems  Constitutional: Negative for chills and fever.  Respiratory: Negative for shortness of breath.   Cardiovascular: Negative for chest pain.  Gastrointestinal: Positive for abdominal pain, nausea and vomiting. Negative for blood in stool, constipation and diarrhea.  Genitourinary: Negative for dysuria, hematuria, vaginal bleeding and vaginal discharge.  Musculoskeletal: Negative for arthralgias and myalgias.  Skin: Negative for color change.  Allergic/Immunologic: Negative for immunocompromised state.  Neurological: Negative for weakness and numbness.  Psychiatric/Behavioral: Negative for confusion.   All other systems reviewed and are negative for acute change except as noted in the HPI.    Physical Exam Updated Vital Signs BP (!) 175/93 (BP Location: Left Arm)   Pulse (!) 104   Temp 97.6 F (36.4 C) (Oral)   Resp 18   LMP  (LMP Unknown)   SpO2 100%   Physical Exam  Constitutional: She is oriented to person, place, and time. Vital signs are normal. She appears well-developed and well-nourished.  Non-toxic appearance. No distress.  Afebrile, nontoxic, NAD  HENT:  Head: Normocephalic and atraumatic.  Mouth/Throat: Oropharynx is clear and moist. Mucous membranes are dry.  Lips dry  Eyes: Conjunctivae and EOM are normal. Right eye exhibits no discharge. Left eye exhibits no discharge.  Neck: Normal range of motion. Neck supple.  Cardiovascular: Regular rhythm, normal heart sounds and intact distal pulses. Tachycardia present. Exam reveals no gallop and no friction rub.  No murmur heard. HR upper 90s-lower 100s during exam, reg rhythm, nl s1/s2, no  m/r/g, distal pulses intact, no pedal edema noted   Pulmonary/Chest: Effort normal and breath sounds normal. No respiratory distress. She has no decreased breath sounds. She has no wheezes. She has no rhonchi. She has no rales.  Abdominal: Soft. Normal appearance and bowel sounds are normal. She exhibits no distension. There is tenderness in the left lower quadrant. There is no rigidity, no rebound, no guarding, no CVA tenderness, no tenderness at McBurney's point and negative Murphy's sign.  Soft, nondistended, +BS throughout, with mild LLQ TTP, no r/g/r, neg murphy's, neg mcburney's, no CVA TTP   Musculoskeletal: Normal range of motion.  Neurological: She is alert and oriented to person, place, and time. She has normal strength. No sensory deficit.  Skin:  Skin is warm, dry and intact. No rash noted.  Psychiatric: She has a normal mood and affect.  Nursing note and vitals reviewed.    ED Treatments / Results  Labs (all labs ordered are listed, but only abnormal results are displayed) Labs Reviewed  COMPREHENSIVE METABOLIC PANEL - Abnormal; Notable for the following components:      Result Value   Sodium 127 (*)    Potassium 2.8 (*)    Chloride 87 (*)    Glucose, Bld 138 (*)    Total Protein 8.2 (*)    Anion gap 16 (*)    All other components within normal limits  URINALYSIS, ROUTINE W REFLEX MICROSCOPIC - Abnormal; Notable for the following components:   Ketones, ur 80 (*)    Protein, ur 30 (*)    All other components within normal limits  LIPASE, BLOOD  CBC  MAGNESIUM    EKG EKG Interpretation  Date/Time:  Monday Jun 06 2017 19:58:51 EDT Ventricular Rate:  89 PR Interval:    QRS Duration: 104 QT Interval:  401 QTC Calculation: 488 R Axis:   81 Text Interpretation:  Sinus rhythm no ST/T wave abnormality norMAL ekg, NO OLD COMPARISON Confirmed by Charlesetta Shanks [phone]) on 06/06/2017 9:13:00 PM   Radiology Ct Abdomen Pelvis W Contrast  Result Date: 06/06/2017 CLINICAL  DATA:  63 year old with abdominal pain. Suspect diverticulitis. EXAM: CT ABDOMEN AND PELVIS WITH CONTRAST TECHNIQUE: Multidetector CT imaging of the abdomen and pelvis was performed using the standard protocol following bolus administration of intravenous contrast. CONTRAST:  110mL ISOVUE-300 IOPAMIDOL (ISOVUE-300) INJECTION 61% COMPARISON:  Chest CT 04/27/2017 and 04/26/2016 FINDINGS: Lower chest: Lung bases are essentially clear. There is a tiny nodular density in the medial right lower lobe on sequence 4, image 18 that is unchanged since 2018 and likely benign. No large pleural effusions. Hepatobiliary: There may be a small stone near the base of the gallbladder or cystic duct. No significant gallbladder inflammation. Mild intrahepatic biliary dilatation. Common bile duct measures roughly 9 mm. No clear evidence for a distal biliary obstruction or mass. Main portal venous system is patent. There is some artifact involving the liver. No definite hepatic lesion. Pancreas: Normal appearance of the pancreas without inflammation or duct dilatation. Spleen: Normal in size without focal abnormality. Adrenals/Urinary Tract: Normal adrenal glands. Mild distention of the urinary bladder. Normal appearance of both kidneys without hydronephrosis. Stomach/Bowel: Stomach is within normal limits. Appendix appears normal. No evidence of bowel wall thickening, distention, or inflammatory changes. Vascular/Lymphatic: Atherosclerotic calcifications involving the aorta without aneurysm. Venous structures are unremarkable. No significant lymph node enlargement in the abdomen or pelvis. Reproductive: Uterus may be retroverted but difficult to evaluate due to the urinary bladder distension. There is a 3.9 cm structure involving the uterus and this is most compatible with a fibroid. Few calcifications in the uterus. Adnexal structures are unremarkable. Other: No free fluid.  Negative for free air. Musculoskeletal: Disc space narrowing  and vacuum disc phenomenon at L5-S1. IMPRESSION: No acute inflammatory changes in the abdomen or pelvis. Distention of the urinary bladder. Common bile duct is mildly prominent but no clear evidence for an obstructing lesion or stone. There may be a small stone in the gallbladder or cystic duct. If this is the area of concern, consider further characterization with right upper quadrant ultrasound. Uterine fibroid. Electronically Signed   By: Markus Daft M.D.   On: 06/06/2017 21:25    Procedures Procedures (including critical care time)  Medications Ordered in  ED Medications  potassium chloride 10 mEq in 100 mL IVPB (10 mEq Intravenous New Bag/Given 06/06/17 2208)  potassium chloride SA (K-DUR,KLOR-CON) CR tablet 40 mEq (has no administration in time range)  sodium chloride 0.9 % bolus 1,000 mL (0 mLs Intravenous Stopped 06/06/17 2034)  ondansetron (ZOFRAN) injection 4 mg (4 mg Intravenous Given 06/06/17 1946)  morphine 4 MG/ML injection 4 mg (4 mg Intravenous Given 06/06/17 1947)  iopamidol (ISOVUE-300) 61 % injection 100 mL (100 mLs Intravenous Contrast Given 06/06/17 2039)     Initial Impression / Assessment and Plan / ED Course  I have reviewed the triage vital signs and the nursing notes.  Pertinent labs & imaging results that were available during my care of the patient were reviewed by me and considered in my medical decision making (see chart for details).     62 y.o. female here with abd pain/n/v that hit suddenly at 3am; had some pain Friday but nothing severe and it went away, was fine until 3am today; pain started improving gradually after waiting in the lobby for several hours. On exam, mild LLQ TTP, nonperitoneal, no flank tenderness; lips dry; HR upper 90s-lower 100s during exam. Work up thus far reveals: U/A with protein and ketones, likely from mild dehydration; lipase WNL; CBC WNL; CMP with low Na 127, Cl 87, K 2.8, and marginally elevated anion gap 16. Some of this is probably  from dehydration. Will add-on Mg level and EKG, and give IV potassium replacement for now; will get CT abd/pelv to further evaluate her symptoms given how severe the pain was initially (concern for diverticulitis with possible perf since pain started improving); will give pain meds, nausea meds, fluids, and potassium replacement. Will reassess shortly.   10:58 PM Mg level WNL at 1.8. EKG unremarkable. CT abd/pelv without acute inflammatory changes, shows distension of the urinary bladder probably just transient from the fluids that were given; also shows prominent common bile duct but no obstruction; and uterine fibroid. Pain is in LLQ, not RUQ, and she has no tenderness to the RUQ nor does she had a murphy's sign on exam or any abnormal LFTs, doubt need for further imaging of the gallbladder at this time. Overall reassuring CT. Unclear etiology to pt's pain, but work up today was reassuring. Pt feeling better and tolerating PO well. BP improving and HR improving with fluids/pain control. Pt's potassium is not finished, but once this is done she will be ready for d/c. Will d/c with potassium repletion orally for a few days, rx zofran, advised tylenol/motrin/OTC meds for pain/symptom control, and discussed f/up with PCP in 5-7 days for recheck symptoms and lab work. Doubt need for repeat BMP today, her hyponatremia/chloremia was likely just from dehydration, and that likely cause the mild anion gap, but clinically she's well appearing and not symptomatic so doubt need for repeat BMP today. Strict return precautions advised. Discussed case with my attending Dr. Johnney Killian who agrees with plan. I explained the diagnosis and have given explicit precautions to return to the ER including for any other new or worsening symptoms. The patient understands and accepts the medical plan as it's been dictated and I have answered their questions. Discharge instructions concerning home care and prescriptions have been given. The  patient is STABLE and is to be discharged home once her potassium finishes; nursing staff knows to discharge once this is done; Lorre Munroe PA-C also aware of plan, if any issues arise then nursing staff will discuss with him. Pt stable  at this time.    Final Clinical Impressions(s) / ED Diagnoses   Final diagnoses:  LLQ abdominal pain  Nausea and vomiting in adult patient  Hypokalemia  Hyponatremia  Hypochloremia  Uterine leiomyoma, unspecified location  Essential hypertension    ED Discharge Orders        Ordered    potassium chloride (K-DUR) 10 MEQ tablet  Daily     06/06/17 2203    ondansetron (ZOFRAN ODT) 4 MG disintegrating tablet  Every 8 hours PRN     06/06/17 [address], [place], [place] 06/06/17 2301    Charlesetta Shanks, MD 06/12/17 [phone]

## 2017-06-06 NOTE — ED Triage Notes (Signed)
Per pt, states abdominal pain, N/V on and off since Saturday-states she has a history of H-Pylori-states pain is relieved after sh vomits

## 2017-06-08 ENCOUNTER — Ambulatory Visit (INDEPENDENT_AMBULATORY_CARE_PROVIDER_SITE_OTHER): Payer: BLUE CROSS/BLUE SHIELD | Admitting: Family Medicine

## 2017-06-08 ENCOUNTER — Encounter: Payer: Self-pay | Admitting: Family Medicine

## 2017-06-08 ENCOUNTER — Other Ambulatory Visit: Payer: Self-pay

## 2017-06-08 VITALS — BP 140/76 | HR 92 | Temp 98.6°F | Resp 18 | Ht 61.0 in | Wt 152.4 lb

## 2017-06-08 DIAGNOSIS — K219 Gastro-esophageal reflux disease without esophagitis: Secondary | ICD-10-CM

## 2017-06-08 DIAGNOSIS — Z8619 Personal history of other infectious and parasitic diseases: Secondary | ICD-10-CM

## 2017-06-08 DIAGNOSIS — R1032 Left lower quadrant pain: Secondary | ICD-10-CM | POA: Diagnosis not present

## 2017-06-08 LAB — POCT URINALYSIS DIP (MANUAL ENTRY)
BILIRUBIN UA: NEGATIVE
GLUCOSE UA: NEGATIVE mg/dL
Leukocytes, UA: NEGATIVE
NITRITE UA: NEGATIVE
Protein Ur, POC: NEGATIVE mg/dL
Spec Grav, UA: <=1.005 — AB (ref 1.010–1.025)
Urobilinogen, UA: 0.2 E.U./dL
pH, UA: 6 (ref 5.0–8.0)

## 2017-06-08 LAB — POC MICROSCOPIC URINALYSIS (UMFC): MUCUS RE: ABSENT

## 2017-06-08 NOTE — Progress Notes (Signed)
Subjective:  By signing my name below, I, Moises Blood, attest that this documentation has been prepared under the direction and in the presence of [name], MD. Electronically Signed: Moises Blood, [place]. 06/08/2017 , 3:59 PM .  Patient was seen in Room 2 .   Patient ID: Brittany Summers, female    DOB: 1955/01/31, 62 y.o.   MRN: [id number] Chief Complaint  Patient presents with  . Hospitalization Follow-up    pt was seen in the ED for abdominal pain 05/20. Pt states pain and nausea is gone currently. Pt states after she eats certain things she will pain, nausea, bloating. Pt states the ED wanted her electrolytes checked again.   HPI Brittany Summers is a 62 y.o. female who presents to Primary Care at [facility] for hospitalization follow up. Patient was seen in the ED for abdominal pain 2 days ago. Patient woke up 2 mornings ago at around 3:00AM feeling abdominal discomfort with bloating. She got out of bed at around 6:30AM and took some Pepto, which usually gave her relief in the past. She drank some ginger ale and threw up afterwards. She checked herself in the mirror and described her skin as "snow white" pale, and sweaty. She vomited 4 times at home as she couldn't keep anything down. Her pain worsened with associated hyperventilating and shaking. She still has a burning sensation with pain and bloating in her stomach, even with a salad last night. She drank ginger ale to burp and belch for relief. She notes pain radiates into her back. She was able to eat [place] yogurt with Vanilla protein shake this morning. Overall, her abdominal pain has improved. She has a history of gall stones with gall bladder pain before. She's cut out trigger foods like ice cream and alcohol. She had a colonoscopy done last year. She denies vaginal bleeding, or dyspareunia. She denies constipation. Last pap smear 2 years prior with negative high risk HPV, positive trichomonas vaginosis; of note, trich was absent on in  house wet prep done that day.   She has mentioned having occasional low back pain. She notes previously having pain in both glutes after working and standing for 12 hour shifts at [employer]. Now, she's noticed if she stands for an extended amount of time, for example vacuuming, she has some pain where she has to sit down before resuming.   She has dental surgery scheduled for tomorrow.   Past Medical History:  Diagnosis Date  . Chronic hepatitis C without hepatic coma ([place]) 09/22/2016  . Hypertension    Past Surgical History:  Procedure Laterality Date  . BREAST BIOPSY Left 2017  . MAXILLARY SINUS LIFT    . MOUTH SURGERY    . TONSILLECTOMY     age 8   Prior to Admission medications   Medication Sig Start Date End Date Taking? Authorizing Provider  azithromycin (ZITHROMAX) 250 MG tablet START TAKING 2 DAYS PRIOR TO SURGERY THEN TK UTD 05/24/17  Yes [provider]  benazepril-hydrochlorthiazide (LOTENSIN HCT) 20-12.5 MG tablet Take 2 tablets by mouth daily. Patient taking differently: Take 1 tablet by mouth 2 (two) times daily.  05/23/17  Yes Shawnee Knapp, MD  cholecalciferol (VITAMIN D) 1000 units tablet Take 1,000 Units by mouth daily.   Yes [provider]  HYDROcodone-acetaminophen (NORCO) 7.5-325 MG tablet TK 1/2 TO 1 T PO Q 4 TO 6 H PRF MODERATE TO SEVERE PAIN 05/24/17  Yes [provider]  ketorolac (TORADOL) 10 MG tablet TK  1 T PO QID. START 6 H AFTER IV INJECTION 05/24/17  Yes [provider]  ondansetron (ZOFRAN ODT) 4 MG disintegrating tablet Take 1 tablet (4 mg total) by mouth every 8 (eight) hours as needed for nausea or vomiting. 06/06/17  Yes Street, [place], PA-C  potassium chloride (K-DUR) 10 MEQ tablet Take 1 tablet (10 mEq total) by mouth daily for 3 days. x3 days, Starting 06/07/17 06/07/17 06/10/17 Yes Street, [place], PA-C   Allergies  Allergen Reactions  . Penicillins     Has patient had a PCN reaction causing immediate rash,  facial/tongue/throat swelling, SOB or lightheadedness with hypotension: Y Has patient had a PCN reaction causing severe rash involving mucus membranes or skin necrosis: Y Has patient had a PCN reaction that required hospitalization: N Has patient had a PCN reaction occurring within the last 10 years: Y If all of the above answers are "NO", then may proceed with Cephalosporin use.    Family History  Problem Relation Age of Onset  . Hypertension Mother   . Breast cancer Mother   . Hypertension Father   . Diabetes Father   . Cancer Father   . Leukemia Father   . Breast cancer Maternal Grandmother   . Stomach cancer Maternal Grandmother   . Colon cancer Neg Hx   . Colon polyps Neg Hx   . Rectal cancer Neg Hx   . Esophageal cancer Neg Hx    Social History   Socioeconomic History  . Marital status: Married    Spouse name: Not on file  . Number of children: Not on file  . Years of education: Not on file  . Highest education level: Not on file  Occupational History  . Not on file  Social Needs  . Financial resource strain: Not on file  . Food insecurity:    Worry: Not on file    Inability: Not on file  . Transportation needs:    Medical: Not on file    Non-medical: Not on file  Tobacco Use  . Smoking status: Former Smoker    Types: Cigarettes    Last attempt to quit: 07/15/2015    Years since quitting: 1.9  . Smokeless tobacco: Never Used  Substance and Sexual Activity  . Alcohol use: No    Alcohol/week: 0.0 oz    Comment: as of 04-28-16 no alcohol   . Drug use: No  . Sexual activity: Not Currently    Partners: Male  Lifestyle  . Physical activity:    Days per week: Not on file    Minutes per session: Not on file  . Stress: Not on file  Relationships  . Social connections:    Talks on phone: Not on file    Gets together: Not on file    Attends religious service: Not on file    Active member of club or organization: Not on file    Attends meetings of clubs or  organizations: Not on file    Relationship status: Not on file  Other Topics Concern  . Not on file  Social History Narrative  . Not on file   Depression screen [facility] 2/9 06/08/2017 04/25/2017 03/24/2017 09/22/2016 06/21/2016  Decreased Interest 0 0 0 0 0  Down, Depressed, Hopeless 0 0 0 0 0  PHQ - 2 Score 0 0 0 0 0  Altered sleeping - - - - -  Tired, decreased energy - - - - -  Change in appetite - - - - -  Feeling bad or failure about yourself  - - - - -  Trouble concentrating - - - - -  Moving slowly or fidgety/restless - - - - -  Suicidal thoughts - - - - -  PHQ-9 Score - - - - -  Difficult doing work/chores - - - - -    Review of Systems  Constitutional: Negative for chills, fatigue, fever and unexpected weight change.  Respiratory: Negative for cough.   Gastrointestinal: Positive for abdominal distention. Negative for abdominal pain (improved), constipation, diarrhea, nausea and vomiting.  Genitourinary: Negative for dyspareunia, dysuria and vaginal bleeding.  Skin: Negative for rash and wound.  Neurological: Negative for dizziness, weakness and headaches.       Objective:   Physical Exam  Constitutional: She is oriented to person, place, and time. She appears well-developed and well-nourished. No distress.  HENT:  Head: Normocephalic and atraumatic.  Eyes: Pupils are equal, round, and reactive to light. EOM are normal.  Neck: Neck supple.  Cardiovascular: Normal rate.  Pulmonary/Chest: Effort normal. No respiratory distress.  Abdominal: Bowel sounds are normal.  Musculoskeletal: Normal range of motion.  Neurological: She is alert and oriented to person, place, and time.  Skin: Skin is warm and dry.  Psychiatric: She has a normal mood and affect. Her behavior is normal.  Nursing note and vitals reviewed.   BP 140/76 (BP Location: Left Arm, Patient Position: Sitting, Cuff Size: Normal)   Pulse 92   Temp 98.6 F (37 C) (Oral)   Resp 18   Ht 5\' 1"  (1.549 m)   Wt 152 lb  6.4 oz (69.1 kg)   LMP  (LMP Unknown)   SpO2 99%   BMI 28.80 kg/m      Assessment & Plan:   1. Abdominal pain, left lower quadrant   2. Gastroesophageal reflux disease, esophagitis presence not specified   3. History of trichomonal vaginitis     Orders Placed This Encounter  Procedures  . Trichomonas vaginalis, RNA  . Urine Culture  . Comprehensive metabolic panel  . CBC with Differential/Platelet  . H. pylori breath test    Standing Status:   Future    Number of Occurrences:   1    Standing Expiration Date:   06/09/2018  . Ambulatory referral to Gastroenterology    Referral Priority:   Routine    Referral Type:   Consultation    Referral Reason:   Specialty Services Required    Referred to Provider:   Mauri Pole, MD    Number of Visits Requested:   1  . POCT urinalysis dipstick  . POCT Microscopic Urinalysis ([facility])  . POC Hemoccult Bld/Stl (3-Cd Home Screen)    Standing Status:   Future    Standing Expiration Date:   06/09/2018    I personally performed the services described in this documentation, which was scribed in my presence. The recorded information has been reviewed and considered, and addended by me as needed.   [name], M.D.  Primary Care at [facility] [address] [place], [place] [address] [phone] phone [phone] fax  06/30/17 9:03 AM

## 2017-06-08 NOTE — Patient Instructions (Addendum)
Come back for a lab only visit in about 3 weeks when you have been off of peptobismol, tums, zantac, pepcid, omeprazole, any medicine ending in -pazole (nexium, protonix, prilosec, aciphex, prevacid) for the H. Pylori breath test.  If you continue to have recurrence of symptoms, next step is repeating your pelvic exam so you might want to schedule an appointment in a month that you can cancel if everything goes away.  Do the stool cards.    IF you received an x-ray today, you will receive an invoice from [facility]. Please contact [facility] at [phone] with questions or concerns regarding your invoice.   IF you received labwork today, you will receive an invoice from [facility]. Please contact [facility] at [phone] with questions or concerns regarding your invoice.   Our billing staff will not be able to assist you with questions regarding bills from these companies.  You will be contacted with the lab results as soon as they are available. The fastest way to get your results is to activate your My Chart account. Instructions are located on the last page of this paperwork. If you have not heard from [place] regarding the results in 2 weeks, please contact this office.     Food Choices for Gastroesophageal Reflux Disease, Adult When you have gastroesophageal reflux disease (GERD), the foods you eat and your eating habits are very important. Choosing the right foods can help ease your discomfort. What guidelines do I need to follow?  Choose fruits, vegetables, whole grains, and low-fat dairy products.  Choose low-fat meat, fish, and poultry.  Limit fats such as oils, salad dressings, butter, nuts, and avocado.  Keep a food diary. This helps you identify foods that cause symptoms.  Avoid foods that cause symptoms. These may be different for everyone.  Eat small meals often instead of 3 large meals a day.  Eat your meals slowly, in a place where you are  relaxed.  Limit fried foods.  Cook foods using methods other than frying.  Avoid drinking alcohol.  Avoid drinking large amounts of liquids with your meals.  Avoid bending over or lying down until 2-3 hours after eating. What foods are not recommended? These are some foods and drinks that may make your symptoms worse: Vegetables Tomatoes. Tomato juice. Tomato and spaghetti sauce. Chili peppers. Onion and garlic. Horseradish. Fruits Oranges, grapefruit, and lemon (fruit and juice). Meats High-fat meats, fish, and poultry. This includes hot dogs, ribs, ham, sausage, salami, and bacon. Dairy Whole milk and chocolate milk. Sour cream. Cream. Butter. Ice cream. Cream cheese. Drinks Coffee and tea. Bubbly (carbonated) drinks or energy drinks. Condiments Hot sauce. Barbecue sauce. Sweets/Desserts Chocolate and cocoa. Donuts. Peppermint and spearmint. Fats and Oils High-fat foods. This includes [place] fries and potato chips. Other Vinegar. Strong spices. This includes black pepper, white pepper, red pepper, cayenne, curry powder, cloves, ginger, and chili powder. The items listed above may not be a complete list of foods and drinks to avoid. Contact your dietitian for more information. This information is not intended to replace advice given to you by your health care provider. Make sure you discuss any questions you have with your health care provider. Document Released: 07/06/2011 Document Revised: 06/12/2015 Document Reviewed: 11/08/2012 Elsevier Interactive Patient Education  2017 [place] Diet for Pancreatitis or Gallbladder Conditions A low-fat diet can be helpful if you have pancreatitis or a gallbladder condition. With these conditions, your pancreas and gallbladder have trouble digesting fats. A healthy eating plan with  less fat will help rest your pancreas and gallbladder and reduce your symptoms. What do I need to know about this diet?  Eat a low-fat  diet. ? Reduce your fat intake to less than 20-30% of your total daily calories. This is less than 50-60 g of fat per day. ? Remember that you need some fat in your diet. Ask your dietician what your daily goal should be. ? Choose nonfat and low-fat healthy foods. Look for the words "nonfat," "low fat," or "fat free." ? As a guide, look on the label and choose foods with less than 3 g of fat per serving. Eat only one serving.  Avoid alcohol.  Do not smoke. If you need help quitting, talk with your health care provider.  Eat small frequent meals instead of three large heavy meals. What foods can I eat? Grains Include healthy grains and starches such as potatoes, wheat bread, fiber-rich cereal, and brown rice. Choose whole grain options whenever possible. In adults, whole grains should account for 45-65% of your daily calories. Fruits and Vegetables Eat plenty of fruits and vegetables. Fresh fruits and vegetables add fiber to your diet. Meats and Other Protein Sources Eat lean meat such as chicken and pork. Trim any fat off of meat before cooking it. Eggs, fish, and beans are other sources of protein. In adults, these foods should account for 10-35% of your daily calories. Dairy Choose low-fat milk and dairy options. Dairy includes fat and protein, as well as calcium. Fats and Oils Limit high-fat foods such as fried foods, sweets, baked goods, sugary drinks. Other Creamy sauces and condiments, such as mayonnaise, can add extra fat. Think about whether or not you need to use them, or use smaller amounts or low fat options. What foods are not recommended?  High fat foods, such as: ? [employer]. ? Ice cream. ? [place] toast. ? Sweet rolls. ? Pizza. ? Cheese bread. ? Foods covered with batter, butter, creamy sauces, or cheese. ? Fried foods. ? Sugary drinks and desserts.  Foods that cause gas or bloating This information is not intended to replace advice given to you by your health  care provider. Make sure you discuss any questions you have with your health care provider. Document Released: 01/09/2013 Document Revised: 06/12/2015 Document Reviewed: 12/18/2012 Elsevier Interactive Patient Education  2017 [employer].

## 2017-06-09 LAB — COMPREHENSIVE METABOLIC PANEL
A/G RATIO: 1.6 (ref 1.2–2.2)
ALBUMIN: 4.6 g/dL (ref 3.6–4.8)
ALK PHOS: 54 IU/L (ref 39–117)
ALT: 15 IU/L (ref 0–32)
AST: 22 IU/L (ref 0–40)
BILIRUBIN TOTAL: 0.5 mg/dL (ref 0.0–1.2)
BUN / CREAT RATIO: 22 (ref 12–28)
BUN: 11 mg/dL (ref 8–27)
CO2: 26 mmol/L (ref 20–29)
Calcium: 9.9 mg/dL (ref 8.7–10.3)
Chloride: 89 mmol/L — ABNORMAL LOW (ref 96–106)
Creatinine, Ser: 0.5 mg/dL — ABNORMAL LOW (ref 0.57–1.00)
GFR calc Af Amer: 119 mL/min/{1.73_m2} (ref >59)
GFR calc non Af Amer: 103 mL/min/{1.73_m2} (ref >59)
GLOBULIN, TOTAL: 2.9 g/dL (ref 1.5–4.5)
Glucose: 94 mg/dL (ref 65–99)
POTASSIUM: 3.8 mmol/L (ref 3.5–5.2)
SODIUM: 130 mmol/L — AB (ref 134–144)
Total Protein: 7.5 g/dL (ref 6.0–8.5)

## 2017-06-09 LAB — CBC WITH DIFFERENTIAL/PLATELET
BASOS: 0 %
Basophils Absolute: 0 10*3/uL (ref 0.0–0.2)
EOS (ABSOLUTE): 0.1 10*3/uL (ref 0.0–0.4)
Eos: 1 %
HEMATOCRIT: 36.3 % (ref 34.0–46.6)
HEMOGLOBIN: 12.2 g/dL (ref 11.1–15.9)
IMMATURE GRANULOCYTES: 0 %
Immature Grans (Abs): 0 10*3/uL (ref 0.0–0.1)
LYMPHS: 26 %
Lymphocytes Absolute: 2.3 10*3/uL (ref 0.7–3.1)
MCH: 29.8 pg (ref 26.6–33.0)
MCHC: 33.6 g/dL (ref 31.5–35.7)
MCV: 89 fL (ref 79–97)
Monocytes Absolute: 0.7 10*3/uL (ref 0.1–0.9)
Monocytes: 8 %
NEUTROS PCT: 65 %
Neutrophils Absolute: 5.9 10*3/uL (ref 1.4–7.0)
Platelets: 313 10*3/uL (ref 150–450)
RBC: 4.09 x10E6/uL (ref 3.77–5.28)
RDW: 12.7 % (ref 12.3–15.4)
WBC: 9.1 10*3/uL (ref 3.4–10.8)

## 2017-06-09 LAB — URINE CULTURE: ORGANISM ID, BACTERIA: NO GROWTH

## 2017-06-09 LAB — TRICHOMONAS VAGINALIS, PROBE AMP: Trich vag by NAA: NEGATIVE

## 2017-06-30 ENCOUNTER — Ambulatory Visit (INDEPENDENT_AMBULATORY_CARE_PROVIDER_SITE_OTHER): Payer: BLUE CROSS/BLUE SHIELD | Admitting: Family Medicine

## 2017-06-30 ENCOUNTER — Other Ambulatory Visit: Payer: Self-pay

## 2017-06-30 DIAGNOSIS — R1032 Left lower quadrant pain: Secondary | ICD-10-CM

## 2017-06-30 DIAGNOSIS — K219 Gastro-esophageal reflux disease without esophagitis: Secondary | ICD-10-CM

## 2017-06-30 LAB — POC HEMOCCULT BLD/STL (HOME/3-CARD/SCREEN)
Card #2 Fecal Occult Blod, POC: NEGATIVE
Card #3 Fecal Occult Blood, POC: NEGATIVE
Fecal Occult Blood, POC: NEGATIVE

## 2017-06-30 NOTE — Progress Notes (Signed)
Pt is here for a lab only visit. Pt was not seen by provider.

## 2017-07-01 LAB — H. PYLORI BREATH TEST: H PYLORI BREATH TEST: NEGATIVE

## 2017-07-18 ENCOUNTER — Ambulatory Visit
Admission: RE | Admit: 2017-07-18 | Discharge: 2017-07-18 | Disposition: A | Discharge status: Home / Self Care | Payer: BLUE CROSS/BLUE SHIELD | Source: Ambulatory Visit | Attending: Family Medicine | Admitting: Family Medicine

## 2017-07-18 DIAGNOSIS — M8580 Other specified disorders of bone density and structure, unspecified site: Secondary | ICD-10-CM | POA: Insufficient documentation

## 2017-07-18 DIAGNOSIS — M810 Age-related osteoporosis without current pathological fracture: Secondary | ICD-10-CM

## 2017-07-18 DIAGNOSIS — E2839 Other primary ovarian failure: Secondary | ICD-10-CM

## 2017-07-18 DIAGNOSIS — Z780 Asymptomatic menopausal state: Secondary | ICD-10-CM | POA: Insufficient documentation

## 2017-07-18 DIAGNOSIS — Z1231 Encounter for screening mammogram for malignant neoplasm of breast: Secondary | ICD-10-CM

## 2017-08-02 ENCOUNTER — Encounter: Payer: Self-pay | Admitting: Family Medicine

## 2017-08-05 ENCOUNTER — Other Ambulatory Visit: Payer: Self-pay | Admitting: Family Medicine

## 2017-08-30 ENCOUNTER — Encounter: Payer: Self-pay | Admitting: Family Medicine

## 2017-09-06 ENCOUNTER — Other Ambulatory Visit: Payer: Self-pay | Admitting: Family Medicine

## 2017-09-07 ENCOUNTER — Encounter: Payer: Self-pay | Admitting: Family Medicine

## 2017-09-12 ENCOUNTER — Encounter: Payer: Self-pay | Admitting: Family Medicine

## 2017-09-12 ENCOUNTER — Ambulatory Visit: Payer: BLUE CROSS/BLUE SHIELD | Admitting: Family Medicine

## 2017-09-12 ENCOUNTER — Other Ambulatory Visit: Payer: Self-pay

## 2017-09-12 VITALS — BP 122/70 | HR 72 | Temp 98.0°F | Resp 16 | Ht 61.02 in | Wt 150.0 lb

## 2017-09-12 DIAGNOSIS — M810 Age-related osteoporosis without current pathological fracture: Secondary | ICD-10-CM | POA: Diagnosis not present

## 2017-09-12 DIAGNOSIS — M8580 Other specified disorders of bone density and structure, unspecified site: Secondary | ICD-10-CM

## 2017-09-12 DIAGNOSIS — I10 Essential (primary) hypertension: Secondary | ICD-10-CM

## 2017-09-12 DIAGNOSIS — Z780 Asymptomatic menopausal state: Secondary | ICD-10-CM

## 2017-09-12 DIAGNOSIS — R05 Cough: Secondary | ICD-10-CM | POA: Diagnosis not present

## 2017-09-12 DIAGNOSIS — R059 Cough, unspecified: Secondary | ICD-10-CM

## 2017-09-12 DIAGNOSIS — Z87891 Personal history of nicotine dependence: Secondary | ICD-10-CM | POA: Diagnosis not present

## 2017-09-12 DIAGNOSIS — Z23 Encounter for immunization: Secondary | ICD-10-CM

## 2017-09-12 MED ORDER — OLMESARTAN MEDOXOMIL 40 MG PO TABS: 40.0000 mg | ORAL_TABLET | Freq: Every day | ORAL | 1 refills | Status: DC

## 2017-09-12 NOTE — Progress Notes (Signed)
Subjective:    Patient ID: Brittany Summers, female    DOB: 1955-02-13, 62 y.o.   MRN: [id number] Chief Complaint  Patient presents with  . Hypertension    6 month follow-up    HPI HTN: Pt was initially on losartan-hctz 50-12.5 6 mos ago which was then doubled to get BP control but after just 2 mos the med was on back order due to recall so was changed to benazepril -hctz 40-25 >3 mos ago. But since that time she has noticed an irritating cough.  Though her BP has been great at 110s-120s/80s but she notes that her resting HR while <100 is above her normal level Last yr she was on hctz along but was insufficient to control her BP.  She was having some hyponatremia which she was acutely ill with GI issues in May 3 mos ago which were likely due to emesis and the hctz.  H/o tobacco use: Stopped with Chantix 2 yrs ago - summer 2017. Had LDCT of chest to screen for lung cancer 04/27/2017 and order is in for this to be repeated in 1 yr. It did show mild diffuse bronchial wall thickening with mild centrilobular and paraseptal emphysema suggestive of underlying COPD.  Had normal spirometry done 06/2016 with FEV1/FVC 77.6% and FEV1 87% pred and FVC 86% pred.  Osteopenia: Had nml mammogram 07/18/17 and DEXA bone scan which showed some mild osteopenia with T score -1.7 at L spine. vit D 1000u a day supplement. Weight-bearing exercise - is doing weight training and aerobic working out a lot. Drinks milk in cereal and coffee and will have a cottage cheese daily and fresh fruit and veggies.   Weight gain: since stopping smoking but now has lost 7 lbs BMI Readings from Last 5 Encounters:  06/08/17 28.80 kg/m  04/25/17 29.82 kg/m  03/24/17 29.02 kg/m  09/22/16 27.40 kg/m  06/21/16 27.44 kg/m   Her husband is my pt and she is concerned about his memory. However, when she attempts to discuss it with him, he withdrawals and is not interested in discussing further.  Past Medical History:  Diagnosis  Date  . Chronic hepatitis C without hepatic coma ([place]) 09/22/2016  . Gallstone    known with h/o cholecystitis pain, no recent recurrence  . Hypertension    Past Surgical History:  Procedure Laterality Date  . BREAST BIOPSY Left 2017  . MAXILLARY SINUS LIFT    . MOUTH SURGERY    . TONSILLECTOMY     age 76   Current Outpatient Medications on File Prior to Visit  Medication Sig Dispense Refill  . cholecalciferol (VITAMIN D) 1000 units tablet Take 1,000 Units by mouth daily.     No current facility-administered medications on file prior to visit.    Allergies  Allergen Reactions  . Penicillins     Has patient had a PCN reaction causing immediate rash, facial/tongue/throat swelling, SOB or lightheadedness with hypotension: Y Has patient had a PCN reaction causing severe rash involving mucus membranes or skin necrosis: Y Has patient had a PCN reaction that required hospitalization: N Has patient had a PCN reaction occurring within the last 10 years: Y If all of the above answers are "NO", then may proceed with Cephalosporin use.    Family History  Problem Relation Age of Onset  . Hypertension Mother   . Breast cancer Mother   . Hypertension Father   . Diabetes Father   . Leukemia Father   . Breast cancer Maternal Grandmother   .  Stomach cancer Maternal Grandmother 90  . Colon cancer Maternal Uncle   . Colon polyps Neg Hx   . Rectal cancer Neg Hx   . Esophageal cancer Neg Hx    Social History   Socioeconomic History  . Marital status: Married    Spouse name: Not on file  . Number of children: Not on file  . Years of education: Not on file  . Highest education level: Not on file  Occupational History  . Not on file  Social Needs  . Financial resource strain: Not on file  . Food insecurity:    Worry: Not on file    Inability: Not on file  . Transportation needs:    Medical: Not on file    Non-medical: Not on file  Tobacco Use  . Smoking status: Former Smoker     Types: Cigarettes    Last attempt to quit: 07/15/2015    Years since quitting: 2.3  . Smokeless tobacco: Never Used  Substance and Sexual Activity  . Alcohol use: No    Alcohol/week: 0.0 standard drinks    Comment: as of 04-28-16 no alcohol   . Drug use: No  . Sexual activity: Not Currently    Partners: Male  Lifestyle  . Physical activity:    Days per week: Not on file    Minutes per session: Not on file  . Stress: Not on file  Relationships  . Social connections:    Talks on phone: Not on file    Gets together: Not on file    Attends religious service: Not on file    Active member of club or organization: Not on file    Attends meetings of clubs or organizations: Not on file    Relationship status: Not on file  Other Topics Concern  . Not on file  Social History Narrative  . Not on file   Depression screen [facility] 2/9 09/12/2017 06/08/2017 04/25/2017 03/24/2017 09/22/2016  Decreased Interest 0 0 0 0 0  Down, Depressed, Hopeless 0 0 0 0 0  PHQ - 2 Score 0 0 0 0 0  Altered sleeping - - - - -  Tired, decreased energy - - - - -  Change in appetite - - - - -  Feeling bad or failure about yourself  - - - - -  Trouble concentrating - - - - -  Moving slowly or fidgety/restless - - - - -  Suicidal thoughts - - - - -  PHQ-9 Score - - - - -  Difficult doing work/chores - - - - -     Review of Systems See hpi    Objective:   Physical Exam  Constitutional: She is oriented to person, place, and time. She appears well-developed and well-nourished. No distress.  HENT:  Head: Normocephalic and atraumatic.  Right Ear: External ear normal.  Left Ear: External ear normal.  Eyes: Conjunctivae are normal. No scleral icterus.  Neck: Normal range of motion. Neck supple. No thyromegaly present.  Cardiovascular: Normal rate, regular rhythm, normal heart sounds and intact distal pulses.  Pulmonary/Chest: Effort normal and breath sounds normal. No respiratory distress.  Musculoskeletal: She  exhibits no edema.  Lymphadenopathy:    She has no cervical adenopathy.  Neurological: She is alert and oriented to person, place, and time.  Skin: Skin is warm and dry. She is not diaphoretic. No erythema.  Psychiatric: She has a normal mood and affect. Her behavior is normal.    BP 122/70  Pulse 72   Temp 98 F (36.7 C) (Oral)   Resp 16   Ht 5' 1.02" (1.55 m)   Wt 150 lb (68 kg)   LMP  (LMP Unknown)   SpO2 97%   PF 400 L/min   BMI 28.32 kg/m    Predicted peak flow: 423; pt's peak flow 400 L/min Assessment & Plan:  Consider checking vit D - will do with next labs, recheck na?  Change to olmesartan-amlodipine or valsartan-amlodipine if we want to try to leave off the hctz due to recent hyponatremia? Annual CPE next March.  1. Essential hypertension   2. Osteopenia after menopause   3. History of tobacco use   4. Cough   5. Need for prophylactic vaccination and inoculation against influenza     Orders Placed This Encounter  Procedures  . Flu Vaccine QUAD 36+ mos IM  . Care order/instruction:    Scheduling Instructions:     [facility] (IF NEB IS ORDERED PLEASE DO BEFORE AND AFTER NEB)    Meds ordered this encounter  Medications  . olmesartan (BENICAR) 40 MG tablet    Sig: Take 1 tablet (40 mg total) by mouth daily.    Dispense:  90 tablet    Refill:  1    [name], MD, MPH Primary Care at [facility] [place], [place]  [address] [phone] Office phone  [phone] Office fax   11/07/17 1:14 AM

## 2017-09-12 NOTE — Patient Instructions (Addendum)
Consider starting a 400-500mg  calcium chew daily    If you have lab work done today you will be contacted with your lab results within the next 2 weeks.  If you have not heard from [place] then please contact us. The fastest way to get your results is to register for My Chart.   IF you received an x-ray today, you will receive an invoice from [facility]. Please contact [facility] at [phone] with questions or concerns regarding your invoice.   IF you received labwork today, you will receive an invoice from [facility]. Please contact [facility] at [phone] with questions or concerns regarding your invoice.   Our billing staff will not be able to assist you with questions regarding bills from these companies.  You will be contacted with the lab results as soon as they are available. The fastest way to get your results is to activate your My Chart account. Instructions are located on the last page of this paperwork. If you have not heard from [place] regarding the results in 2 weeks, please contact this office.     Managing Your Hypertension Hypertension is commonly called high blood pressure. This is when the force of your blood pressing against the walls of your arteries is too strong. Arteries are blood vessels that carry blood from your heart throughout your body. Hypertension forces the heart to work harder to pump blood, and may cause the arteries to become narrow or stiff. Having untreated or uncontrolled hypertension can cause heart attack, stroke, kidney disease, and other problems. What are blood pressure readings? A blood pressure reading consists of a higher number over a lower number. Ideally, your blood pressure should be below 120/80. The first ("top") number is called the systolic pressure. It is a measure of the pressure in your arteries as your heart beats. The second ("bottom") number is called the diastolic pressure. It is a measure of the pressure in your arteries  as the heart relaxes. What does my blood pressure reading mean? Blood pressure is classified into four stages. Based on your blood pressure reading, your health care provider may use the following stages to determine what type of treatment you need, if any. Systolic pressure and diastolic pressure are measured in a unit called mm Hg. Normal  Systolic pressure: below 440.  Diastolic pressure: below 80. Elevated  Systolic pressure: 347-425.  Diastolic pressure: below 80. Hypertension stage 1  Systolic pressure: 956-387.  Diastolic pressure: 56-43. Hypertension stage 2  Systolic pressure: 329 or above.  Diastolic pressure: 90 or above. What health risks are associated with hypertension? Managing your hypertension is an important responsibility. Uncontrolled hypertension can lead to:  A heart attack.  A stroke.  A weakened blood vessel (aneurysm).  Heart failure.  Kidney damage.  Eye damage.  Metabolic syndrome.  Memory and concentration problems.  What changes can I make to manage my hypertension? Hypertension can be managed by making lifestyle changes and possibly by taking medicines. Your health care provider will help you make a plan to bring your blood pressure within a normal range. Eating and drinking  Eat a diet that is high in fiber and potassium, and low in salt (sodium), added sugar, and fat. An example eating plan is called the DASH (Dietary Approaches to Stop Hypertension) diet. To eat this way: ? Eat plenty of fresh fruits and vegetables. Try to fill half of your plate at each meal with fruits and vegetables. ? Eat whole grains, such as whole wheat pasta,  brown rice, or whole grain bread. Fill about one quarter of your plate with whole grains. ? Eat low-fat diary products. ? Avoid fatty cuts of meat, processed or cured meats, and poultry with skin. Fill about one quarter of your plate with lean proteins such as fish, chicken without skin, beans, eggs, and  tofu. ? Avoid premade and processed foods. These tend to be higher in sodium, added sugar, and fat.  Reduce your daily sodium intake. Most people with hypertension should eat less than 1,500 mg of sodium a day.  Limit alcohol intake to no more than 1 drink a day for nonpregnant women and 2 drinks a day for men. One drink equals 12 oz of beer, 5 oz of wine, or 1 oz of hard liquor. Lifestyle  Work with your health care provider to maintain a healthy body weight, or to lose weight. Ask what an ideal weight is for you.  Get at least 30 minutes of exercise that causes your heart to beat faster (aerobic exercise) most days of the week. Activities may include walking, swimming, or biking.  Include exercise to strengthen your muscles (resistance exercise), such as weight lifting, as part of your weekly exercise routine. Try to do these types of exercises for 30 minutes at least 3 days a week.  Do not use any products that contain nicotine or tobacco, such as cigarettes and e-cigarettes. If you need help quitting, ask your health care provider.  Control any long-term (chronic) conditions you have, such as high cholesterol or diabetes. Monitoring  Monitor your blood pressure at home as told by your health care provider. Your personal target blood pressure may vary depending on your medical conditions, your age, and other factors.  Have your blood pressure checked regularly, as often as told by your health care provider. Working with your health care provider  Review all the medicines you take with your health care provider because there may be side effects or interactions.  Talk with your health care provider about your diet, exercise habits, and other lifestyle factors that may be contributing to hypertension.  Visit your health care provider regularly. Your health care provider can help you create and adjust your plan for managing hypertension. Will I need medicine to control my blood  pressure? Your health care provider may prescribe medicine if lifestyle changes are not enough to get your blood pressure under control, and if:  Your systolic blood pressure is 130 or higher.  Your diastolic blood pressure is 80 or higher.  Take medicines only as told by your health care provider. Follow the directions carefully. Blood pressure medicines must be taken as prescribed. The medicine does not work as well when you skip doses. Skipping doses also puts you at risk for problems. Contact a health care provider if:  You think you are having a reaction to medicines you have taken.  You have repeated (recurrent) headaches.  You feel dizzy.  You have swelling in your ankles.  You have trouble with your vision. Get help right away if:  You develop a severe headache or confusion.  You have unusual weakness or numbness, or you feel faint.  You have severe pain in your chest or abdomen.  You vomit repeatedly.  You have trouble breathing. Summary  Hypertension is when the force of blood pumping through your arteries is too strong. If this condition is not controlled, it may put you at risk for serious complications.  Your personal target blood pressure may vary depending  on your medical conditions, your age, and other factors. For most people, a normal blood pressure is less than 120/80.  Hypertension is managed by lifestyle changes, medicines, or both. Lifestyle changes include weight loss, eating a healthy, low-sodium diet, exercising more, and limiting alcohol. This information is not intended to replace advice given to you by your health care provider. Make sure you discuss any questions you have with your health care provider. Document Released: 09/29/2011 Document Revised: 12/03/2015 Document Reviewed: 12/03/2015 Elsevier Interactive Patient Education  [employer].

## 2017-09-23 ENCOUNTER — Ambulatory Visit: Payer: BLUE CROSS/BLUE SHIELD | Admitting: Gastroenterology

## 2017-09-23 ENCOUNTER — Encounter: Payer: Self-pay | Admitting: Gastroenterology

## 2017-09-23 VITALS — BP 156/80 | HR 80 | Ht 60.5 in | Wt 150.5 lb

## 2017-09-23 DIAGNOSIS — R112 Nausea with vomiting, unspecified: Secondary | ICD-10-CM

## 2017-09-23 DIAGNOSIS — R1013 Epigastric pain: Secondary | ICD-10-CM | POA: Diagnosis not present

## 2017-09-23 NOTE — Patient Instructions (Signed)
You have been scheduled for an endoscopy. Please follow written instructions given to you at your visit today. If you use inhalers (even only as needed), please bring them with you on the day of your procedure. Your physician has requested that you go to www.startemmi.com and enter the access code given to you at your visit today. This web site gives a general overview about your procedure. However, you should still follow specific instructions given to you by our office regarding your preparation for the procedure.  Thank you for choosing [facility]  Karleen Hampshire Nandigam,MD

## 2017-09-23 NOTE — Progress Notes (Signed)
Brittany Summers    [id number]    October 18, 1955  Primary Care Physician:Shaw, Laurey Arrow, MD  Referring Physician: Shawnee Knapp, MD [address] [place], [place] [address]  Chief complaint: Abdominal pain HPI: 62 year old female with history of hypertension, GERD, hepatitis C status post Harvoni treatment with complaints of left-sided abdominal pain.  She presented to ER in May 2019 after she had an episode of severe epigastric and left sided abdominal pain that woke her up from sleep.  She has had these intermittent episodes once every 1 to 2 years for past 20 years.  She has history of cholelithiasis.  She feels well in between the episodes. During the last episode she felt flulike symptoms with nausea, vomiting, joint pain, diffuse abdominal bloating, loss of appetite and periumbilical pain that lasted for 1 to 2 days Denies any changes in bowel habits.  No weight loss In the ER CBC, lipase, LFT were normal.  Mildly elevated anion gap with hypokalemia potassium 2.8, sodium 127 and chloride 87.  EKG was unremarkable.  CT abdomen and pelvis negative for any acute pathology, CBD mildly prominent but no clear evidence of obstructive lesion or stone,  small stone in the gallbladder neck or cystic duct.   Outpatient Encounter Medications as of 09/23/2017  Medication Sig  . cholecalciferol (VITAMIN D) 1000 units tablet Take 1,000 Units by mouth daily.  Marland Kitchen olmesartan (BENICAR) 40 MG tablet Take 1 tablet (40 mg total) by mouth daily.   No facility-administered encounter medications on file as of 09/23/2017.     Allergies as of 09/23/2017 - Review Complete 09/23/2017  Allergen Reaction Noted  . Penicillins  06/27/2015    Past Medical History:  Diagnosis Date  . Chronic hepatitis C without hepatic coma ([place]) 09/22/2016  . Gallstone    known with h/o cholecystitis pain, no recent recurrence  . Hypertension     Past Surgical History:  Procedure Laterality Date  . BREAST BIOPSY Left 2017    . MAXILLARY SINUS LIFT    . MOUTH SURGERY    . TONSILLECTOMY     age 89    Family History  Problem Relation Age of Onset  . Hypertension Mother   . Breast cancer Mother   . Hypertension Father   . Diabetes Father   . Leukemia Father   . Breast cancer Maternal Grandmother   . Stomach cancer Maternal Grandmother 90  . Colon cancer Maternal Uncle   . Colon polyps Neg Hx   . Rectal cancer Neg Hx   . Esophageal cancer Neg Hx     Social History   Socioeconomic History  . Marital status: Married    Spouse name: Not on file  . Number of children: Not on file  . Years of education: Not on file  . Highest education level: Not on file  Occupational History  . Not on file  Social Needs  . Financial resource strain: Not on file  . Food insecurity:    Worry: Not on file    Inability: Not on file  . Transportation needs:    Medical: Not on file    Non-medical: Not on file  Tobacco Use  . Smoking status: Former Smoker    Types: Cigarettes    Last attempt to quit: 07/15/2015    Years since quitting: 2.1  . Smokeless tobacco: Never Used  Substance and Sexual Activity  . Alcohol use: No    Alcohol/week: 0.0  standard drinks    Comment: as of 04-28-16 no alcohol   . Drug use: No  . Sexual activity: Not Currently    Partners: Male  Lifestyle  . Physical activity:    Days per week: Not on file    Minutes per session: Not on file  . Stress: Not on file  Relationships  . Social connections:    Talks on phone: Not on file    Gets together: Not on file    Attends religious service: Not on file    Active member of club or organization: Not on file    Attends meetings of clubs or organizations: Not on file    Relationship status: Not on file  . Intimate partner violence:    Fear of current or ex partner: Not on file    Emotionally abused: Not on file    Physically abused: Not on file    Forced sexual activity: Not on file  Other Topics Concern  . Not on file  Social History  Narrative  . Not on file      Review of systems: Review of Systems  Constitutional: Negative for fever and chills.  HENT: Negative.   Eyes: Negative for blurred vision.  Respiratory: Negative for cough, shortness of breath and wheezing.   Cardiovascular: Negative for chest pain and palpitations.  Gastrointestinal: as per HPI Genitourinary: Negative for dysuria, urgency, frequency and hematuria.  Musculoskeletal: Negative for myalgias, back pain and joint pain.  Skin: Negative for itching and rash.  Neurological: Negative for dizziness, tremors, focal weakness, seizures and loss of consciousness.  Endo/Heme/Allergies: Positive for seasonal allergies.  Psychiatric/Behavioral: Negative for depression, suicidal ideas and hallucinations.  All other systems reviewed and are negative.   Physical Exam: Vitals:   09/23/17 1344  BP: (!) 156/80  Pulse: 80   Body mass index is 28.91 kg/m. Gen:      No acute distress HEENT:  EOMI, sclera anicteric Neck:     No masses; no thyromegaly Lungs:    Clear to auscultation bilaterally; normal respiratory effort CV:         Regular rate and rhythm; no murmurs Abd:      + bowel sounds; soft, non-tender; no palpable masses, no distension Ext:    No edema; adequate peripheral perfusion Skin:      Warm and dry; no rash Neuro: alert and oriented x 3 Psych: normal mood and affect  Data Reviewed:  Reviewed labs, radiology imaging, old records and pertinent past GI work up   Assessment and Plan/Recommendations: 62 year old female with history of hepatitis C status post treatment with Harvoni achieved SVR, hypertension, chronic GERD and cholelithiasis here for evaluation of epigastric, left-sided and periumbilical abdominal pain which is episodic associated with nausea and vomiting Presentation is concerning for possible intermittent mild pancreatitis/biliary disease due to passage of small biliary stone or sludge. Will proceed with EGD first to  exclude peptic ulcer disease If EGD normal, will plan MRCP to further evaluate and may have to consider cholecystectomy  The risks and benefits as well as alternatives of endoscopic procedure(s) have been discussed and reviewed. All questions answered. The patient agrees to proceed. Due for surveillance colonoscopy April 2021, multiple adenomatous polyps were removed.   Damaris Hippo , MD [phone]    CC: Shawnee Knapp, MD

## 2017-09-30 ENCOUNTER — Encounter: Payer: Self-pay | Admitting: Gastroenterology

## 2017-09-30 ENCOUNTER — Ambulatory Visit (AMBULATORY_SURGERY_CENTER): Payer: BLUE CROSS/BLUE SHIELD | Admitting: Gastroenterology

## 2017-09-30 VITALS — BP 136/85 | HR 67 | Temp 97.8°F | Resp 11 | Ht 60.5 in | Wt 150.0 lb

## 2017-09-30 DIAGNOSIS — R1013 Epigastric pain: Secondary | ICD-10-CM | POA: Diagnosis not present

## 2017-09-30 DIAGNOSIS — R112 Nausea with vomiting, unspecified: Secondary | ICD-10-CM

## 2017-09-30 DIAGNOSIS — K2970 Gastritis, unspecified, without bleeding: Secondary | ICD-10-CM | POA: Diagnosis not present

## 2017-09-30 MED ORDER — OMEPRAZOLE 20 MG PO CPDR: 20.0000 mg | DELAYED_RELEASE_CAPSULE | Freq: Every day | ORAL | 0 refills | Status: DC

## 2017-09-30 MED ORDER — SODIUM CHLORIDE 0.9 % IV SOLN
500.0000 mL | Freq: Once | INTRAVENOUS | Status: DC
Start: 2017-09-30 — End: 2017-09-30

## 2017-09-30 NOTE — Patient Instructions (Addendum)
YOU HAD AN ENDOSCOPIC PROCEDURE TODAY AT [facility]:   Refer to the procedure report that was given to you for any specific questions about what was found during the examination.  If the procedure report does not answer your questions, please call your gastroenterologist to clarify.  If you requested that your care partner not be given the details of your procedure findings, then the procedure report has been included in a sealed envelope for you to review at your convenience later.  YOU SHOULD EXPECT: Some feelings of bloating in the abdomen. Passage of more gas than usual.  Walking can help get rid of the air that was put into your GI tract during the procedure and reduce the bloating. If you had a lower endoscopy (such as a colonoscopy or flexible sigmoidoscopy) you may notice spotting of blood in your stool or on the toilet paper. If you underwent a bowel prep for your procedure, you may not have a normal bowel movement for a few days.  Please Note:  You might notice some irritation and congestion in your nose or some drainage.  This is from the oxygen used during your procedure.  There is no need for concern and it should clear up in a day or so.  SYMPTOMS TO REPORT IMMEDIATELY:    Following upper endoscopy (EGD)  Vomiting of blood or coffee ground material  New chest pain or pain under the shoulder blades  Painful or persistently difficult swallowing  New shortness of breath  Fever of 100F or higher  Black, tarry-looking stools  For urgent or emergent issues, a gastroenterologist can be reached at any hour by calling [phone].   DIET:  We do recommend a small meal at first, but then you may proceed to your regular diet.  Drink plenty of fluids but you should avoid alcoholic beverages for 24 hours.  ACTIVITY:  You should plan to take it easy for the rest of today and you should NOT DRIVE or use heavy machinery until tomorrow (because of the sedation medicines used  during the test).    FOLLOW UP: Our staff will call the number listed on your records the next business day following your procedure to check on you and address any questions or concerns that you may have regarding the information given to you following your procedure. If we do not reach you, we will leave a message.  However, if you are feeling well and you are not experiencing any problems, there is no need to return our call.  We will assume that you have returned to your regular daily activities without incident.  If any biopsies were taken you will be contacted by phone or by letter within the next 1-3 weeks.  Please call us at [phone] if you have not heard about the biopsies in 3 weeks.    SIGNATURES/CONFIDENTIALITY: You and/or your care partner have signed paperwork which will be entered into your electronic medical record.  These signatures attest to the fact that that the information above on your After Visit Summary has been reviewed and is understood.  Full responsibility of the confidentiality of this discharge information lies with you and/or your care-partner.    Handouts were given to your care partner on GERD - antireflux regimen and gastritis. Rx was sent To [facility] for OMEPRAZOLE 20 mg daily on an empty stomach 20-30 minutes before breakfast. You may resume your current medications today. No aspirin, aspirin products,  ibuprofen, naproxen, advil, motrin,  aleve, or other non-steroidal anti-inflammatory drugs. Await biopsy results. The office will call you with an appointment in 2 months. Please call if any questions or concerns.

## 2017-09-30 NOTE — Progress Notes (Signed)
No problems noted in the recovery room. maw 

## 2017-09-30 NOTE — Progress Notes (Signed)
A/ox3 pleased with MAC, report to RN 

## 2017-09-30 NOTE — Progress Notes (Signed)
Called to room to assist during endoscopic procedure.  Patient ID and intended procedure confirmed with present staff. Received instructions for my participation in the procedure from the performing physician.  

## 2017-09-30 NOTE — Op Note (Signed)
[place] Patient Name: Brittany Summers Procedure Date: 09/30/2017 10:58 AM MRN: [id number] Endoscopist: Mauri Pole , MD Age: 62 Referring MD:  Date of Birth: 06-Apr-1955 Gender: Female Account #: [id number] Procedure:                Upper GI endoscopy Indications:              Epigastric abdominal pain Medicines:                Monitored Anesthesia Care Procedure:                Pre-Anesthesia Assessment:                           - Prior to the procedure, a History and Physical                            was performed, and patient medications and                            allergies were reviewed. The patient's tolerance of                            previous anesthesia was also reviewed. The risks                            and benefits of the procedure and the sedation                            options and risks were discussed with the patient.                            All questions were answered, and informed consent                            was obtained. Prior Anticoagulants: The patient has                            taken no previous anticoagulant or antiplatelet                            agents. ASA Grade Assessment: II - A patient with                            mild systemic disease. After reviewing the risks                            and benefits, the patient was deemed in                            satisfactory condition to undergo the procedure.                           After obtaining informed consent, the endoscope was  passed under direct vision. Throughout the                            procedure, the patient's blood pressure, pulse, and                            oxygen saturations were monitored continuously. The                            Endoscope was introduced through the mouth, and                            advanced to the second part of duodenum. The upper                            GI endoscopy was  accomplished without difficulty.                            The patient tolerated the procedure well. Scope In: Scope Out: Findings:                 LA Grade B (one or more mucosal breaks greater than                            5 mm, not extending between the tops of two mucosal                            folds) esophagitis with no bleeding was found 34 to                            36 cm from the incisors.                           Patchy moderate inflammation characterized by                            congestion (edema), erosions, erythema and mucus                            was found in the entire examined stomach. Biopsies                            were taken with a cold forceps for Helicobacter                            pylori testing.                           The examined duodenum was normal. Complications:            No immediate complications. Estimated Blood Loss:     Estimated blood loss was minimal. Impression:               - LA Grade B reflux esophagitis.                           -  Gastritis. Biopsied.                           - Normal examined duodenum. Recommendation:           - Patient has a contact number available for                            emergencies. The signs and symptoms of potential                            delayed complications were discussed with the                            patient. Return to normal activities tomorrow.                            Written discharge instructions were provided to the                            patient.                           - Resume previous diet.                           - Continue present medications.                           - Await pathology results.                           - No aspirin, ibuprofen, naproxen, or other                            non-steroidal anti-inflammatory drugs.                           - Follow an antireflux regimen. This includes:                           - Do not lie down for  at least 3 to 4 hours after                            meals.                           - Raise the head of the bed 4 to 6 inches.                           - Decrease excess weight.                           - Avoid citrus juices and other acidic foods,                            alcohol, chocolate, mints, coffee and  other                            caffeinated beverages, carbonated beverages, fatty                            and fried foods.                           - Avoid tight-fitting clothing.                           - Avoid cigarettes and other tobacco products.                           - Use Prilosec (omeprazole) 20 mg PO daily for 3                            months.                           - Return to GI office in 2 months. Mauri Pole, MD 09/30/2017 11:23:19 AM This report has been signed electronically.

## 2017-10-03 ENCOUNTER — Telehealth: Payer: Self-pay | Admitting: *Deleted

## 2017-10-03 ENCOUNTER — Telehealth: Payer: Self-pay

## 2017-10-03 NOTE — Telephone Encounter (Signed)
Left message

## 2017-10-03 NOTE — Telephone Encounter (Signed)
  Follow up Call-  Call back number 09/30/2017 05/11/2016  Post procedure Call Back phone  # [phone]  Permission to leave phone message Yes Yes     Patient questions:  Do you have a fever, pain , or abdominal swelling? No. Pain Score  0 *  Have you tolerated food without any problems? Yes.    Have you been able to return to your normal activities? Yes.    Do you have any questions about your discharge instructions: Diet   No. Medications  No. Follow up visit  No.  Do you have questions or concerns about your Care? No.  Actions: * If pain score is 4 or above: No action needed, pain <4.

## 2017-10-18 ENCOUNTER — Other Ambulatory Visit: Payer: Self-pay

## 2017-10-18 DIAGNOSIS — R1013 Epigastric pain: Secondary | ICD-10-CM

## 2017-10-19 ENCOUNTER — Encounter: Payer: Self-pay | Admitting: Gastroenterology

## 2017-10-24 ENCOUNTER — Ambulatory Visit: Payer: BLUE CROSS/BLUE SHIELD | Admitting: Family Medicine

## 2017-12-02 ENCOUNTER — Ambulatory Visit: Payer: BLUE CROSS/BLUE SHIELD | Admitting: Gastroenterology

## 2017-12-02 ENCOUNTER — Encounter: Payer: Self-pay | Admitting: Gastroenterology

## 2017-12-02 ENCOUNTER — Other Ambulatory Visit: Payer: BLUE CROSS/BLUE SHIELD

## 2017-12-02 VITALS — BP 124/80 | HR 82 | Ht 60.6 in | Wt 154.4 lb

## 2017-12-02 DIAGNOSIS — K8020 Calculus of gallbladder without cholecystitis without obstruction: Secondary | ICD-10-CM

## 2017-12-02 DIAGNOSIS — R1013 Epigastric pain: Secondary | ICD-10-CM

## 2017-12-02 LAB — HEPATIC FUNCTION PANEL
ALT: 13 U/L (ref 0–35)
AST: 15 U/L (ref 0–37)
Albumin: 4.6 g/dL (ref 3.5–5.2)
Alkaline Phosphatase: 50 U/L (ref 39–117)
BILIRUBIN DIRECT: 0.1 mg/dL (ref 0.0–0.3)
BILIRUBIN TOTAL: 0.6 mg/dL (ref 0.2–1.2)
Total Protein: 7.8 g/dL (ref 6.0–8.3)

## 2017-12-02 NOTE — Progress Notes (Signed)
IYLA BALZARINI    [id number]    04/07/1955  Primary Care Physician:Shaw, Laurey Arrow, MD  Referring Physician: Shawnee Knapp, MD [address] [place], [place] [address]  Chief complaint: Cholelithiasis HPI:  62 year old female with history of hypertension, chronic GERD, hep C status post treatment intermittent epigastric abdominal pain. Her pain has resolved and she has not had any episodes in the past 3 months.  She has intermittent episodes once every few months to a year. Abdominal ultrasound showed gallbladder filled with stones and CT abdomen and pelvis showed a small stone in the gallbladder neck.  Mild CBD dilation Denies any nausea, vomiting, abdominal pain, melena or bright red blood per rectum  Colonoscopy April 2018 mild diverticulosis, internal hemorrhoids and multiple sessile adenomatous polyps  EGD September 2019 LA grade B esophagitis and mild gastritis, biopsies negative for H. pylori.  Outpatient Encounter Medications as of 12/02/2017  Medication Sig  . cholecalciferol (VITAMIN D) 1000 units tablet Take 1,000 Units by mouth daily.  Marland Kitchen olmesartan (BENICAR) 40 MG tablet Take 1 tablet (40 mg total) by mouth daily.  Marland Kitchen omeprazole (PRILOSEC) 20 MG capsule Take 1 capsule (20 mg total) by mouth daily. Take on a empty stomach 20 -30 minutes before breakfast.   No facility-administered encounter medications on file as of 12/02/2017.     Allergies as of 12/02/2017 - Review Complete 12/02/2017  Allergen Reaction Noted  . Penicillins  06/27/2015    Past Medical History:  Diagnosis Date  . Chronic hepatitis C without hepatic coma ([place]) 09/22/2016  . Gallstone    known with h/o cholecystitis pain, no recent recurrence  . Hypertension     Past Surgical History:  Procedure Laterality Date  . BREAST BIOPSY Left 2017  . MAXILLARY SINUS LIFT    . MOUTH SURGERY    . TONSILLECTOMY     age 49    Family History  Problem Relation Age of Onset  . Hypertension Mother    . Breast cancer Mother        Dx in her early 74's  . Hypertension Father   . Diabetes Father   . Leukemia Father   . Breast cancer Maternal Grandmother   . Stomach cancer Maternal Grandmother 90  . Colon cancer Maternal Uncle   . Colon polyps Neg Hx   . Rectal cancer Neg Hx   . Esophageal cancer Neg Hx     Social History   Socioeconomic History  . Marital status: Married    Spouse name: Not on file  . Number of children: 0  . Years of education: Not on file  . Highest education level: Not on file  Occupational History  . Occupation: retired  Scientific laboratory technician  . Financial resource strain: Not on file  . Food insecurity:    Worry: Not on file    Inability: Not on file  . Transportation needs:    Medical: Not on file    Non-medical: Not on file  Tobacco Use  . Smoking status: Former Smoker    Types: Cigarettes    Last attempt to quit: 07/15/2015    Years since quitting: 2.3  . Smokeless tobacco: Never Used  Substance and Sexual Activity  . Alcohol use: No    Alcohol/week: 0.0 standard drinks    Comment: as of 04-28-16 no alcohol   . Drug use: No  . Sexual activity: Not Currently    Partners: Male  Lifestyle  .  Physical activity:    Days per week: Not on file    Minutes per session: Not on file  . Stress: Not on file  Relationships  . Social connections:    Talks on phone: Not on file    Gets together: Not on file    Attends religious service: Not on file    Active member of club or organization: Not on file    Attends meetings of clubs or organizations: Not on file    Relationship status: Not on file  . Intimate partner violence:    Fear of current or ex partner: Not on file    Emotionally abused: Not on file    Physically abused: Not on file    Forced sexual activity: Not on file  Other Topics Concern  . Not on file  Social History Narrative  . Not on file      Review of systems: Review of Systems  Constitutional: Negative for fever and chills.    HENT: Negative.   Eyes: Negative for blurred vision.  Respiratory: Negative for cough, shortness of breath and wheezing.   Cardiovascular: Negative for chest pain and palpitations.  Gastrointestinal: as per HPI Genitourinary: Negative for dysuria, urgency, frequency and hematuria.  Musculoskeletal: Negative for myalgias, back pain and joint pain.  Skin: Negative for itching and rash.  Neurological: Negative for dizziness, tremors, focal weakness, seizures and loss of consciousness.  Endo/Heme/Allergies: Negative for seasonal allergies.  Psychiatric/Behavioral: Negative for depression, suicidal ideas and hallucinations.  All other systems reviewed and are negative.   Physical Exam: Vitals:   12/02/17 0844  BP: 124/80  Pulse: 82   Body mass index is 29.56 kg/m. Gen:      No acute distress HEENT:  EOMI, sclera anicteric Neck:     No masses; no thyromegaly Lungs:    Clear to auscultation bilaterally; normal respiratory effort CV:         Regular rate and rhythm; no murmurs Abd:      + bowel sounds; soft, non-tender; no palpable masses, no distension Ext:    No edema; adequate peripheral perfusion Skin:      Warm and dry; no rash Neuro: alert and oriented x 3 Psych: normal mood and affect  Data Reviewed:  Reviewed labs, radiology imaging, old records and pertinent past GI work up   Assessment and Plan/Recommendations:  62 year old female with history of chronic hep C s/p treatment with Harvoni, chronic GERD and cholelithiasis with episodic epigastric abdominal pain She had stone in the gallbladder neck based on CT abdomen pelvis in May 2019 when she had an severe episode of epigastric abdominal pain EGD negative for peptic ulcer disease, showed mild gastritis negative for H. Pylori Likely etiology of intermittent epigastric abdominal pain is cholelithiasis and chronic cholecystitis Check LFT Repeat abdominal ultrasound to check for gallbladder wall thickening, CBD dilation  or persistent stone in neck of gallbladder Referral to Dr. Barry Dienes at [facility] to evaluate for possible cholecystectomy  25 minutes was spent face-to-face with the patient. Greater than 50% of the time used for counseling as well as treatment plan and follow-up. She had multiple questions which were answered to her satisfaction  K. Denzil Magnuson , MD [phone]    CC: Shawnee Knapp, MD

## 2017-12-02 NOTE — Patient Instructions (Signed)
You have been scheduled for an abdominal ultrasound at [facility] (1st floor of hospital) on 12/06/2017 at 10:30am. Please arrive 15 minutes prior to your appointment for registration. Make certain not to have anything to eat or drink after midnight prior to your appointment. Should you need to reschedule your appointment, please contact radiology at [phone]. This test typically takes about 30 minutes to perform.  Go to the basement for labs today  We will refer you to [facility] to see Dr Barry Dienes for Cholecystectomy  Follow up in 1 year   If you are age 26 or older, your body mass index should be between 23-30. Your Body mass index is 29.56 kg/m. If this is out of the aforementioned range listed, please consider follow up with your Primary Care Provider.  If you are age 15 or younger, your body mass index should be between 19-25. Your Body mass index is 29.56 kg/m. If this is out of the aformentioned range listed, please consider follow up with your Primary Care Provider.    Thank you for choosing [facility]  Karleen Hampshire Nandigam,MD

## 2017-12-06 ENCOUNTER — Encounter: Payer: Self-pay | Admitting: Gastroenterology

## 2017-12-06 ENCOUNTER — Ambulatory Visit (HOSPITAL_COMMUNITY)
Admission: RE | Admit: 2017-12-06 | Discharge: 2017-12-06 | Disposition: A | Discharge status: Home / Self Care | Payer: BLUE CROSS/BLUE SHIELD | Source: Ambulatory Visit | Attending: Gastroenterology | Admitting: Gastroenterology

## 2017-12-06 DIAGNOSIS — R1013 Epigastric pain: Secondary | ICD-10-CM

## 2017-12-06 DIAGNOSIS — K8020 Calculus of gallbladder without cholecystitis without obstruction: Secondary | ICD-10-CM | POA: Diagnosis not present

## 2018-02-14 ENCOUNTER — Other Ambulatory Visit: Payer: Self-pay | Admitting: Gastroenterology

## 2018-02-14 DIAGNOSIS — K2970 Gastritis, unspecified, without bleeding: Secondary | ICD-10-CM

## 2018-02-14 DIAGNOSIS — R112 Nausea with vomiting, unspecified: Secondary | ICD-10-CM

## 2018-02-14 DIAGNOSIS — R1013 Epigastric pain: Secondary | ICD-10-CM

## 2018-02-20 ENCOUNTER — Telehealth: Payer: Self-pay | Admitting: *Deleted

## 2018-02-20 NOTE — Telephone Encounter (Signed)
Patient was scheduled on 01/27/2018 with Dr Barry Dienes for consult for gallbladder surgery but patient cancelled appointment and has not rescheduled

## 2018-03-13 ENCOUNTER — Other Ambulatory Visit: Payer: Self-pay | Admitting: Family Medicine

## 2018-03-13 NOTE — Telephone Encounter (Signed)
Requested medication (s) are due for refill today: yes  Requested medication (s) are on the active medication list: yes  Last refill:  09/12/17 #90 with 1 refill  Future visit scheduled: yes, 08/03/18  Notes to clinic:  Appt scheduled on 03/16/18 cancelled. Pt requesting refill of medication. Future appt not scheduled until 08/03/18    Requested Prescriptions  Pending Prescriptions Disp Refills   olmesartan (BENICAR) 40 MG tablet 90 tablet 1    Sig: Take 1 tablet (40 mg total) by mouth daily.     Cardiovascular:  Angiotensin Receptor Blockers Failed - 03/13/2018  6:58 PM      Failed - Cr in normal range and within 180 days    Creat  Date Value Ref Range Status  09/22/2016 0.45 (L) 0.50 - 0.99 mg/dL Final    Comment:    For patients >74 years of age, the reference limit for Creatinine is approximately 13% higher for people identified as African-American. .    Creatinine, Ser  Date Value Ref Range Status  06/08/2017 0.50 (L) 0.57 - 1.00 mg/dL Final         Failed - K in normal range and within 180 days    Potassium  Date Value Ref Range Status  06/08/2017 3.8 3.5 - 5.2 mmol/L Final         Failed - Valid encounter within last 6 months    Recent Outpatient Visits          6 months ago Essential hypertension   [facility], MD   8 months ago Abdominal pain, left lower quadrant   [facility], Laurey Arrow, MD   9 months ago Abdominal pain, left lower quadrant   [facility], Laurey Arrow, MD   10 months ago Essential hypertension   [facility], Laurey Arrow, MD   11 months ago Annual physical exam   [facility], Laurey Arrow, MD      Future Appointments            In 4 months Shawnee Knapp, MD [facility] [place], [place] - Patient is not pregnant      Passed - Last BP in normal range    BP Readings from Last 1 Encounters:  12/02/17 124/80

## 2018-03-13 NOTE — Telephone Encounter (Signed)
Copied from [place] [id number]. Topic: Quick Communication - See Telephone Encounter >> Mar 13, 2018  1:55 PM Ivar Drape wrote: CRM for notification. See Telephone encounter for: 03/13/18. Patient would like a refill on her olmesartan (BENICAR) 40 MG tablet medication and have it sent to her preferred pharmacy [facility] [place] [address]. [place]

## 2018-03-16 ENCOUNTER — Encounter: Payer: BLUE CROSS/BLUE SHIELD | Admitting: Family Medicine

## 2018-03-16 MED ORDER — OLMESARTAN MEDOXOMIL 40 MG PO TABS: 40.0000 mg | ORAL_TABLET | Freq: Every day | ORAL | 0 refills | Status: DC

## 2018-03-23 ENCOUNTER — Other Ambulatory Visit: Payer: Self-pay | Admitting: Family Medicine

## 2018-03-23 NOTE — Telephone Encounter (Signed)
LOV 09/12/17 with Dr. Brigitte Pulse. She is due for OV this month, according to protocol in order to continue refills. Appointment on the chart for July with Dr. Malachi Bonds appointment will need to be rescheduled with a new provider.  TC to patient. Left VM to call and schedule appointment this month for continued refills to be provided. 30 day courtesy refill provided.

## 2018-06-23 ENCOUNTER — Other Ambulatory Visit: Payer: Self-pay | Admitting: Acute Care

## 2018-06-23 DIAGNOSIS — Z122 Encounter for screening for malignant neoplasm of respiratory organs: Secondary | ICD-10-CM

## 2018-06-23 DIAGNOSIS — Z87891 Personal history of nicotine dependence: Secondary | ICD-10-CM

## 2018-07-02 ENCOUNTER — Emergency Department (HOSPITAL_COMMUNITY)
Admission: EM | Admit: 2018-07-02 | Discharge: 2018-07-03 | Disposition: A | Discharge status: Home / Self Care | Payer: Medicare Other | Attending: Emergency Medicine | Admitting: Emergency Medicine

## 2018-07-02 ENCOUNTER — Encounter (HOSPITAL_COMMUNITY): Payer: Self-pay

## 2018-07-02 ENCOUNTER — Emergency Department (HOSPITAL_COMMUNITY): Payer: Medicare Other

## 2018-07-02 ENCOUNTER — Other Ambulatory Visit: Payer: Self-pay

## 2018-07-02 DIAGNOSIS — S82855A Nondisplaced trimalleolar fracture of left lower leg, initial encounter for closed fracture: Secondary | ICD-10-CM | POA: Diagnosis not present

## 2018-07-02 DIAGNOSIS — I10 Essential (primary) hypertension: Secondary | ICD-10-CM | POA: Insufficient documentation

## 2018-07-02 DIAGNOSIS — Y999 Unspecified external cause status: Secondary | ICD-10-CM | POA: Diagnosis not present

## 2018-07-02 DIAGNOSIS — Y929 Unspecified place or not applicable: Secondary | ICD-10-CM | POA: Insufficient documentation

## 2018-07-02 DIAGNOSIS — W108XXA Fall (on) (from) other stairs and steps, initial encounter: Secondary | ICD-10-CM | POA: Insufficient documentation

## 2018-07-02 DIAGNOSIS — S82852A Displaced trimalleolar fracture of left lower leg, initial encounter for closed fracture: Secondary | ICD-10-CM

## 2018-07-02 DIAGNOSIS — Y9389 Activity, other specified: Secondary | ICD-10-CM | POA: Insufficient documentation

## 2018-07-02 DIAGNOSIS — S99912A Unspecified injury of left ankle, initial encounter: Secondary | ICD-10-CM | POA: Diagnosis present

## 2018-07-02 DIAGNOSIS — Z87891 Personal history of nicotine dependence: Secondary | ICD-10-CM | POA: Diagnosis not present

## 2018-07-02 NOTE — ED Provider Notes (Signed)
Emergency Department Provider Note   I have reviewed the triage vital signs and the nursing notes.   HISTORY  Chief Complaint Ankle Pain   HPI Brittany Summers is a 63 y.o. female with PMH of HTN and chronic Hep C presents to the ED after fall with ankle pain.  Patient states that she was walking down steps which were uneven.  She lost her balance and was falling to the right when her left ankle turned and she felt severe pain.  She did fall to the ground without head injury.  She denies any knee pain.  She is having pain in both ankles but symptoms are worse on the left.  She is not anticoagulated.  She denies any numbness or tingling in the extremities.  No pain in the arms. No FOOSH.   Past Medical History:  Diagnosis Date  . Chronic hepatitis C without hepatic coma ([place]) 09/22/2016  . Gallstone    known with h/o cholecystitis pain, no recent recurrence  . Hypertension     Patient Active Problem List   Diagnosis Date Noted  . Osteopenia after menopause 07/18/2017  . Hypertension 06/21/2016    Past Surgical History:  Procedure Laterality Date  . BREAST BIOPSY Left 2017  . MAXILLARY SINUS LIFT    . MOUTH SURGERY    . TONSILLECTOMY     age 44    Allergies Penicillins  Family History  Problem Relation Age of Onset  . Hypertension Mother   . Breast cancer Mother        Dx in her early 8's  . Hypertension Father   . Diabetes Father   . Leukemia Father   . Breast cancer Maternal Grandmother   . Stomach cancer Maternal Grandmother 90  . Colon cancer Maternal Uncle   . Colon polyps Neg Hx   . Rectal cancer Neg Hx   . Esophageal cancer Neg Hx     Social History Social History   Tobacco Use  . Smoking status: Former Smoker    Types: Cigarettes    Quit date: 07/15/2015    Years since quitting: 2.9  . Smokeless tobacco: Never Used  Substance Use Topics  . Alcohol use: No    Alcohol/week: 0.0 standard drinks    Comment: as of 04-28-16 no alcohol   . Drug  use: No    Review of Systems  Constitutional: No fever/chills Eyes: No visual changes. ENT: No sore throat. Cardiovascular: Denies chest pain. Respiratory: Denies shortness of breath. Gastrointestinal: No abdominal pain.  No nausea, no vomiting.  No diarrhea.  No constipation. Genitourinary: Negative for dysuria. Musculoskeletal: Negative for back pain. Positive left ankle pain and swelling.  Skin: Negative for rash. Neurological: Negative for headaches, focal weakness or numbness.  10-point ROS otherwise negative.  ____________________________________________   PHYSICAL EXAM:  VITAL SIGNS: ED Triage Vitals  Enc Vitals Group     BP 07/02/18 2256 (!) 182/94     Pulse Rate 07/02/18 2256 66     Resp 07/02/18 2256 20     Temp 07/02/18 2256 98.4 F (36.9 C)     Temp Source 07/02/18 2256 Oral     SpO2 07/02/18 2247 98 %     Weight 07/02/18 2256 154 lb 5.2 oz (70 kg)     Height 07/02/18 2256 5\' 1"  (1.549 m)   Constitutional: Alert and oriented. Well appearing and in no acute distress. Eyes: Conjunctivae are normal.  Head: Atraumatic. Nose: No congestion/rhinnorhea. Mouth/Throat: Mucous membranes are  moist. Neck: No stridor.   Cardiovascular: Normal rate, regular rhythm.  Respiratory: Normal respiratory effort.  Gastrointestinal: No distention.  Musculoskeletal: Bruising of the bilateral ankles worse on the left. No laceration or open injury. No proximal fibular tenderness. Tenderness to palpation of the left ankle. Normal pulses and sensation on the left foot.  Neurologic:  Normal speech and language. No gross focal neurologic deficits are appreciated.  Skin:  Skin is warm, dry and intact. No rash noted.   ____________________________________________  RADIOLOGY  No results found.  ____________________________________________   PROCEDURES  Procedure(s) performed:   Procedures  None ____________________________________________   INITIAL IMPRESSION /  ASSESSMENT AND PLAN / ED COURSE  Pertinent labs & imaging results that were available during my care of the patient were reviewed by me and considered in my medical decision making (see chart for details).   Patient presents to the emergency department for mechanical fall.  Patient has trimalleolar fracture on the left ankle.  No dislocation or significant displacement.  Images reviewed by Dr. Lyla Glassing, orthopedics on-call, who request CT imaging of the left ankle, splint, nonweightbearing status, elevation at home above the head, and ice.  Patient to call the office tomorrow and schedule an appointment for this week.      Durable Medical Equipment  (From admission, onward)         Start     Ordered   07/03/18 0000  For home use only DME lightweight manual wheelchair with seat cushion    Comments: Patient suffers from ankle fracture which impairs their ability to perform daily activities like bathing, dressing and grooming in the home.  A cane, crutch or walker will not resolve  issue with performing activities of daily living. A wheelchair will allow patient to safely perform daily activities. Patient is not able to propel themselves in the home using a standard weight wheelchair due to arm weakness and endurance. Patient can self propel in the lightweight wheelchair. Length of need 6 months . Accessories: elevating leg rests (ELRs), wheel locks, extensions and anti-tippers.   07/03/18 0329         Discussed with Dr. Lyla Glassing. Requests CT prior to discharge for operative planning, splint, NWB status, and office follow up this week. Patient to keep leg elevated. Assessed splint prior to discharge. Appears well-fitting. Discussed ED return precautions.   ____________________________________________  FINAL CLINICAL IMPRESSION(S) / ED DIAGNOSES  Final diagnoses:  Closed trimalleolar fracture of left ankle, initial encounter     MEDICATIONS GIVEN DURING THIS VISIT:  Medications   oxyCODONE-acetaminophen (PERCOCET/ROXICET) 5-325 MG per tablet 1 tablet (1 tablet Oral Given 07/03/18 0024)  oxyCODONE-acetaminophen (PERCOCET/ROXICET) 5-325 MG per tablet 1 tablet (1 tablet Oral Given 07/03/18 5916)    Note:  This document was prepared using Dragon voice recognition software and may include unintentional dictation errors.  Nanda Quinton, MD Emergency Medicine    Ashanti Littles, [facility], MD 07/04/18 [phone]

## 2018-07-02 NOTE — ED Notes (Signed)
Bed: PZ02 Expected date:  Expected time:  Means of arrival:  Comments: 62yo F/left ankle injury

## 2018-07-02 NOTE — ED Triage Notes (Signed)
Pt arrived via [facility] after rolling her ankle causing her to fall this evening. Denies hitting her head or any LOC, no blood thinners. Swelling noted to left ankle causing 10/10 pain. Bruising to right side of foot.

## 2018-07-03 ENCOUNTER — Emergency Department (HOSPITAL_COMMUNITY): Payer: Medicare Other

## 2018-07-03 DIAGNOSIS — S82855A Nondisplaced trimalleolar fracture of left lower leg, initial encounter for closed fracture: Secondary | ICD-10-CM | POA: Diagnosis not present

## 2018-07-03 MED ORDER — OXYCODONE-ACETAMINOPHEN 5-325 MG PO TABS: 1.0000 | ORAL_TABLET | Freq: Four times a day (QID) | ORAL | 0 refills | Status: DC | PRN

## 2018-07-03 MED ORDER — OXYCODONE-ACETAMINOPHEN 5-325 MG PO TABS
1.0000 | ORAL_TABLET | Freq: Once | ORAL | Status: AC
  Administered 2018-07-03: 04:00:00 1 via ORAL
  Filled 2018-07-03: qty 1

## 2018-07-03 MED ORDER — OXYCODONE-ACETAMINOPHEN 5-325 MG PO TABS
1.0000 | ORAL_TABLET | Freq: Once | ORAL | Status: AC
  Administered 2018-07-03: 1 via ORAL
  Filled 2018-07-03: qty 1

## 2018-07-03 NOTE — Discharge Instructions (Signed)
You were seen in the emergency department today after fall with left ankle fracture.  He has been placed in a splint and will need to call orthopedics tomorrow to schedule an appointment in the coming week.  Keep the splint clean and dry.  Keep the foot elevated above your head while at rest.  Do not put weight on the left ankle.  I have written a prescription for a wheelchair that you can use for assistance.  Take the pain medication only as needed for severe pain.  You can otherwise take Tylenol and/or Motrin.  Apply ice intermittently over the next 12 hours.  Return to the emergency department with any new or suddenly worsening symptoms.

## 2018-07-04 ENCOUNTER — Telehealth: Payer: Self-pay | Admitting: *Deleted

## 2018-07-04 NOTE — Telephone Encounter (Signed)
EDCM returned call to pt requesting WC.  EDCM contacted [employer] to have WC delivered to pt home

## 2018-07-05 ENCOUNTER — Other Ambulatory Visit: Payer: Self-pay

## 2018-07-05 ENCOUNTER — Encounter (HOSPITAL_BASED_OUTPATIENT_CLINIC_OR_DEPARTMENT_OTHER): Payer: Self-pay | Admitting: *Deleted

## 2018-07-05 ENCOUNTER — Other Ambulatory Visit (HOSPITAL_COMMUNITY): Payer: Self-pay | Admitting: Orthopedic Surgery

## 2018-07-06 ENCOUNTER — Ambulatory Visit (HOSPITAL_BASED_OUTPATIENT_CLINIC_OR_DEPARTMENT_OTHER): Payer: Medicare Other | Admitting: Anesthesiology

## 2018-07-06 ENCOUNTER — Ambulatory Visit (HOSPITAL_BASED_OUTPATIENT_CLINIC_OR_DEPARTMENT_OTHER)
Admission: RE | Admit: 2018-07-06 | Discharge: 2018-07-06 | Disposition: A | Discharge status: Home / Self Care | Payer: Medicare Other | Attending: Orthopedic Surgery | Admitting: Orthopedic Surgery

## 2018-07-06 ENCOUNTER — Other Ambulatory Visit (HOSPITAL_COMMUNITY)
Admission: RE | Admit: 2018-07-06 | Discharge: 2018-07-06 | Disposition: A | Discharge status: Home / Self Care | Payer: Medicare Other | Source: Ambulatory Visit | Attending: Orthopedic Surgery | Admitting: Orthopedic Surgery

## 2018-07-06 ENCOUNTER — Encounter (HOSPITAL_BASED_OUTPATIENT_CLINIC_OR_DEPARTMENT_OTHER): Payer: Self-pay | Admitting: *Deleted

## 2018-07-06 ENCOUNTER — Other Ambulatory Visit: Payer: Self-pay

## 2018-07-06 ENCOUNTER — Encounter (HOSPITAL_BASED_OUTPATIENT_CLINIC_OR_DEPARTMENT_OTHER)
Admission: RE | Disposition: A | Discharge status: Home / Self Care | Payer: Self-pay | Source: Home / Self Care | Attending: Orthopedic Surgery

## 2018-07-06 DIAGNOSIS — B182 Chronic viral hepatitis C: Secondary | ICD-10-CM | POA: Insufficient documentation

## 2018-07-06 DIAGNOSIS — Z1159 Encounter for screening for other viral diseases: Secondary | ICD-10-CM | POA: Insufficient documentation

## 2018-07-06 DIAGNOSIS — Z806 Family history of leukemia: Secondary | ICD-10-CM | POA: Diagnosis not present

## 2018-07-06 DIAGNOSIS — Z8249 Family history of ischemic heart disease and other diseases of the circulatory system: Secondary | ICD-10-CM | POA: Insufficient documentation

## 2018-07-06 DIAGNOSIS — Z833 Family history of diabetes mellitus: Secondary | ICD-10-CM | POA: Insufficient documentation

## 2018-07-06 DIAGNOSIS — W010XXA Fall on same level from slipping, tripping and stumbling without subsequent striking against object, initial encounter: Secondary | ICD-10-CM | POA: Insufficient documentation

## 2018-07-06 DIAGNOSIS — X501XXA Overexertion from prolonged static or awkward postures, initial encounter: Secondary | ICD-10-CM | POA: Diagnosis not present

## 2018-07-06 DIAGNOSIS — Z880 Allergy status to penicillin: Secondary | ICD-10-CM | POA: Diagnosis not present

## 2018-07-06 DIAGNOSIS — Z803 Family history of malignant neoplasm of breast: Secondary | ICD-10-CM | POA: Insufficient documentation

## 2018-07-06 DIAGNOSIS — S82852A Displaced trimalleolar fracture of left lower leg, initial encounter for closed fracture: Secondary | ICD-10-CM | POA: Insufficient documentation

## 2018-07-06 DIAGNOSIS — Z800 Family history of malignant neoplasm of digestive organs: Secondary | ICD-10-CM | POA: Insufficient documentation

## 2018-07-06 DIAGNOSIS — Z87891 Personal history of nicotine dependence: Secondary | ICD-10-CM | POA: Insufficient documentation

## 2018-07-06 DIAGNOSIS — I10 Essential (primary) hypertension: Secondary | ICD-10-CM | POA: Diagnosis not present

## 2018-07-06 HISTORY — PX: ORIF ANKLE FRACTURE: SHX5408

## 2018-07-06 LAB — SARS CORONAVIRUS 2 BY RT PCR (HOSPITAL ORDER, PERFORMED IN ~~LOC~~ HOSPITAL LAB): SARS Coronavirus 2: NEGATIVE

## 2018-07-06 SURGERY — OPEN REDUCTION INTERNAL FIXATION (ORIF) ANKLE FRACTURE
Anesthesia: Regional | Site: Ankle | Laterality: Left

## 2018-07-06 MED ORDER — FENTANYL CITRATE (PF) 100 MCG/2ML IJ SOLN
INTRAMUSCULAR | Status: AC
  Filled 2018-07-06: qty 2

## 2018-07-06 MED ORDER — PROPOFOL 500 MG/50ML IV EMUL
INTRAVENOUS | Status: DC | PRN
  Administered 2018-07-06: 25 ug/kg/min via INTRAVENOUS

## 2018-07-06 MED ORDER — PROPOFOL 10 MG/ML IV BOLUS
INTRAVENOUS | Status: DC | PRN
  Administered 2018-07-06: 150 mg via INTRAVENOUS

## 2018-07-06 MED ORDER — SODIUM CHLORIDE 0.9 % IV SOLN: INTRAVENOUS | Status: DC

## 2018-07-06 MED ORDER — DEXAMETHASONE SODIUM PHOSPHATE 10 MG/ML IJ SOLN
INTRAMUSCULAR | Status: DC | PRN
  Administered 2018-07-06: 10 mg via INTRAVENOUS

## 2018-07-06 MED ORDER — SENNA 8.6 MG PO TABS: 2.0000 | ORAL_TABLET | Freq: Two times a day (BID) | ORAL | 0 refills | Status: DC

## 2018-07-06 MED ORDER — PHENYLEPHRINE 40 MCG/ML (10ML) SYRINGE FOR IV PUSH (FOR BLOOD PRESSURE SUPPORT)
PREFILLED_SYRINGE | INTRAVENOUS | Status: AC
  Filled 2018-07-06: qty 30

## 2018-07-06 MED ORDER — FENTANYL CITRATE (PF) 100 MCG/2ML IJ SOLN
50.0000 ug | INTRAMUSCULAR | Status: DC | PRN
  Administered 2018-07-06: 11:00:00 50 ug via INTRAVENOUS
  Administered 2018-07-06: 25 ug via INTRAVENOUS

## 2018-07-06 MED ORDER — ROPIVACAINE HCL 5 MG/ML IJ SOLN
INTRAMUSCULAR | Status: DC | PRN
  Administered 2018-07-06: 10 mL via PERINEURAL
  Administered 2018-07-06: 30 mL via PERINEURAL

## 2018-07-06 MED ORDER — ONDANSETRON HCL 4 MG/2ML IJ SOLN
INTRAMUSCULAR | Status: AC
  Filled 2018-07-06: qty 16

## 2018-07-06 MED ORDER — DEXAMETHASONE SODIUM PHOSPHATE 4 MG/ML IJ SOLN: 4.0000 mg | INTRAMUSCULAR | Status: DC

## 2018-07-06 MED ORDER — PROPOFOL 500 MG/50ML IV EMUL
INTRAVENOUS | Status: AC
  Filled 2018-07-06: qty 100

## 2018-07-06 MED ORDER — LIDOCAINE 2% (20 MG/ML) 5 ML SYRINGE
INTRAMUSCULAR | Status: AC
  Filled 2018-07-06: qty 20

## 2018-07-06 MED ORDER — DEXAMETHASONE SODIUM PHOSPHATE 10 MG/ML IJ SOLN
INTRAMUSCULAR | Status: AC
  Filled 2018-07-06: qty 4

## 2018-07-06 MED ORDER — LIDOCAINE HCL (CARDIAC) PF 100 MG/5ML IV SOSY
PREFILLED_SYRINGE | INTRAVENOUS | Status: DC | PRN
  Administered 2018-07-06: 30 mg via INTRAVENOUS

## 2018-07-06 MED ORDER — EPHEDRINE 5 MG/ML INJ
INTRAVENOUS | Status: AC
  Filled 2018-07-06: qty 20

## 2018-07-06 MED ORDER — LACTATED RINGERS IV SOLN
INTRAVENOUS | Status: DC
  Administered 2018-07-06 (×2): via INTRAVENOUS

## 2018-07-06 MED ORDER — OXYCODONE HCL 5 MG PO TABS: 5.0000 mg | ORAL_TABLET | ORAL | 0 refills | Status: AC | PRN

## 2018-07-06 MED ORDER — DOCUSATE SODIUM 100 MG PO CAPS: 100.0000 mg | ORAL_CAPSULE | Freq: Two times a day (BID) | ORAL | 0 refills | Status: DC

## 2018-07-06 MED ORDER — MIDAZOLAM HCL 2 MG/2ML IJ SOLN
1.0000 mg | INTRAMUSCULAR | Status: DC | PRN
  Administered 2018-07-06: 11:00:00 1 mg via INTRAVENOUS

## 2018-07-06 MED ORDER — CHLORHEXIDINE GLUCONATE 4 % EX LIQD: 60.0000 mL | Freq: Once | CUTANEOUS | Status: DC

## 2018-07-06 MED ORDER — ASPIRIN EC 81 MG PO TBEC: 81.0000 mg | DELAYED_RELEASE_TABLET | Freq: Two times a day (BID) | ORAL | 0 refills | Status: DC

## 2018-07-06 MED ORDER — CEFAZOLIN SODIUM-DEXTROSE 2-4 GM/100ML-% IV SOLN
2.0000 g | INTRAVENOUS | Status: AC
  Administered 2018-07-06: 2 g via INTRAVENOUS

## 2018-07-06 MED ORDER — CEFAZOLIN SODIUM-DEXTROSE 2-4 GM/100ML-% IV SOLN
INTRAVENOUS | Status: AC
  Filled 2018-07-06: qty 100

## 2018-07-06 MED ORDER — MIDAZOLAM HCL 2 MG/2ML IJ SOLN
INTRAMUSCULAR | Status: AC
  Filled 2018-07-06: qty 2

## 2018-07-06 MED ORDER — ONDANSETRON HCL 4 MG/2ML IJ SOLN
INTRAMUSCULAR | Status: DC | PRN
  Administered 2018-07-06: 4 mg via INTRAVENOUS

## 2018-07-06 MED ORDER — EPHEDRINE SULFATE 50 MG/ML IJ SOLN
INTRAMUSCULAR | Status: DC | PRN
  Administered 2018-07-06 (×3): 10 mg via INTRAVENOUS

## 2018-07-06 SURGICAL SUPPLY — 82 items
BANDAGE ESMARK 6X9 LF (GAUZE/BANDAGES/DRESSINGS) ×1 IMPLANT
BIT DRILL 2.5X2.75 QC CALB (BIT) ×2 IMPLANT
BIT DRILL 2.9 CANN QC NONSTRL (BIT) ×2 IMPLANT
BIT DRILL 3.5X5.5 QC CALB (BIT) ×2 IMPLANT
BLADE SURG 15 STRL LF DISP TIS (BLADE) ×2 IMPLANT
BLADE SURG 15 STRL SS (BLADE) ×2
BNDG COHESIVE 4X5 TAN STRL (GAUZE/BANDAGES/DRESSINGS) ×2 IMPLANT
BNDG COHESIVE 6X5 TAN STRL LF (GAUZE/BANDAGES/DRESSINGS) ×2 IMPLANT
BNDG ESMARK 4X9 LF (GAUZE/BANDAGES/DRESSINGS) IMPLANT
BNDG ESMARK 6X9 LF (GAUZE/BANDAGES/DRESSINGS) ×2
CANISTER SUCT 1200ML W/VALVE (MISCELLANEOUS) ×2 IMPLANT
CHLORAPREP W/TINT 26 (MISCELLANEOUS) ×2 IMPLANT
COVER BACK TABLE REUSABLE LG (DRAPES) ×2 IMPLANT
COVER WAND RF STERILE (DRAPES) IMPLANT
CUFF TOURN SGL QUICK 34 (TOURNIQUET CUFF)
CUFF TRNQT CYL 34X4.125X (TOURNIQUET CUFF) IMPLANT
DECANTER SPIKE VIAL GLASS SM (MISCELLANEOUS) IMPLANT
DRAPE EXTREMITY T 121X128X90 (DISPOSABLE) ×2 IMPLANT
DRAPE OEC MINIVIEW 54X84 (DRAPES) ×2 IMPLANT
DRAPE U-SHAPE 47X51 STRL (DRAPES) ×2 IMPLANT
DRSG MEPITEL 4X7.2 (GAUZE/BANDAGES/DRESSINGS) ×2 IMPLANT
DRSG PAD ABDOMINAL 8X10 ST (GAUZE/BANDAGES/DRESSINGS) ×4 IMPLANT
ELECT REM PT RETURN 9FT ADLT (ELECTROSURGICAL) ×2
ELECTRODE REM PT RTRN 9FT ADLT (ELECTROSURGICAL) ×1 IMPLANT
GAUZE SPONGE 4X4 12PLY STRL (GAUZE/BANDAGES/DRESSINGS) ×2 IMPLANT
GLOVE BIO SURGEON STRL SZ8 (GLOVE) ×2 IMPLANT
GLOVE BIOGEL PI IND STRL 6.5 (GLOVE) ×2 IMPLANT
GLOVE BIOGEL PI IND STRL 7.0 (GLOVE) ×1 IMPLANT
GLOVE BIOGEL PI IND STRL 8 (GLOVE) ×2 IMPLANT
GLOVE BIOGEL PI INDICATOR 6.5 (GLOVE) ×2
GLOVE BIOGEL PI INDICATOR 7.0 (GLOVE) ×1
GLOVE BIOGEL PI INDICATOR 8 (GLOVE) ×2
GLOVE ECLIPSE 6.5 STRL STRAW (GLOVE) ×2 IMPLANT
GLOVE ECLIPSE 8.0 STRL XLNG CF (GLOVE) ×2 IMPLANT
GLOVE SURG SS PI 7.0 STRL IVOR (GLOVE) ×2 IMPLANT
GOWN STRL REUS W/ TWL LRG LVL3 (GOWN DISPOSABLE) ×1 IMPLANT
GOWN STRL REUS W/ TWL XL LVL3 (GOWN DISPOSABLE) ×4 IMPLANT
GOWN STRL REUS W/TWL LRG LVL3 (GOWN DISPOSABLE) ×1
GOWN STRL REUS W/TWL XL LVL3 (GOWN DISPOSABLE) ×4
K-WIRE ACE 1.6X6 (WIRE) ×6
KWIRE ACE 1.6X6 (WIRE) ×3 IMPLANT
NEEDLE HYPO 22GX1.5 SAFETY (NEEDLE) IMPLANT
NS IRRIG 1000ML POUR BTL (IV SOLUTION) ×2 IMPLANT
PACK BASIN DAY SURGERY FS (CUSTOM PROCEDURE TRAY) ×2 IMPLANT
PAD CAST 4YDX4 CTTN HI CHSV (CAST SUPPLIES) ×1 IMPLANT
PADDING CAST ABS 4INX4YD NS (CAST SUPPLIES)
PADDING CAST ABS COTTON 4X4 ST (CAST SUPPLIES) IMPLANT
PADDING CAST COTTON 4X4 STRL (CAST SUPPLIES) ×1
PADDING CAST COTTON 6X4 STRL (CAST SUPPLIES) ×2 IMPLANT
PENCIL BUTTON HOLSTER BLD 10FT (ELECTRODE) ×2 IMPLANT
PLATE ACE 100DEG 6HOLE (Plate) ×2 IMPLANT
SANITIZER HAND PURELL 535ML FO (MISCELLANEOUS) ×2 IMPLANT
SCREW ACE CAN 4.0 38M (Screw) ×2 IMPLANT
SCREW ACE CAN 4.0 40M (Screw) ×4 IMPLANT
SCREW ACE CAN 4.0 42M (Screw) ×2 IMPLANT
SCREW CORTICAL 3.5MM  16MM (Screw) ×1 IMPLANT
SCREW CORTICAL 3.5MM  28MM (Screw) ×1 IMPLANT
SCREW CORTICAL 3.5MM 14MM (Screw) ×6 IMPLANT
SCREW CORTICAL 3.5MM 16MM (Screw) ×1 IMPLANT
SCREW CORTICAL 3.5MM 18MM (Screw) ×2 IMPLANT
SCREW CORTICAL 3.5MM 28MM (Screw) ×1 IMPLANT
SHEET MEDIUM DRAPE 40X70 STRL (DRAPES) ×2 IMPLANT
SLEEVE SCD COMPRESS KNEE MED (MISCELLANEOUS) ×2 IMPLANT
SPLINT FAST PLASTER 5X30 (CAST SUPPLIES) ×20
SPLINT PLASTER CAST FAST 5X30 (CAST SUPPLIES) ×20 IMPLANT
SPONGE LAP 18X18 RF (DISPOSABLE) ×2 IMPLANT
STOCKINETTE 6  STRL (DRAPES) ×1
STOCKINETTE 6 STRL (DRAPES) ×1 IMPLANT
SUCTION FRAZIER HANDLE 10FR (MISCELLANEOUS) ×1
SUCTION TUBE FRAZIER 10FR DISP (MISCELLANEOUS) ×1 IMPLANT
SUT ETHILON 3 0 PS 1 (SUTURE) ×4 IMPLANT
SUT FIBERWIRE #2 38 T-5 BLUE (SUTURE)
SUT MNCRL AB 3-0 PS2 18 (SUTURE) ×2 IMPLANT
SUT VIC AB 0 SH 27 (SUTURE) IMPLANT
SUT VIC AB 2-0 SH 27 (SUTURE) ×1
SUT VIC AB 2-0 SH 27XBRD (SUTURE) ×1 IMPLANT
SUTURE FIBERWR #2 38 T-5 BLUE (SUTURE) IMPLANT
SYR BULB 3OZ (MISCELLANEOUS) ×2 IMPLANT
SYR CONTROL 10ML LL (SYRINGE) IMPLANT
TOWEL GREEN STERILE FF (TOWEL DISPOSABLE) ×4 IMPLANT
TUBE CONNECTING 20X1/4 (TUBING) ×2 IMPLANT
UNDERPAD 30X30 (UNDERPADS AND DIAPERS) ×2 IMPLANT

## 2018-07-06 NOTE — Anesthesia Procedure Notes (Signed)
Procedure Name: LMA Insertion Date/Time: 07/06/2018 11:36 AM Performed by: Signe Colt, CRNA Pre-anesthesia Checklist: Patient identified, Emergency Drugs available, Suction available and Patient being monitored Patient Re-evaluated:Patient Re-evaluated prior to induction Oxygen Delivery Method: Circle system utilized Preoxygenation: Pre-oxygenation with 100% oxygen Induction Type: IV induction Ventilation: Mask ventilation without difficulty LMA: LMA inserted LMA Size: 4.0 Number of attempts: 1 Airway Equipment and Method: Bite block Placement Confirmation: positive ETCO2 Tube secured with: Tape Dental Injury: Teeth and Oropharynx as per pre-operative assessment

## 2018-07-06 NOTE — Discharge Instructions (Addendum)
Wylene Simmer, MD [facility]  Please read the following information regarding your care after surgery.  Medications  You only need a prescription for the narcotic pain medicine (ex. oxycodone, Percocet, Norco).  All of the other medicines listed below are available over the counter. X Aleve 2 pills twice a day for the first 3 days after surgery. X acetominophen (Tylenol) 650 mg every 4-6 hours as you need for minor to moderate pain X oxycodone as prescribed for severe pain  Narcotic pain medicine (ex. oxycodone, Percocet, Vicodin) will cause constipation.  To prevent this problem, take the following medicines while you are taking any pain medicine. X docusate sodium (Colace) 100 mg twice a day X senna (Senokot) 2 tablets twice a day  X To help prevent blood clots, take a baby aspirin (81 mg) twice a day after surgery.  You should also get up every hour while you are awake to move around.    Weight Bearing X Do not bear any weight on the operated leg or foot.  Cast / Splint / Dressing X Keep your splint, cast or dressing clean and dry.  Dont put anything (coat hanger, pencil, etc) down inside of it.  If it gets damp, use a hair dryer on the cool setting to dry it.  If it gets soaked, call the office to schedule an appointment for a cast change.   After your dressing, cast or splint is removed; you may shower, but do not soak or scrub the wound.  Allow the water to run over it, and then gently pat it dry.  Swelling It is normal for you to have swelling where you had surgery.  To reduce swelling and pain, keep your toes above your nose for at least 3 days after surgery.  It may be necessary to keep your foot or leg elevated for several weeks.  If it hurts, it should be elevated.  Follow Up Call my office at [phone] when you are discharged from the hospital or surgery center to schedule an appointment to be seen two weeks after surgery.  Call my office at [phone] if you develop a  fever >101.5 F, nausea, vomiting, bleeding from the surgical site or severe pain.      Post Anesthesia Home Care Instructions  Activity: Get plenty of rest for the remainder of the day. A responsible individual must stay with you for 24 hours following the procedure.  For the next 24 hours, DO NOT: -Drive a car -Paediatric nurse -Drink alcoholic beverages -Take any medication unless instructed by your physician -Make any legal decisions or sign important papers.  Meals: Start with liquid foods such as gelatin or soup. Progress to regular foods as tolerated. Avoid greasy, spicy, heavy foods. If nausea and/or vomiting occur, drink only clear liquids until the nausea and/or vomiting subsides. Call your physician if vomiting continues.  Special Instructions/Symptoms: Your throat may feel dry or sore from the anesthesia or the breathing tube placed in your throat during surgery. If this causes discomfort, gargle with warm salt water. The discomfort should disappear within 24 hours.  If you had a scopolamine patch placed behind your ear for the management of post- operative nausea and/or vomiting:  1. The medication in the patch is effective for 72 hours, after which it should be removed.  Wrap patch in a tissue and discard in the trash. Wash hands thoroughly with soap and water. 2. You may remove the patch earlier than 72 hours if you experience unpleasant side  effects which may include dry mouth, dizziness or visual disturbances. 3. Avoid touching the patch. Wash your hands with soap and water after contact with the patch.    Regional Anesthesia Blocks  1. Numbness or the inability to move the "blocked" extremity may last from 3-48 hours after placement. The length of time depends on the medication injected and your individual response to the medication. If the numbness is not going away after 48 hours, call your surgeon.  2. The extremity that is blocked will need to be protected until  the numbness is gone and the  Strength has returned. Because you cannot feel it, you will need to take extra care to avoid injury. Because it may be weak, you may have difficulty moving it or using it. You may not know what position it is in without looking at it while the block is in effect.  3. For blocks in the legs and feet, returning to weight bearing and walking needs to be done carefully. You will need to wait until the numbness is entirely gone and the strength has returned. You should be able to move your leg and foot normally before you try and bear weight or walk. You will need someone to be with you when you first try to ensure you do not fall and possibly risk injury.  4. Bruising and tenderness at the needle site are common side effects and will resolve in a few days.  5. Persistent numbness or new problems with movement should be communicated to the surgeon or the [facility] [phone] [place] [phone]).  Call your surgeon if you experience:   1.  Fever over 101.0. 2.  Inability to urinate. 3.  Nausea and/or vomiting. 4.  Extreme swelling or bruising at the surgical site. 5.  Continued bleeding from the incision. 6.  Increased pain, redness or drainage from the incision. 7.  Problems related to your pain medication. 8.  Any problems and/or concerns

## 2018-07-06 NOTE — Transfer of Care (Signed)
Immediate Anesthesia Transfer of Care Note  Patient: Brittany Summers  Procedure(s) Performed: OPEN REDUCTION INTERNAL FIXATION (ORIF) left ankle trimalleolar fracture (Left Ankle)  Patient Location: PACU  Anesthesia Type:GA combined with regional for post-op pain  Level of Consciousness: awake, sedated and patient cooperative  Airway & Oxygen Therapy: Patient Spontanous Breathing and Patient connected to nasal cannula oxygen  Post-op Assessment: Report given to RN and Post -op Vital signs reviewed and stable  Post vital signs: Reviewed and stable  Last Vitals:  Vitals Value Taken Time  BP 118/59 07/06/18 1300  Temp    Pulse 92 07/06/18 1301  Resp 22 07/06/18 1301  SpO2 100 % 07/06/18 1301  Vitals shown include unvalidated device data.  Last Pain:  Vitals:   07/06/18 1059  TempSrc: Oral  PainSc: 0-No pain      Patients Stated Pain Goal: 0 (09/40/76 8088)  Complications: No apparent anesthesia complications

## 2018-07-06 NOTE — Anesthesia Preprocedure Evaluation (Signed)
Anesthesia Evaluation  Patient identified by MRN, date of birth, ID band Patient awake    Reviewed: Allergy & Precautions, NPO status , Patient's Chart, lab work & pertinent test results  Airway Mallampati: II  TM Distance: >3 FB Neck ROM: Full    Dental no notable dental hx. (+) Teeth Intact   Pulmonary former smoker,    Pulmonary exam normal breath sounds clear to auscultation       Cardiovascular hypertension, Pt. on medications Normal cardiovascular exam Rhythm:Regular Rate:Normal     Neuro/Psych negative neurological ROS  negative psych ROS   GI/Hepatic negative GI ROS,   Endo/Other  negative endocrine ROS  Renal/GU negative Renal ROS     Musculoskeletal negative musculoskeletal ROS (+)   Abdominal   Peds  Hematology negative hematology ROS (+)   Anesthesia Other Findings   Reproductive/Obstetrics                             Anesthesia Physical Anesthesia Plan  ASA: II  Anesthesia Plan: General   Post-op Pain Management:  Regional for Post-op pain   Induction:   PONV Risk Score and Plan: 4 or greater and Ondansetron and Dexamethasone  Airway Management Planned: LMA  Additional Equipment:   Intra-op Plan:   Post-operative Plan:   Informed Consent: I have reviewed the patients History and Physical, chart, labs and discussed the procedure including the risks, benefits and alternatives for the proposed anesthesia with the patient or authorized representative who has indicated his/her understanding and acceptance.     Dental advisory given  Plan Discussed with: CRNA  Anesthesia Plan Comments: (GA w Adductor canal and popliteal blocks)        Anesthesia Quick Evaluation

## 2018-07-06 NOTE — Anesthesia Procedure Notes (Signed)
Anesthesia Regional Block: Popliteal block   Pre-Anesthetic Checklist: ,, timeout performed, Correct Patient, Correct Site, Correct Laterality, Correct Procedure, Correct Position, site marked, Risks and benefits discussed, pre-op evaluation,  At surgeon's request and post-op pain management  Laterality: Left  Prep: Maximum Sterile Barrier Precautions used, chloraprep       Needles:  Injection technique: Single-shot  Needle Type: Echogenic Needle     Needle Length: 9cm  Needle Gauge: 21     Additional Needles:   Procedures:,,,, ultrasound used (permanent image in chart),,,,  Narrative:  Start time: 07/06/2018 11:08 AM End time: 07/06/2018 11:16 AM Injection made incrementally with aspirations every 5 mL.  Performed by: Personally  Anesthesiologist: Barnet Glasgow, MD  Additional Notes: Block assessed. Patient tolerated procedure well.

## 2018-07-06 NOTE — Op Note (Signed)
07/06/2018  1:16 PM  PATIENT:  Brittany Summers  63 y.o. female  PRE-OPERATIVE DIAGNOSIS:  left ankle trimalleolar fracture  POST-OPERATIVE DIAGNOSIS:  left ankle trimalleolar fracture  Procedure(s): 1.  Open treatment of left ankle trimalleolar fracture with internal fixation including fixation of the posterior lip 2.  AP, lateral and mortise radiographs of the left ankle  SURGEON:  Wylene Simmer, MD  ASSISTANT: Mechele Claude, PA-C  ANESTHESIA:   General, regional  EBL:  minimal   TOURNIQUET:   Total Tourniquet Time Documented: Thigh (Left) - 60 minutes Total: Thigh (Left) - 60 minutes  COMPLICATIONS:  None apparent  DISPOSITION:  Extubated, awake and stable to recovery.  INDICATION FOR PROCEDURE: The patient is a 63 year old female who slipped walking down her driveway this past Sunday injuring her left ankle.  Radiographs in the emergency department revealed a displaced left ankle trimalleolar fracture.  She presents now for operative treatment of this displaced and unstable left ankle fracture.  The risks and benefits of the alternative treatment options have been discussed in detail.  The patient wishes to proceed with surgery and specifically understands risks of bleeding, infection, nerve damage, blood clots, need for additional surgery, amputation and death.  PROCEDURE IN DETAIL:  After pre operative consent was obtained, and the correct operative site was identified, the patient was brought to the operating room and placed supine on the OR table.  Anesthesia was administered.  Pre-operative antibiotics were administered.  A surgical timeout was taken.  The left lower extremity was then prepped and draped in standard sterile fashion.  The extremity was elevated and a thigh tourniquet was inflated to 250 mmHg.  A longitudinal incision was then made over the lateral malleolus.  Dissection was carried down to the subcutaneous tissues.  The fracture site was identified.  It was  cleaned of all hematoma and irrigated copiously.  The fracture site was reduced and held with a tenaculum.  A 3.5 mm fully threaded lag screw was inserted from posterior to anterior across the fracture site.  A 6 hole one third tubular plate was then contoured to fit the lateral malleolus.  It was secured proximally with 3 bicortical screws and distally with 2 unicortical screws.  Attention was then turned to the medial aspect of the ankle where a longitudinal incision was made.  Dissection was carried down through the subcutaneous tissues just posterior to an area of fracture blister.  The medial malleolus fracture was identified.  It was cleaned of all hematoma and periosteum.  The posterior malleolus fracture was also identified through this incision.  It was cleaned of all hematoma and reduced with a joker elevator.  K wires were inserted from anterior to posterior across the fracture site.  These held the 2 fracture fragments in appropriate position.  A small incision was made anteriorly.  Both K wires were overdrilled and 4 mm partially-threaded cannulated screws were inserted.  These were noted to have adequate purchase at the level of the fracture site.  The medial malleolus fracture fragment was then reduced and provisionally pinned.  AP and lateral radiographs confirmed appropriate reduction of the fracture site.  The fracture was then compressed with 4 mm partially-threaded cannulated screws.  Both were noted to have adequate purchase.  K wires were removed.  Final AP, mortise and lateral radiographs confirmed appropriate reduction of all 3 fractures in appropriate position and length of all hardware.  Both wounds were then irrigated copiously.  Deep subcutaneous tissues were approximated with  2-0 Vicryl.  Superficial subcutaneous tissues were approximated with Monocryl.  Skin incisions were closed with nylon.  Sterile dressings were applied followed by a well-padded short leg splint.  Tourniquet  was released after application of the dressings.  The patient was awakened from anesthesia and transported to the recovery room in stable condition.   FOLLOW UP PLAN: Nonweightbearing on the left lower extremity in a short leg splint.  Aspirin 81 mg p.o. twice daily for DVT prophylaxis.  Follow-up with me in the office in 2 weeks for suture removal and conversion to a short leg cast.  Plan 6 weeks of postop nonweightbearing immobilization.   RADIOGRAPHS: AP, mortise and lateral radiographs of the left ankle are obtained intraoperatively.  These show interval reduction and fixation of medial, lateral and posterior malleolus fractures.  Hardware is appropriately positioned and of the appropriate lengths.    Mechele Claude PA-C was present and scrubbed for the duration of the operative case. His assistance was essential in positioning the patient, prepping and draping, gaining and maintaining exposure, performing the operation, closing and dressing the wounds and applying the splint.

## 2018-07-06 NOTE — Anesthesia Procedure Notes (Signed)
Anesthesia Regional Block: Adductor canal block   Pre-Anesthetic Checklist: ,, timeout performed, Correct Patient, Correct Site, Correct Laterality, Correct Procedure, Correct Position, site marked, Risks and benefits discussed,  Surgical consent,  Pre-op evaluation,  At surgeon's request and post-op pain management  Laterality: Lower and Left  Prep: chloraprep       Needles:  Injection technique: Single-shot  Needle Type: Echogenic Needle     Needle Length: 9cm  Needle Gauge: 22     Additional Needles:   Procedures:,,,, ultrasound used (permanent image in chart),,,,  Narrative:  Start time: 07/06/2018 11:24 AM End time: 07/06/2018 11:30 AM Injection made incrementally with aspirations every 5 mL.  Performed by: Personally  Anesthesiologist: Barnet Glasgow, MD  Additional Notes: Block assessed prior to surgery. Pt tolerated procedure well.

## 2018-07-06 NOTE — H&P (Signed)
Brittany Summers is an 63 y.o. female.   Chief Complaint: Left ankle pain HPI: The patient is a 63 year old female without significant past medical history.  She was walking down her driveway this past Sunday when she slipped and sprained her right ankle.  She then twisted her left ankle.  The patient was unable to bear weight after this injury and was taken to the hospital where x-rays revealed a trimalleolar left ankle fracture.  She presents now for operative treatment of this displaced and unstable left ankle fracture.  Past Medical History:  Diagnosis Date  . Chronic hepatitis C without hepatic coma ([place]) 09/22/2016  . Gallstone    known with h/o cholecystitis pain, no recent recurrence  . Hypertension     Past Surgical History:  Procedure Laterality Date  . BREAST BIOPSY Left 2017  . MAXILLARY SINUS LIFT    . MOUTH SURGERY    . TONSILLECTOMY     age 67    Family History  Problem Relation Age of Onset  . Hypertension Mother   . Breast cancer Mother        Dx in her early 74's  . Hypertension Father   . Diabetes Father   . Leukemia Father   . Breast cancer Maternal Grandmother   . Stomach cancer Maternal Grandmother 90  . Colon cancer Maternal Uncle   . Colon polyps Neg Hx   . Rectal cancer Neg Hx   . Esophageal cancer Neg Hx    Social History:  reports that she quit smoking about 2 years ago. Her smoking use included cigarettes. She has never used smokeless tobacco. She reports that she does not drink alcohol or use drugs.  Allergies:  Allergies  Allergen Reactions  . Penicillins     Has patient had a PCN reaction causing immediate rash, facial/tongue/throat swelling, SOB or lightheadedness with hypotension: Y Has patient had a PCN reaction causing severe rash involving mucus membranes or skin necrosis: Y Has patient had a PCN reaction that required hospitalization: N Has patient had a PCN reaction occurring within the last 10 years: Y If all of the above answers are  "NO", then may proceed with Cephalosporin use.     No medications prior to admission.    No results found for this or any previous visit (from the past 48 hour(s)). No results found.  ROS no recent fever, chills, nausea, vomiting or changes in her appetite There were no vitals taken for this visit. Physical Exam  Well-nourished well-developed woman in no apparent distress.  Alert and oriented x4.  Mood and affect are normal.  Extraocular motions are intact.  Respirations are unlabored.  Gait is nonweightbearing on the left lower extremity.  The left ankle is splinted.  Wrist capillary refill toes.  Intact sensibility to light touch dorsally and plantarly at the forefoot.  No lymphadenopathy of the knee.  X-rays: 3 views nonweightbearing of the left ankle as well as a CT are reviewed.  These show a comminuted and displaced left ankle trimalleolar fracture  Assessment/Plan Left ankle trimalar fracture-closed and displaced- to the operating room today for open treatment with internal fixation.  The risks and benefits of the alternative treatment options have been discussed in detail.  The patient wishes to proceed with surgery and specifically understands risks of bleeding, infection, nerve damage, blood clots, need for additional surgery, amputation and death.   Wylene Simmer, MD 07/15/18, 9:56 AM

## 2018-07-06 NOTE — Anesthesia Postprocedure Evaluation (Signed)
Anesthesia Post Note  Patient: Brittany Summers  Procedure(s) Performed: OPEN REDUCTION INTERNAL FIXATION (ORIF) left ankle trimalleolar fracture (Left Ankle)     Patient location during evaluation: PACU Anesthesia Type: General and Regional Level of consciousness: awake and alert Pain management: pain level controlled Vital Signs Assessment: post-procedure vital signs reviewed and stable Respiratory status: spontaneous breathing, nonlabored ventilation, respiratory function stable and patient connected to nasal cannula oxygen Cardiovascular status: blood pressure returned to baseline and stable Postop Assessment: no apparent nausea or vomiting Anesthetic complications: no    Last Vitals:  Vitals:   07/06/18 1315 07/06/18 1345  BP: 113/61 109/62  Pulse: 86 85  Resp: 16 18  Temp:  37.1 C  SpO2: 100% 98%    Last Pain:  Vitals:   07/06/18 1345  TempSrc:   PainSc: 0-No pain                 Chelsey L Woodrum

## 2018-07-06 NOTE — Progress Notes (Signed)
AssistedDr. Houser with left, ultrasound guided, popliteal, adductor canal block. Side rails up, monitors on throughout procedure. See vital signs in flow sheet. Tolerated Procedure well.  

## 2018-07-10 ENCOUNTER — Encounter (HOSPITAL_BASED_OUTPATIENT_CLINIC_OR_DEPARTMENT_OTHER): Payer: Self-pay | Admitting: Orthopedic Surgery

## 2018-08-03 ENCOUNTER — Encounter: Payer: Self-pay | Admitting: Family Medicine

## 2018-08-08 ENCOUNTER — Inpatient Hospital Stay: Admission: RE | Admit: 2018-08-08 | Payer: BLUE CROSS/BLUE SHIELD | Source: Ambulatory Visit

## 2018-09-05 ENCOUNTER — Ambulatory Visit (INDEPENDENT_AMBULATORY_CARE_PROVIDER_SITE_OTHER)
Admission: RE | Admit: 2018-09-05 | Discharge: 2018-09-05 | Disposition: A | Discharge status: Home / Self Care | Payer: Medicare Other | Source: Ambulatory Visit | Attending: Acute Care | Admitting: Acute Care

## 2018-09-05 ENCOUNTER — Other Ambulatory Visit: Payer: Self-pay

## 2018-09-05 DIAGNOSIS — Z87891 Personal history of nicotine dependence: Secondary | ICD-10-CM | POA: Diagnosis not present

## 2018-09-05 DIAGNOSIS — Z122 Encounter for screening for malignant neoplasm of respiratory organs: Secondary | ICD-10-CM

## 2018-09-08 ENCOUNTER — Other Ambulatory Visit: Payer: Self-pay | Admitting: *Deleted

## 2018-09-08 DIAGNOSIS — Z87891 Personal history of nicotine dependence: Secondary | ICD-10-CM

## 2018-09-08 DIAGNOSIS — Z122 Encounter for screening for malignant neoplasm of respiratory organs: Secondary | ICD-10-CM

## 2019-02-14 ENCOUNTER — Ambulatory Visit: Payer: Medicare Other

## 2019-02-24 ENCOUNTER — Ambulatory Visit: Payer: Medicare Other | Attending: Internal Medicine

## 2019-02-24 DIAGNOSIS — Z23 Encounter for immunization: Secondary | ICD-10-CM | POA: Insufficient documentation

## 2019-02-24 NOTE — Progress Notes (Signed)
   Covid-19 Vaccination Clinic  Name:  Brittany Summers    MRN: [id number] DOB: 18-Feb-1955  02/24/2019  [name] was observed post Covid-19 immunization for 15 minutes without incidence. She was provided with Vaccine Information Sheet and instruction to access the V-Safe system.   Ms. Kidney was instructed to call 911 with any severe reactions post vaccine: Marland Kitchen Difficulty breathing  . Swelling of your face and throat  . A fast heartbeat  . A bad rash all over your body  . Dizziness and weakness    Immunizations Administered    Name Date Dose VIS Date Route   Pfizer COVID-19 Vaccine 02/24/2019  9:18 AM 0.3 mL 12/29/2018 Intramuscular   Manufacturer: [place]   Lot: [id number]   [place]: [id number]

## 2019-02-25 ENCOUNTER — Ambulatory Visit: Payer: Medicare Other

## 2019-03-20 ENCOUNTER — Ambulatory Visit: Payer: Medicare Other | Attending: Internal Medicine

## 2019-03-20 DIAGNOSIS — Z23 Encounter for immunization: Secondary | ICD-10-CM | POA: Insufficient documentation

## 2019-03-20 NOTE — Progress Notes (Signed)
   Covid-19 Vaccination Clinic  Name:  Brittany Summers    MRN: [id number] DOB: [birth year]  03/20/2019  [name] was observed post Covid-19 immunization for 15 minutes without incident. She was provided with Vaccine Information Sheet and instruction to access the V-Safe system.   [name] was instructed to call 911 with any severe reactions post vaccine: Marland Kitchen Difficulty breathing  . Swelling of face and throat  . A fast heartbeat  . A bad rash all over body  . Dizziness and weakness   Immunizations Administered    Name Date Dose VIS Date Route   Pfizer COVID-19 Vaccine 03/20/2019 10:29 AM 0.3 mL 12/29/2018 Intramuscular   Manufacturer: [place]   Lot: [id number]   [place]: [id number]

## 2019-09-26 ENCOUNTER — Other Ambulatory Visit: Payer: Self-pay

## 2019-09-26 ENCOUNTER — Ambulatory Visit (INDEPENDENT_AMBULATORY_CARE_PROVIDER_SITE_OTHER): Payer: Medicare Other | Admitting: Registered Nurse

## 2019-09-26 ENCOUNTER — Encounter: Payer: Self-pay | Admitting: Registered Nurse

## 2019-09-26 VITALS — BP 128/84 | HR 89 | Temp 98.0°F | Ht 61.0 in | Wt 144.0 lb

## 2019-09-26 DIAGNOSIS — H538 Other visual disturbances: Secondary | ICD-10-CM

## 2019-09-26 DIAGNOSIS — Z13220 Encounter for screening for lipoid disorders: Secondary | ICD-10-CM

## 2019-09-26 DIAGNOSIS — Z1231 Encounter for screening mammogram for malignant neoplasm of breast: Secondary | ICD-10-CM

## 2019-09-26 DIAGNOSIS — Z1211 Encounter for screening for malignant neoplasm of colon: Secondary | ICD-10-CM

## 2019-09-26 DIAGNOSIS — Z0000 Encounter for general adult medical examination without abnormal findings: Secondary | ICD-10-CM

## 2019-09-26 DIAGNOSIS — Z0001 Encounter for general adult medical examination with abnormal findings: Secondary | ICD-10-CM | POA: Diagnosis not present

## 2019-09-26 DIAGNOSIS — I10 Essential (primary) hypertension: Secondary | ICD-10-CM

## 2019-09-26 LAB — LIPID PANEL
Chol/HDL Ratio: 2.5 ratio (ref 0.0–4.4)
Cholesterol, Total: 162 mg/dL (ref 100–199)
HDL: 64 mg/dL (ref >39)
LDL Chol Calc (NIH): 86 mg/dL (ref 0–99)
Triglycerides: 58 mg/dL (ref 0–149)
VLDL Cholesterol Cal: 12 mg/dL (ref 5–40)

## 2019-09-26 LAB — CBC
Hematocrit: 40.6 % (ref 34.0–46.6)
Hemoglobin: 13.5 g/dL (ref 11.1–15.9)
MCH: 29.8 pg (ref 26.6–33.0)
MCHC: 33.3 g/dL (ref 31.5–35.7)
MCV: 90 fL (ref 79–97)
Platelets: 311 10*3/uL (ref 150–450)
RBC: 4.53 x10E6/uL (ref 3.77–5.28)
RDW: 12.2 % (ref 11.7–15.4)
WBC: 7.3 10*3/uL (ref 3.4–10.8)

## 2019-09-26 LAB — CMP14+EGFR
ALT: 13 IU/L (ref 0–32)
AST: 13 IU/L (ref 0–40)
Albumin/Globulin Ratio: 2 (ref 1.2–2.2)
Albumin: 4.7 g/dL (ref 3.8–4.8)
Alkaline Phosphatase: 63 IU/L (ref 48–121)
BUN/Creatinine Ratio: 16 (ref 12–28)
BUN: 9 mg/dL (ref 8–27)
Bilirubin Total: 0.5 mg/dL (ref 0.0–1.2)
CO2: 25 mmol/L (ref 20–29)
Calcium: 9.6 mg/dL (ref 8.7–10.3)
Chloride: 103 mmol/L (ref 96–106)
Creatinine, Ser: 0.55 mg/dL — ABNORMAL LOW (ref 0.57–1.00)
GFR calc Af Amer: 113 mL/min/{1.73_m2} (ref >59)
GFR calc non Af Amer: 98 mL/min/{1.73_m2} (ref >59)
Globulin, Total: 2.4 g/dL (ref 1.5–4.5)
Glucose: 98 mg/dL (ref 65–99)
Potassium: 4.8 mmol/L (ref 3.5–5.2)
Sodium: 141 mmol/L (ref 134–144)
Total Protein: 7.1 g/dL (ref 6.0–8.5)

## 2019-09-26 NOTE — Patient Instructions (Signed)
° ° ° °  If you have lab work done today you will be contacted with your lab results within the next 2 weeks.  If you have not heard from us then please contact us. The fastest way to get your results is to register for My Chart. ° ° °IF you received an x-ray today, you will receive an invoice from [facility]. Please contact  Radiology at [phone] with questions or concerns regarding your invoice.  ° °IF you received labwork today, you will receive an invoice from [facility]. Please contact [facility] at [phone] with questions or concerns regarding your invoice.  ° °Our billing staff will not be able to assist you with questions regarding bills from these companies. ° °You will be contacted with the lab results as soon as they are available. The fastest way to get your results is to activate your My Chart account. Instructions are located on the last page of this paperwork. If you have not heard from us regarding the results in 2 weeks, please contact this office. °  ° ° ° °

## 2019-09-27 ENCOUNTER — Other Ambulatory Visit: Payer: Self-pay | Admitting: Registered Nurse

## 2019-09-27 DIAGNOSIS — Z1231 Encounter for screening mammogram for malignant neoplasm of breast: Secondary | ICD-10-CM

## 2019-09-28 ENCOUNTER — Encounter: Payer: Self-pay | Admitting: Registered Nurse

## 2019-10-09 ENCOUNTER — Encounter: Payer: Self-pay | Admitting: Registered Nurse

## 2019-10-09 NOTE — Progress Notes (Addendum)
Brittany Summers is a pleasant 64 y.o. female presenting today for Medicare Annual Wellness Visit.  Date of last exam: unsure  Interpreter used for this visit? no  Patient Care Team: Maximiano Coss, NP as PCP - General (Adult Health Nurse Practitioner) Mauri Pole, MD as Consulting Physician (Gastroenterology)   Other items to address today: follow up on lipids, follow up on BP, blurred vision in L eye    Other Screening: Last screening for diabetes:  No results found for: HGBA1C  Last lipid screening: Lab Results  Component Value Date   CHOL 162 09/26/2019   HDL 64 09/26/2019   LDLCALC 86 09/26/2019   TRIG 58 09/26/2019   CHOLHDL 2.5 09/26/2019     ADVANCE DIRECTIVES: Discussed: yes On File: yes Materials Provided: no  Immunization status:  Immunization History  Administered Date(s) Administered  . Hepatitis A, Adult 10/21/2016, 04/25/2017  . Hepatitis B, adult 10/21/2016, 11/22/2016, 04/25/2017  . Influenza,inj,Quad PF,6+ Mos 03/18/2016, 10/11/2016, 09/12/2017  . PFIZER SARS-COV-2 Vaccination 02/24/2019, 03/20/2019  . Pneumococcal Polysaccharide-23 09/22/2016  . Tdap 03/18/2016     Health Maintenance Due  Topic Date Due  . COLONOSCOPY  05/12/2019  . MAMMOGRAM  07/19/2019     Functional Status Survey: Is the patient deaf or have difficulty hearing?: No Does the patient have difficulty seeing, even when wearing glasses/contacts?: Yes (wears glasses) Does the patient have difficulty concentrating, remembering, or making decisions?: No Does the patient have difficulty walking or climbing stairs?: No Does the patient have difficulty dressing or bathing?: No Does the patient have difficulty doing errands alone such as visiting a doctor's office or shopping?: No   6CIT Screen 09/26/2019  What Year? 0 points  What month? 0 points  What time? 0 points  Count back from 20 0 points  Months in reverse 0 points  Repeat phrase 0 points  Total  Score 0        Office Visit from 09/26/2019 in [facility] at [facility]  AUDIT-C Score 0       Home Environment: no trip hazards. At home with husband. Rails on stairs, grab bars in shower. Tolerates steps without issue.    Patient Active Problem List   Diagnosis Date Noted  . Osteopenia after menopause 07/18/2017  . Hypertension 06/21/2016     Past Medical History:  Diagnosis Date  . Chronic hepatitis C without hepatic coma ([place]) 09/22/2016  . Gallstone    known with h/o cholecystitis pain, no recent recurrence  . Hypertension      Past Surgical History:  Procedure Laterality Date  . BREAST BIOPSY Left 2017  . MAXILLARY SINUS LIFT    . MOUTH SURGERY    . ORIF ANKLE FRACTURE Left 07/06/2018   Procedure: OPEN REDUCTION INTERNAL FIXATION (ORIF) left ankle trimalleolar fracture;  Surgeon: Wylene Simmer, MD;  Location: [facility];  Service: Orthopedics;  Laterality: Left;  33min  . TONSILLECTOMY     age 5     Family History  Problem Relation Age of Onset  . Hypertension Mother   . Breast cancer Mother        Dx in her early 33's  . Hypertension Father   . Diabetes Father   . Leukemia Father   . Breast cancer Maternal Grandmother   . Stomach cancer Maternal Grandmother 90  . Colon cancer Maternal Uncle   . Colon polyps Neg Hx   . Rectal cancer Neg Hx   . Esophageal cancer Neg  Hx      Social History   Socioeconomic History  . Marital status: Married    Spouse name: Not on file  . Number of children: 0  . Years of education: Not on file  . Highest education level: Not on file  Occupational History  . Occupation: retired  Tobacco Use  . Smoking status: Former Smoker    Types: Cigarettes    Quit date: 07/15/2015    Years since quitting: 4.2  . Smokeless tobacco: Never Used  Vaping Use  . Vaping Use: Never used  Substance and Sexual Activity  . Alcohol use: No    Alcohol/week: 0.0 standard drinks    Comment: as of 04-28-16 no alcohol   . Drug  use: No  . Sexual activity: Not Currently    Partners: Male  Other Topics Concern  . Not on file  Social History Narrative  . Not on file   Social Determinants of Health   Financial Resource Strain:   . Difficulty of Paying Living Expenses: Not on file  Food Insecurity:   . Worried About Charity fundraiser in the Last Year: Not on file  . Ran Out of Food in the Last Year: Not on file  Transportation Needs:   . Lack of Transportation (Medical): Not on file  . Lack of Transportation (Non-Medical): Not on file  Physical Activity:   . Days of Exercise per Week: Not on file  . Minutes of Exercise per Session: Not on file  Stress:   . Feeling of Stress : Not on file  Social Connections:   . Frequency of Communication with Friends and Family: Not on file  . Frequency of Social Gatherings with Friends and Family: Not on file  . Attends Religious Services: Not on file  . Active Member of Clubs or Organizations: Not on file  . Attends Archivist Meetings: Not on file  . Marital Status: Not on file  Intimate Partner Violence:   . Fear of Current or Ex-Partner: Not on file  . Emotionally Abused: Not on file  . Physically Abused: Not on file  . Sexually Abused: Not on file     Allergies  Allergen Reactions  . Penicillins     Has patient had a PCN reaction causing immediate rash, facial/tongue/throat swelling, SOB or lightheadedness with hypotension: Y Has patient had a PCN reaction causing severe rash involving mucus membranes or skin necrosis: Y Has patient had a PCN reaction that required hospitalization: N Has patient had a PCN reaction occurring within the last 10 years: Y If all of the above answers are "NO", then may proceed with Cephalosporin use.      Prior to Admission medications   Medication Sig Start Date End Date Taking? Authorizing Provider  cholecalciferol (VITAMIN D) 1000 units tablet Take 1,000 Units by mouth daily.   Yes [provider]      Depression screen [facility] 2/9 09/26/2019 09/12/2017 06/08/2017 04/25/2017 03/24/2017  Decreased Interest 0 0 0 0 0  Down, Depressed, Hopeless 0 0 0 0 0  PHQ - 2 Score 0 0 0 0 0  Altered sleeping 0 - - - -  Tired, decreased energy 0 - - - -  Change in appetite 0 - - - -  Feeling bad or failure about yourself  0 - - - -  Trouble concentrating 0 - - - -  Moving slowly or fidgety/restless 0 - - - -  Suicidal thoughts 0 - - - -  PHQ-9 Score 0 - - - -  Difficult doing work/chores Not difficult at all - - - -     Fall Risk  09/26/2019 09/12/2017 06/08/2017 04/25/2017 03/24/2017  Falls in the past year? 0 No No No No  Number falls in past yr: 0 - - - -  Injury with Fall? 0 - - - -  Follow up Falls evaluation completed - - - -      PHYSICAL EXAM: BP 128/84   Pulse 89   Temp 98 F (36.7 C)   Ht 5\' 1"  (1.549 m)   Wt 144 lb (65.3 kg)   LMP  (LMP Unknown)   SpO2 97%   BMI 27.21 kg/m  .   Wt Readings from Last 3 Encounters:  09/26/19 144 lb (65.3 kg)  07/06/18 154 lb 8.7 oz (70.1 kg)  07/02/18 154 lb 5.2 oz (70 kg)      Hearing Screening   125Hz  250Hz  500Hz  1000Hz  2000Hz  3000Hz  4000Hz  6000Hz  8000Hz   Right ear:           Left ear:             Visual Acuity Screening   Right eye Left eye Both eyes  Without correction:     With correction: 2020 2030 2020      Physical Exam Vitals and nursing note reviewed.  Constitutional:      General: She is not in acute distress.    Appearance: Normal appearance. She is normal weight. She is not ill-appearing, toxic-appearing or diaphoretic.  Cardiovascular:     Rate and Rhythm: Normal rate and regular rhythm.     Pulses: Normal pulses.     Heart sounds: Normal heart sounds. No murmur heard.  No friction rub. No gallop.   Pulmonary:     Effort: Pulmonary effort is normal. No respiratory distress.     Breath sounds: Normal breath sounds. No stridor. No wheezing, rhonchi or rales.  Chest:     Chest wall: No tenderness.  Skin:     Capillary Refill: Capillary refill takes less than 2 seconds.  Neurological:     General: No focal deficit present.     Mental Status: She is alert and oriented to person, place, and time.  Psychiatric:        Mood and Affect: Mood normal.        Behavior: Behavior normal.        Thought Content: Thought content normal.        Judgment: Judgment normal.      Education/Counseling provided regarding diet and exercise, prevention of chronic diseases, smoking/tobacco cessation, if applicable, and reviewed "Covered Medicare Preventive Services."   ASSESSMENT/PLAN: 1. Special screening for malignant neoplasms, colon Refer to gastroenterology  2. Encounter for screening mammogram for malignant neoplasm of breast Order for mammogram placed  3. Screening for cholesterol level Labs collected, will follow up as warranted  4. Encounter for annual wellness exam in Medicare patient Screening completed as above. Pt low fall risk, no evidence of acute cognitive decline. Follow up annually.  5. Essential hypertension Continue lifestyle control.  6. Blurred vision, left eye Refer to ophthalmology for further work up and assessment. No acute red flags today.     Maximiano Coss, NP [facility] [place], [place] [address] [phone] - 0000

## 2019-10-17 ENCOUNTER — Encounter: Payer: Self-pay | Admitting: Gastroenterology

## 2019-10-19 ENCOUNTER — Ambulatory Visit
Admission: RE | Admit: 2019-10-19 | Discharge: 2019-10-19 | Disposition: A | Discharge status: Home / Self Care | Payer: Medicare Other | Source: Ambulatory Visit | Attending: Registered Nurse | Admitting: Registered Nurse

## 2019-10-19 ENCOUNTER — Other Ambulatory Visit: Payer: Self-pay

## 2019-10-19 DIAGNOSIS — Z1231 Encounter for screening mammogram for malignant neoplasm of breast: Secondary | ICD-10-CM

## 2019-10-24 ENCOUNTER — Other Ambulatory Visit: Payer: Self-pay | Admitting: Registered Nurse

## 2019-10-24 DIAGNOSIS — R928 Other abnormal and inconclusive findings on diagnostic imaging of breast: Secondary | ICD-10-CM

## 2019-10-31 ENCOUNTER — Ambulatory Visit
Admission: RE | Admit: 2019-10-31 | Discharge: 2019-10-31 | Disposition: A | Discharge status: Home / Self Care | Payer: Medicare Other | Source: Ambulatory Visit | Attending: Registered Nurse | Admitting: Registered Nurse

## 2019-10-31 ENCOUNTER — Other Ambulatory Visit: Payer: Self-pay

## 2019-10-31 DIAGNOSIS — R928 Other abnormal and inconclusive findings on diagnostic imaging of breast: Secondary | ICD-10-CM

## 2019-11-28 ENCOUNTER — Other Ambulatory Visit: Payer: Self-pay

## 2019-11-28 ENCOUNTER — Ambulatory Visit (AMBULATORY_SURGERY_CENTER): Payer: Self-pay | Admitting: *Deleted

## 2019-11-28 VITALS — Ht 61.0 in | Wt 141.8 lb

## 2019-11-28 DIAGNOSIS — Z86010 Personal history of colonic polyps: Secondary | ICD-10-CM

## 2019-11-28 MED ORDER — SUTAB 1479-225-188 MG PO TABS: 1.0000 | ORAL_TABLET | Freq: Once | ORAL | 0 refills | Status: AC

## 2019-11-28 NOTE — Progress Notes (Signed)
Completed covid vaccines 03-20-19  Pt is aware that care partner will wait in the car during procedure; if they feel like they will be too hot or cold to wait in the car; they may wait in the 4 th floor lobby. Patient is aware to bring only one care partner. We want them to wear a mask (we do not have any that we can provide them), practice social distancing, and we will check their temperatures when they get here.  I did remind the patient that their care partner needs to stay in the parking lot the entire time and have a cell phone available, we will call them when the pt is ready for discharge. Patient will wear mask into building.   No trouble with anesthesia, difficulty with intubation or hx/fam hx of malignant hyperthermia per pt   No egg or soy allergy  No home oxygen use   No medications for weight loss taken  Pt denies constipation issues   Sutab code put into RX and paper copy given to pt to show pharmacy

## 2019-12-11 ENCOUNTER — Other Ambulatory Visit: Payer: Self-pay

## 2019-12-11 ENCOUNTER — Other Ambulatory Visit: Payer: Self-pay | Admitting: Gastroenterology

## 2019-12-11 ENCOUNTER — Encounter: Payer: Self-pay | Admitting: Gastroenterology

## 2019-12-11 ENCOUNTER — Ambulatory Visit (AMBULATORY_SURGERY_CENTER): Payer: Medicare Other | Admitting: Gastroenterology

## 2019-12-11 VITALS — BP 145/80 | HR 71 | Temp 98.0°F | Resp 14 | Ht 61.0 in | Wt 141.0 lb

## 2019-12-11 DIAGNOSIS — D120 Benign neoplasm of cecum: Secondary | ICD-10-CM

## 2019-12-11 DIAGNOSIS — Z86010 Personal history of colonic polyps: Secondary | ICD-10-CM | POA: Diagnosis present

## 2019-12-11 DIAGNOSIS — D122 Benign neoplasm of ascending colon: Secondary | ICD-10-CM

## 2019-12-11 DIAGNOSIS — D125 Benign neoplasm of sigmoid colon: Secondary | ICD-10-CM

## 2019-12-11 MED ORDER — SODIUM CHLORIDE 0.9 % IV SOLN: 500.0000 mL | Freq: Once | INTRAVENOUS | Status: DC

## 2019-12-11 NOTE — Progress Notes (Signed)
To PACU, VSS. Report to Rn,tb 

## 2019-12-11 NOTE — Op Note (Signed)
[facility] Patient Name: Brittany Summers Procedure Date: 12/11/2019 9:24 AM MRN: [id number] Endoscopist: Mauri Pole , MD Age: 64 Referring MD:  Date of Birth: April 06, 1955 Gender: Female Account #: [id number] Procedure:                Colonoscopy Indications:              High risk colon cancer surveillance: Personal                            history of colonic polyps, High risk colon cancer                            surveillance: Personal history of multiple (3 or                            more) adenomas Medicines:                Monitored Anesthesia Care Procedure:                Pre-Anesthesia Assessment:                           - Prior to the procedure, a History and Physical                            was performed, and patient medications and                            allergies were reviewed. The patient's tolerance of                            previous anesthesia was also reviewed. The risks                            and benefits of the procedure and the sedation                            options and risks were discussed with the patient.                            All questions were answered, and informed consent                            was obtained. Prior Anticoagulants: The patient has                            taken no previous anticoagulant or antiplatelet                            agents. ASA Grade Assessment: II - A patient with                            mild systemic disease. After reviewing the risks  and benefits, the patient was deemed in                            satisfactory condition to undergo the procedure.                           After obtaining informed consent, the colonoscope                            was passed under direct vision. Throughout the                            procedure, the patient's blood pressure, pulse, and                            oxygen saturations were monitored  continuously. The                            Colonoscope was introduced through the anus and                            advanced to the the cecum, identified by                            appendiceal orifice and ileocecal valve. The                            colonoscopy was performed without difficulty. The                            patient tolerated the procedure well. The quality                            of the bowel preparation was excellent. The                            ileocecal valve, appendiceal orifice, and rectum                            were photographed. Scope In: 9:38:39 AM Scope Out: 9:59:55 AM Scope Withdrawal Time: 0 hours 12 minutes 48 seconds  Total Procedure Duration: 0 hours 21 minutes 16 seconds  Findings:                 The perianal and digital rectal examinations were                            normal.                           A 7 mm polyp was found in the cecum. The polyp was                            sessile. The polyp was removed with a cold snare.  Resection and retrieval were complete.                           Two sessile polyps were found in the ascending                            colon. The polyps were 1 to 2 mm in size. These                            polyps were removed with a cold biopsy forceps.                            Resection and retrieval were complete.                           A 15 mm polyp was found in the sigmoid colon. The                            polyp was pedunculated. The polyp was removed with                            a hot snare. Resection and retrieval were complete.                           A few small-mouthed diverticula were found in the                            sigmoid colon.                           Non-bleeding internal hemorrhoids were found during                            retroflexion. The hemorrhoids were small. Complications:            No immediate complications. Estimated  Blood Loss:     Estimated blood loss was minimal. Impression:               - One 7 mm polyp in the cecum, removed with a cold                            snare. Resected and retrieved.                           - Two 1 to 2 mm polyps in the ascending colon,                            removed with a cold biopsy forceps. Resected and                            retrieved.                           - One 15 mm polyp in the sigmoid colon, removed  with a hot snare. Resected and retrieved.                           - Diverticulosis in the sigmoid colon.                           - Non-bleeding internal hemorrhoids. Recommendation:           - Patient has a contact number available for                            emergencies. The signs and symptoms of potential                            delayed complications were discussed with the                            patient. Return to normal activities tomorrow.                            Written discharge instructions were provided to the                            patient.                           - Resume previous diet.                           - Continue present medications.                           - Await pathology results.                           - Repeat colonoscopy in 3 years for surveillance. Mauri Pole, MD 12/11/2019 10:07:00 AM This report has been signed electronically.

## 2019-12-11 NOTE — Progress Notes (Signed)
Called to room to assist during endoscopic procedure.  Patient ID and intended procedure confirmed with present staff. Received instructions for my participation in the procedure from the performing physician.  

## 2019-12-11 NOTE — Patient Instructions (Signed)
Please read over handouts about polyps, diverticulosis and hemorrhoids  Await pathology- next colonoscopy 3 years  Continue your normal medications   YOU HAD AN ENDOSCOPIC PROCEDURE TODAY AT [facility]:   Refer to the procedure report that was given to you for any specific questions about what was found during the examination.  If the procedure report does not answer your questions, please call your gastroenterologist to clarify.  If you requested that your care partner not be given the details of your procedure findings, then the procedure report has been included in a sealed envelope for you to review at your convenience later.  YOU SHOULD EXPECT: Some feelings of bloating in the abdomen. Passage of more gas than usual.  Walking can help get rid of the air that was put into your GI tract during the procedure and reduce the bloating. If you had a lower endoscopy (such as a colonoscopy or flexible sigmoidoscopy) you may notice spotting of blood in your stool or on the toilet paper. If you underwent a bowel prep for your procedure, you may not have a normal bowel movement for a few days.  Please Note:  You might notice some irritation and congestion in your nose or some drainage.  This is from the oxygen used during your procedure.  There is no need for concern and it should clear up in a day or so.  SYMPTOMS TO REPORT IMMEDIATELY:   Following lower endoscopy (colonoscopy or flexible sigmoidoscopy):  Excessive amounts of blood in the stool  Significant tenderness or worsening of abdominal pains  Swelling of the abdomen that is new, acute  Fever of 100F or higher   Following upper endoscopy (EGD)  Vomiting of blood or coffee ground material  New chest pain or pain under the shoulder blades  Painful or persistently difficult swallowing  New shortness of breath  Fever of 100F or higher  Black, tarry-looking stools  For urgent or emergent issues, a gastroenterologist can  be reached at any hour by calling [phone]. Do not use MyChart messaging for urgent concerns.    DIET:  We do recommend a small meal at first, but then you may proceed to your regular diet.  Drink plenty of fluids but you should avoid alcoholic beverages for 24 hours.  ACTIVITY:  You should plan to take it easy for the rest of today and you should NOT DRIVE or use heavy machinery until tomorrow (because of the sedation medicines used during the test).    FOLLOW UP: Our staff will call the number listed on your records 48-72 hours following your procedure to check on you and address any questions or concerns that you may have regarding the information given to you following your procedure. If we do not reach you, we will leave a message.  We will attempt to reach you two times.  During this call, we will ask if you have developed any symptoms of COVID 19. If you develop any symptoms (ie: fever, flu-like symptoms, shortness of breath, cough etc.) before then, please call [phone].  If you test positive for Covid 19 in the 2 weeks post procedure, please call and report this information to [place].    If any biopsies were taken you will be contacted by phone or by letter within the next 1-3 weeks.  Please call us at [phone] if you have not heard about the biopsies in 3 weeks.    SIGNATURES/CONFIDENTIALITY: You and/or your care partner have signed  paperwork which will be entered into your electronic medical record.  These signatures attest to the fact that that the information above on your After Visit Summary has been reviewed and is understood.  Full responsibility of the confidentiality of this discharge information lies with you and/or your care-partner.

## 2019-12-11 NOTE — Progress Notes (Signed)
Pt's states no medical or surgical changes since previsit or office visit. 

## 2019-12-12 ENCOUNTER — Telehealth: Payer: Self-pay

## 2019-12-12 NOTE — Telephone Encounter (Signed)
  Follow up Call-  Call back number 12/11/2019 09/30/2017  Post procedure Call Back phone  # [phone]  Permission to leave phone message Yes Yes  Some recent data might be hidden     Patient questions:  Do you have a fever, pain , or abdominal swelling? No. Pain Score  0 *  Have you tolerated food without any problems? Yes.    Have you been able to return to your normal activities? Yes.    Do you have any questions about your discharge instructions: Diet   No. Medications  No. Follow up visit  No.  Do you have questions or concerns about your Care? No.  Actions: * If pain score is 4 or above: No action needed, pain <4.  1. Have you developed a fever since your procedure? no  2.   Have you had an respiratory symptoms (SOB or cough) since your procedure? no  3.   Have you tested positive for COVID 19 since your procedure no  4.   Have you had any family members/close contacts diagnosed with the COVID 19 since your procedure?  no   If yes to any of these questions please route to Joylene John, RN and Joella Prince, RN

## 2019-12-31 ENCOUNTER — Encounter: Payer: Self-pay | Admitting: Gastroenterology

## 2020-06-29 IMAGING — CT CT OF THE LEFT ANKLE WITHOUT CONTRAST
3 of 4 series · 15 of 33 positions shown, 17 images · non-contrast
Comparison: Radiographs yesterday.

CLINICAL DATA: Ankle trauma, osteochondral injury on xray.
Trimalleolar fracture.

EXAM:
CT OF THE LEFT ANKLE WITHOUT CONTRAST
TECHNIQUE: Multidetector CT imaging of the left ankle was performed according
to the standard protocol. Multiplanar CT image reconstructions were
also generated.

[Series 4: axial st · axial · 0.33mm/px · z∈[-174,-42]mm · 7 of 105 slices shown, 9 images]
[im 9/105  soft-tissue]
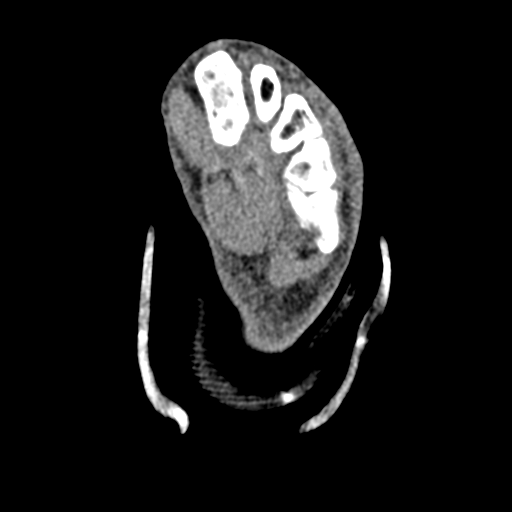
[im 9/105  bone]
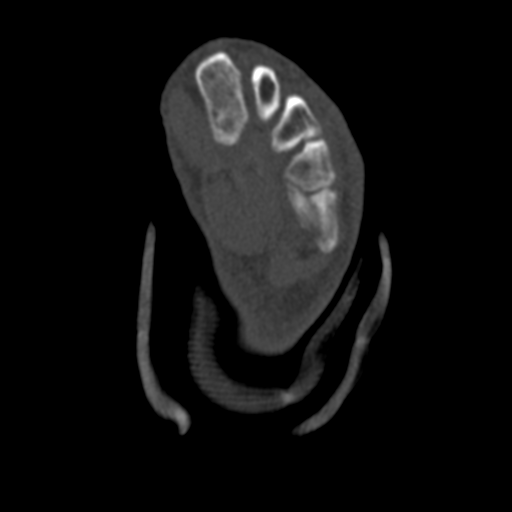
[im 25/105  bone]
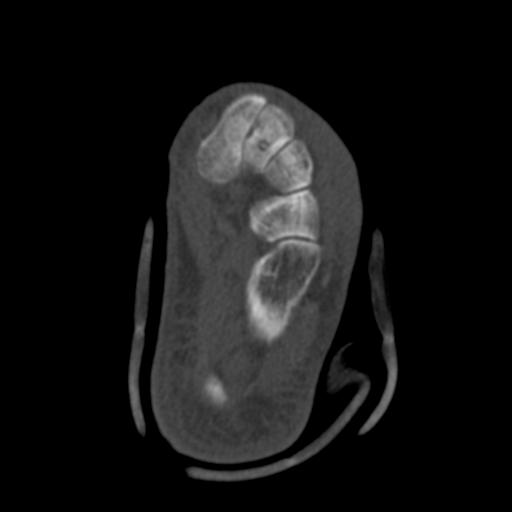
[im 41/105  bone]
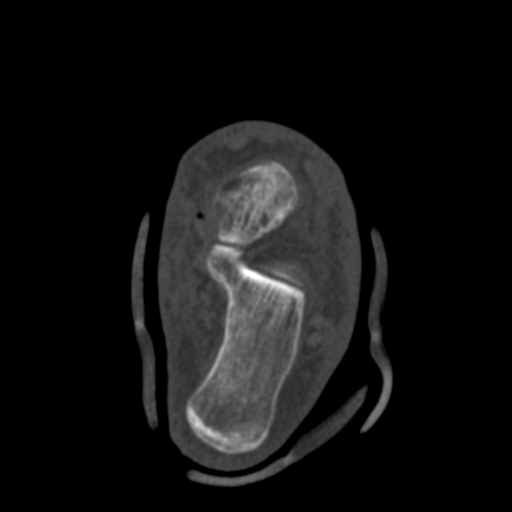
[im 57/105  bone]
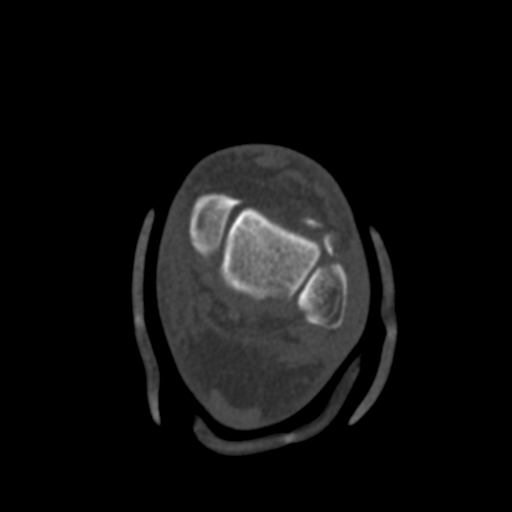
[im 65/105  soft-tissue]
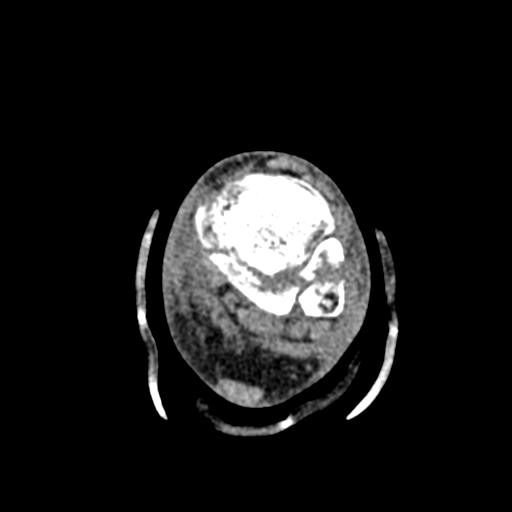
[im 65/105  bone]
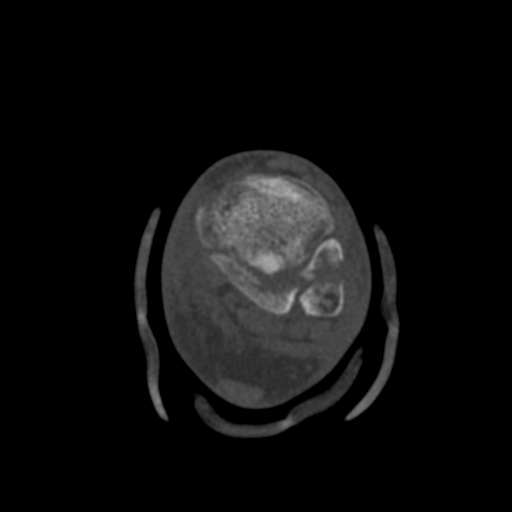
[im 81/105  bone]
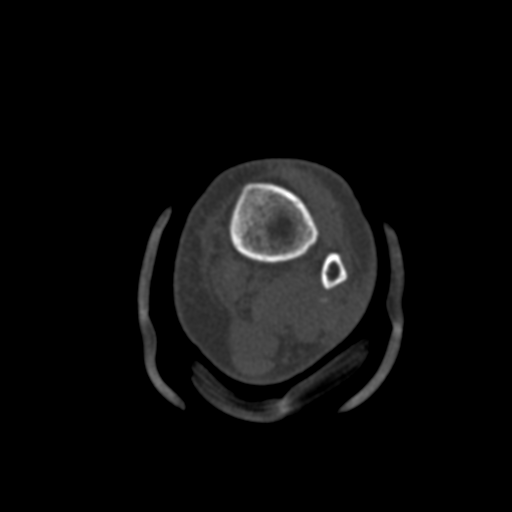
[im 97/105  bone]
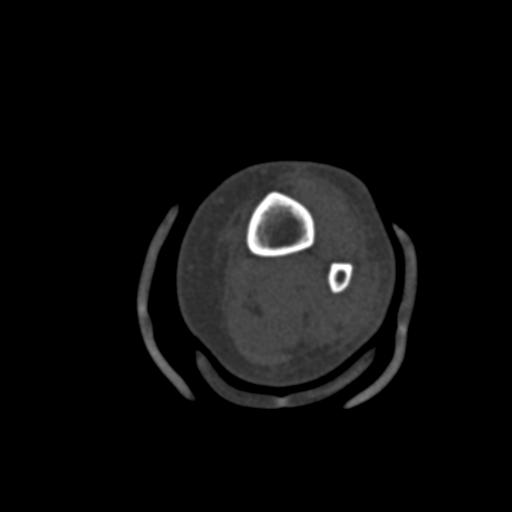

[Series 9: coronal st · coronal · 0.17mm/px · 3 of 96 slices shown]
[im 20/96  bone]
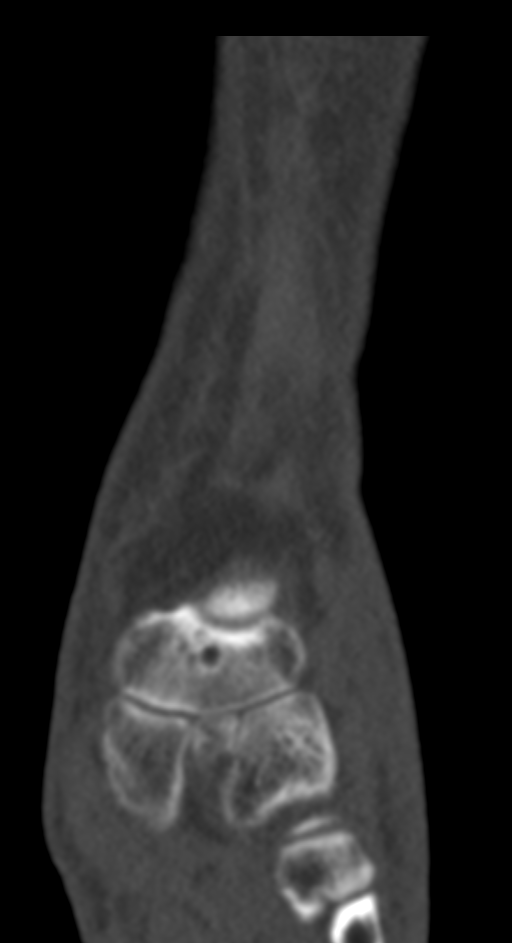
[im 39/96  bone]
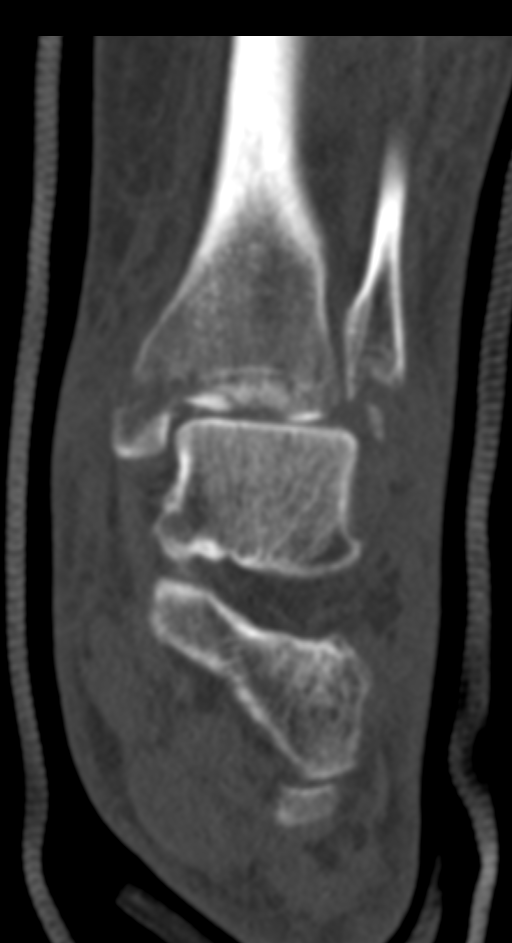
[im 58/96  bone]
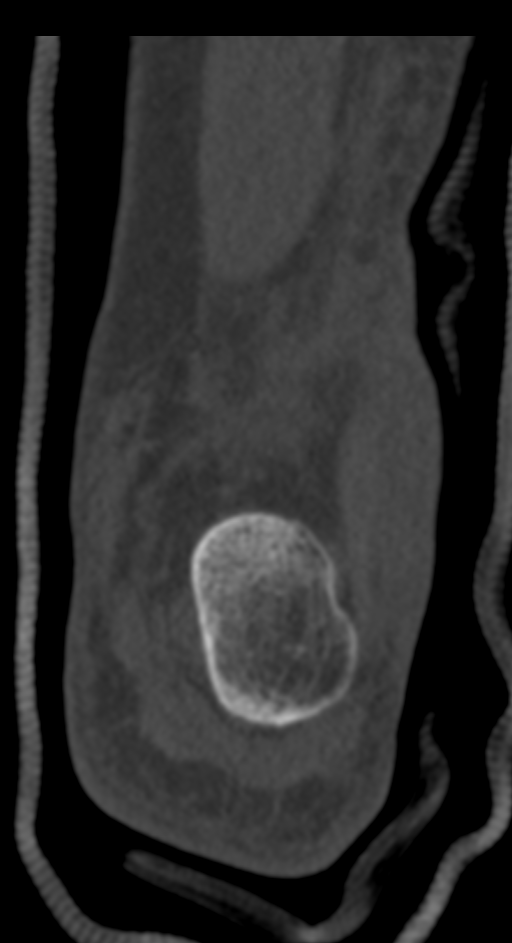

[Series 10: sagittal st · sagittal · 0.31mm/px · 5 of 69 slices shown]
[im 12/69  bone]
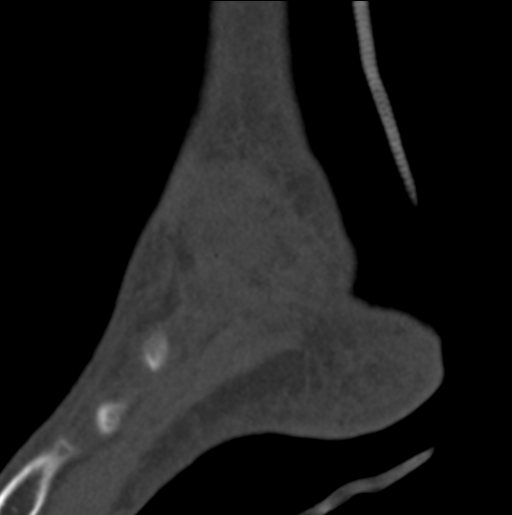
[im 23/69  bone]
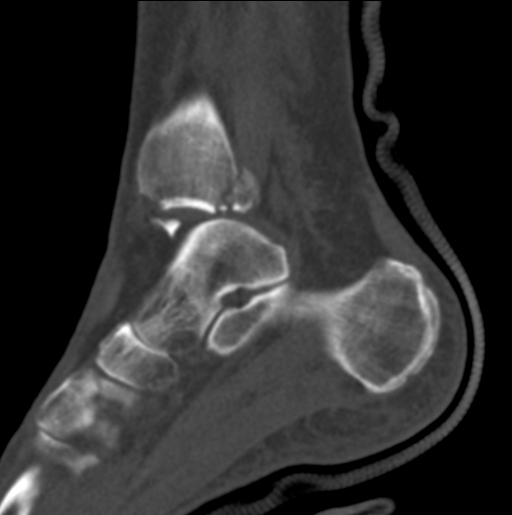
[im 35/69  bone]
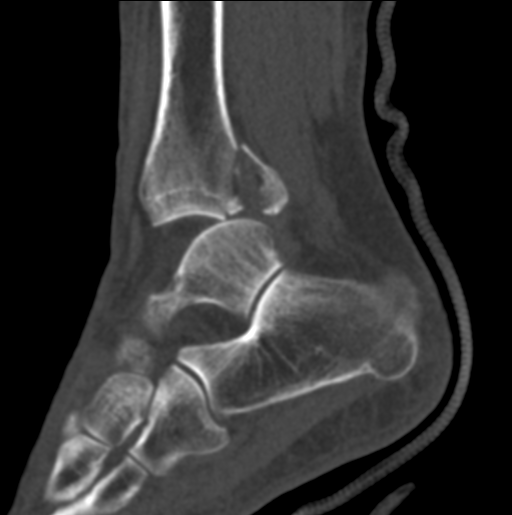
[im 46/69  bone]
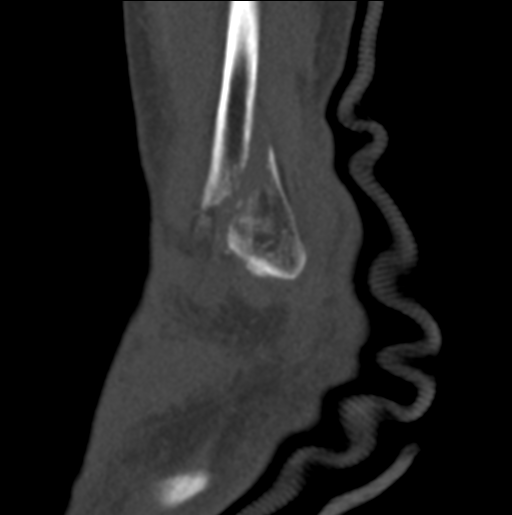
[im 57/69  bone]
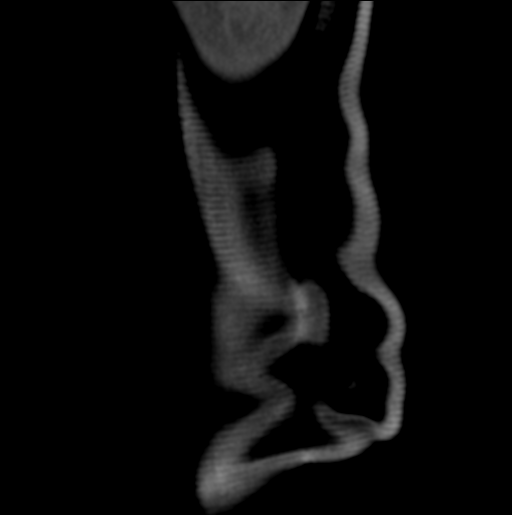

[15 of 33 positions shown; findings below may reference images not displayed]

FINDINGS: Bones/Joint/Cartilage

Trimalleolar ankle fracture. Transverse displaced fracture through
the medial malleolus at the level of the ankle mortise, fracture
extends to the anterior cortex. Posterior malleolar fracture is
comminuted and displaced with 7 mm osseous distraction and 2 mm
articular depression. Comminuted primarily oblique distal fibular
fracture at the level of the ankle mortise. There is mortise
widening anteriorly, distal tibia appears perched on the posterior
talar dome. Small fracture fragments at the articular surface. No
fracture of the talus, calcaneus, or included mid/hindfoot.

Ligaments

Suboptimally assessed by CT.

Muscles and Tendons

No definite entrapment of the flexor or peroneal tendons.

Soft tissues

Diffuse soft tissue edema about the ankle.
IMPRESSION: Displaced trimalleolar fracture. Osseous destruction of the
posterior malleolar fracture approximately 7 mm. Small fracture
fragments at the articular surface. Widening of the ankle mortise
anteriorly.

## 2022-02-10 ENCOUNTER — Encounter: Payer: Self-pay | Admitting: Family Medicine

## 2022-02-10 ENCOUNTER — Ambulatory Visit (INDEPENDENT_AMBULATORY_CARE_PROVIDER_SITE_OTHER): Payer: Medicare Other | Admitting: Family Medicine

## 2022-02-10 VITALS — BP 140/90 | HR 94 | Temp 97.8°F | Ht 61.0 in | Wt 127.0 lb

## 2022-02-10 DIAGNOSIS — J439 Emphysema, unspecified: Secondary | ICD-10-CM

## 2022-02-10 DIAGNOSIS — M79672 Pain in left foot: Secondary | ICD-10-CM | POA: Diagnosis not present

## 2022-02-10 DIAGNOSIS — I10 Essential (primary) hypertension: Secondary | ICD-10-CM

## 2022-02-10 DIAGNOSIS — G8929 Other chronic pain: Secondary | ICD-10-CM

## 2022-02-10 DIAGNOSIS — K8020 Calculus of gallbladder without cholecystitis without obstruction: Secondary | ICD-10-CM

## 2022-02-10 DIAGNOSIS — R109 Unspecified abdominal pain: Secondary | ICD-10-CM | POA: Diagnosis not present

## 2022-02-10 DIAGNOSIS — I700 Atherosclerosis of aorta: Secondary | ICD-10-CM

## 2022-02-10 NOTE — Progress Notes (Signed)
New Patient Office Visit  Subjective    Patient ID: Brittany Summers, female    DOB: 1955-12-13  Age: 67 y.o. MRN: [id number]  CC:  Chief Complaint  Patient presents with   Establish Care    No PCP for 2 years, wants to check on BP and has been having some circulation issues she thinks as her left foot toes have been cold but also has hx of broken ankle with 10 screws and a metal plate and not sure if that relates.     HPI Brittany Summers presents to establish care No PCP in at least 2 years.   Elevated BP at home. 140s.  She was on low dose diuretic.   Intermittent abdominal pain.  Hx of gallstones and did not have her gallbladder removed. GI referred her in the past.   COPD on CT in 2020  Aortic atherosclerosis on CT   Denies fever, chills, dizziness, chest pain, palpitations, shortness of breath, abdominal pain, N/V/D, urinary symptoms, LE edema.     Outpatient Encounter Medications as of 02/10/2022  Medication Sig   [DISCONTINUED] cholecalciferol (VITAMIN D) 1000 units tablet Take 1,000 Units by mouth daily. (Patient not taking: Reported on 02/10/2022)   [DISCONTINUED] Cholecalciferol (VITAMIN D3 PO) daily. (Patient not taking: Reported on 02/10/2022)   No facility-administered encounter medications on file as of 02/10/2022.    Past Medical History:  Diagnosis Date   Chronic hepatitis C without hepatic coma ([place]) 09/22/2016   finished treatment for this   Gallstone    known with h/o cholecystitis pain, no recent recurrence   Hypertension    no meds taken    Past Surgical History:  Procedure Laterality Date   BREAST BIOPSY Left 2017   COLONOSCOPY     MAXILLARY SINUS LIFT     MOUTH SURGERY     ORIF ANKLE FRACTURE Left 07/06/2018   Procedure: OPEN REDUCTION INTERNAL FIXATION (ORIF) left ankle trimalleolar fracture;  Surgeon: Wylene Simmer, MD;  Location: [facility];  Service: Orthopedics;  Laterality: Left;  32mn   TONSILLECTOMY     age 67   UPPER GASTROINTESTINAL ENDOSCOPY      Family History  Problem Relation Age of Onset   Hypertension Mother    Breast cancer Mother        Dx in her early 669's  Hypertension Father    Diabetes Father    Leukemia Father    Breast cancer Maternal Grandmother    Stomach cancer Maternal Grandmother 90   Colon cancer Maternal Uncle    Colon polyps Neg Hx    Rectal cancer Neg Hx    Esophageal cancer Neg Hx     Social History   Socioeconomic History   Marital status: Married    Spouse name: Not on file   Number of children: 0   Years of education: Not on file   Highest education level: Not on file  Occupational History   Occupation: retired  Tobacco Use   Smoking status: Former    Types: Cigarettes    Quit date: 07/15/2015    Years since quitting: 6.5   Smokeless tobacco: Never  Vaping Use   Vaping Use: Never used  Substance and Sexual Activity   Alcohol use: No    Alcohol/week: 0.0 standard drinks of alcohol    Comment: as of 04-28-16 no alcohol    Drug use: No   Sexual activity: Not Currently    Partners: Male  Other  Topics Concern   Not on file  Social History Narrative   Not on file   Social Determinants of Health   Financial Resource Strain: Not on file  Food Insecurity: Not on file  Transportation Needs: Not on file  Physical Activity: Not on file  Stress: Not on file  Social Connections: Not on file  Intimate Partner Violence: Not on file    ROS      Objective    BP (!) 140/90 (BP Location: Left Arm, Patient Position: Sitting, Cuff Size: Normal)   Pulse 94   Temp 97.8 F (36.6 C) (Temporal)   Ht '5\' 1"'$  (1.549 m)   Wt 127 lb (57.6 kg)   LMP  (LMP Unknown)   SpO2 100%   BMI 24.00 kg/m   Physical Exam Constitutional:      General: She is not in acute distress.    Appearance: She is not ill-appearing.  Cardiovascular:     Rate and Rhythm: Normal rate.  Pulmonary:     Effort: Pulmonary effort is normal.  Musculoskeletal:     Comments:  Bilateral LEs are neurovascularly intact.   Skin:    General: Skin is warm and dry.  Neurological:     General: No focal deficit present.     Mental Status: She is alert and oriented to person, place, and time.  Psychiatric:        Mood and Affect: Mood normal.        Behavior: Behavior normal.         Assessment & Plan:   Problem List Items Addressed This Visit       Cardiovascular and Mediastinum   Hypertension - Primary   Other Visit Diagnoses     Intermittent abdominal pain       Relevant Orders   Ambulatory referral to General Surgery   Calculus of gallbladder without cholecystitis without obstruction       Relevant Orders   Ambulatory referral to General Surgery   Chronic pain in left foot       Relevant Orders   Ambulatory referral to Podiatry   Aortic atherosclerosis ([place])       Pulmonary emphysema, unspecified emphysema type ([place])          Reviewed notes and CT from 2020 showing aortic atherosclerosis and emphysema. Discussed findings with patient.   Please keep an eye on your blood pressure over the next 4 weeks.  Goal blood pressure is 130/80 or lower.  See the eating plan below on tips for managing blood pressure with diet and exercise.  Follow-up with me in 4 weeks with your blood pressure readings.  You should hear from [facility] surgery to schedule a visit regarding gallstones and abdominal pain intermittently.  You should hear from [facility] regarding evaluation of your left foot.  Return for needs telephone call for AWV with nurse in the next 4 weeks. follow up with me in office in 4 weeks.   Harland Dingwall, NP-C

## 2022-02-10 NOTE — Patient Instructions (Signed)
Please keep an eye on your blood pressure over the next 4 weeks.  Goal blood pressure is 130/80 or lower.  See the eating plan below on tips for managing blood pressure with diet and exercise.  Follow-up with me in 4 weeks with your blood pressure readings.  You should hear from [facility] surgery to schedule a visit regarding gallstones and abdominal pain intermittently.  You should hear from [facility] regarding evaluation of your left foot.    DASH Eating Plan DASH stands for Dietary Approaches to Stop Hypertension. The DASH eating plan is a healthy eating plan that has been shown to: Reduce high blood pressure (hypertension). Reduce your risk for type 2 diabetes, heart disease, and stroke. Help with weight loss. What are tips for following this plan? Reading food labels Check food labels for the amount of salt (sodium) per serving. Choose foods with less than 5 percent of the Daily Value of sodium. Generally, foods with less than 300 milligrams (mg) of sodium per serving fit into this eating plan. To find whole grains, look for the word "whole" as the first word in the ingredient list. Shopping Buy products labeled as "low-sodium" or "no salt added." Buy fresh foods. Avoid canned foods and pre-made or frozen meals. Cooking Avoid adding salt when cooking. Use salt-free seasonings or herbs instead of table salt or sea salt. Check with your health care provider or pharmacist before using salt substitutes. Do not fry foods. Cook foods using healthy methods such as baking, boiling, grilling, roasting, and broiling instead. Cook with heart-healthy oils, such as olive, canola, avocado, soybean, or sunflower oil. Meal planning  Eat a balanced diet that includes: 4 or more servings of fruits and 4 or more servings of vegetables each day. Try to fill one-half of your plate with fruits and vegetables. 6-8 servings of whole grains each day. Less than 6 oz (170 g) of lean meat,  poultry, or fish each day. A 3-oz (85-g) serving of meat is about the same size as a deck of cards. One egg equals 1 oz (28 g). 2-3 servings of low-fat dairy each day. One serving is 1 cup (237 mL). 1 serving of nuts, seeds, or beans 5 times each week. 2-3 servings of heart-healthy fats. Healthy fats called omega-3 fatty acids are found in foods such as walnuts, flaxseeds, fortified milks, and eggs. These fats are also found in cold-water fish, such as sardines, salmon, and mackerel. Limit how much you eat of: Canned or prepackaged foods. Food that is high in trans fat, such as some fried foods. Food that is high in saturated fat, such as fatty meat. Desserts and other sweets, sugary drinks, and other foods with added sugar. Full-fat dairy products. Do not salt foods before eating. Do not eat more than 4 egg yolks a week. Try to eat at least 2 vegetarian meals a week. Eat more home-cooked food and less restaurant, buffet, and fast food. Lifestyle When eating at a restaurant, ask that your food be prepared with less salt or no salt, if possible. If you drink alcohol: Limit how much you use to: 0-1 drink a day for women who are not pregnant. 0-2 drinks a day for men. Be aware of how much alcohol is in your drink. In the [place] one drink equals one 12 oz bottle of beer (355 mL), one 5 oz glass of wine (148 mL), or one 1 oz glass of hard liquor (44 mL). General information Avoid eating more than  2,300 mg of salt a day. If you have hypertension, you may need to reduce your sodium intake to 1,500 mg a day. Work with your health care provider to maintain a healthy body weight or to lose weight. Ask what an ideal weight is for you. Get at least 30 minutes of exercise that causes your heart to beat faster (aerobic exercise) most days of the week. Activities may include walking, swimming, or biking. Work with your health care provider or dietitian to adjust your eating plan to your individual calorie  needs. What foods should I eat? Fruits All fresh, dried, or frozen fruit. Canned fruit in natural juice (without added sugar). Vegetables Fresh or frozen vegetables (raw, steamed, roasted, or grilled). Low-sodium or reduced-sodium tomato and vegetable juice. Low-sodium or reduced-sodium tomato sauce and tomato paste. Low-sodium or reduced-sodium canned vegetables. Grains Whole-grain or whole-wheat bread. Whole-grain or whole-wheat pasta. Brown rice. [place]. Bulgur. Whole-grain and low-sodium cereals. Pita bread. Low-fat, low-sodium crackers. Whole-wheat flour tortillas. Meats and other proteins Skinless chicken or [place]. Ground chicken or [place]. Pork with fat trimmed off. Fish and seafood. Egg whites. Dried beans, peas, or lentils. Unsalted nuts, nut butters, and seeds. Unsalted canned beans. Lean cuts of beef with fat trimmed off. Low-sodium, lean precooked or cured meat, such as sausages or meat loaves. Dairy Low-fat (1%) or fat-free (skim) milk. Reduced-fat, low-fat, or fat-free cheeses. Nonfat, low-sodium ricotta or cottage cheese. Low-fat or nonfat yogurt. Low-fat, low-sodium cheese. Fats and oils Soft margarine without trans fats. Vegetable oil. Reduced-fat, low-fat, or light mayonnaise and salad dressings (reduced-sodium). Canola, safflower, olive, avocado, soybean, and sunflower oils. Avocado. Seasonings and condiments Herbs. Spices. Seasoning mixes without salt. Other foods Unsalted popcorn and pretzels. Fat-free sweets. The items listed above may not be a complete list of foods and beverages you can eat. Contact a dietitian for more information. What foods should I avoid? Fruits Canned fruit in a light or heavy syrup. Fried fruit. Fruit in cream or butter sauce. Vegetables Creamed or fried vegetables. Vegetables in a cheese sauce. Regular canned vegetables (not low-sodium or reduced-sodium). Regular canned tomato sauce and paste (not low-sodium or reduced-sodium). Regular  tomato and vegetable juice (not low-sodium or reduced-sodium). Angie Fava. Olives. Grains Baked goods made with fat, such as croissants, muffins, or some breads. Dry pasta or rice meal packs. Meats and other proteins Fatty cuts of meat. Ribs. Fried meat. Berniece Salines. Bologna, salami, and other precooked or cured meats, such as sausages or meat loaves. Fat from the back of a pig (fatback). Bratwurst. Salted nuts and seeds. Canned beans with added salt. Canned or smoked fish. Whole eggs or egg yolks. Chicken or [place] with skin. Dairy Whole or 2% milk, cream, and half-and-half. Whole or full-fat cream cheese. Whole-fat or sweetened yogurt. Full-fat cheese. Nondairy creamers. Whipped toppings. Processed cheese and cheese spreads. Fats and oils Butter. Stick margarine. Lard. Shortening. Ghee. Bacon fat. Tropical oils, such as coconut, palm kernel, or palm oil. Seasonings and condiments Onion salt, garlic salt, seasoned salt, table salt, and sea salt. Worcestershire sauce. Tartar sauce. Barbecue sauce. Teriyaki sauce. Soy sauce, including reduced-sodium. Steak sauce. Canned and packaged gravies. Fish sauce. Oyster sauce. Cocktail sauce. Store-bought horseradish. Ketchup. Mustard. Meat flavorings and tenderizers. Bouillon cubes. Hot sauces. Pre-made or packaged marinades. Pre-made or packaged taco seasonings. Relishes. Regular salad dressings. Other foods Salted popcorn and pretzels. The items listed above may not be a complete list of foods and beverages you should avoid. Contact a dietitian for more information. Where to  find more information National Heart, Lung, and Blood Institute: https://wilson-eaton.com/ American Heart Association: www.heart.org Academy of Nutrition and Dietetics: www.eatright.[place]: www.kidney.org Summary The DASH eating plan is a healthy eating plan that has been shown to reduce high blood pressure (hypertension). It may also reduce your risk for type 2 diabetes,  heart disease, and stroke. When on the DASH eating plan, aim to eat more fresh fruits and vegetables, whole grains, lean proteins, low-fat dairy, and heart-healthy fats. With the DASH eating plan, you should limit salt (sodium) intake to 2,300 mg a day. If you have hypertension, you may need to reduce your sodium intake to 1,500 mg a day. Work with your health care provider or dietitian to adjust your eating plan to your individual calorie needs. This information is not intended to replace advice given to you by your health care provider. Make sure you discuss any questions you have with your health care provider. Document Revised: 12/08/2018 Document Reviewed: 12/08/2018 Elsevier Patient Education  [place].

## 2022-02-16 ENCOUNTER — Ambulatory Visit: Payer: Medicare Other | Admitting: Podiatry

## 2022-03-11 ENCOUNTER — Ambulatory Visit (INDEPENDENT_AMBULATORY_CARE_PROVIDER_SITE_OTHER): Payer: Medicare Other

## 2022-03-11 VITALS — Ht 61.0 in | Wt 124.0 lb

## 2022-03-11 DIAGNOSIS — Z0000 Encounter for general adult medical examination without abnormal findings: Secondary | ICD-10-CM

## 2022-03-11 NOTE — Patient Instructions (Signed)
Brittany Summers , Thank you for taking time to come for your Medicare Wellness Visit. I appreciate your ongoing commitment to your health goals. Please review the following plan we discussed and let me know if I can assist you in the future.   These are the goals we discussed:  Goals      Patient Stated     Maintain my health and continue to be to primary caregiver for my husband, who has dementia.        This is a list of the screening recommended for you and due dates:  Health Maintenance  Topic Date Due   Pneumonia Vaccine (2 of 2 - PCV) 09/22/2017   COVID-19 Vaccine (3 - 2023-24 season) 09/18/2021   Mammogram  10/18/2021   Flu Shot  04/18/2022*   Zoster (Shingles) Vaccine (1 of 2) 05/12/2022*   Colon Cancer Screening  12/11/2022   Medicare Annual Wellness Visit  03/12/2023   DTaP/Tdap/Td vaccine (2 - Td or Tdap) 03/19/2026   DEXA scan (bone density measurement)  Completed   Hepatitis C Screening: USPSTF Recommendation to screen - Ages 78-79 yo.  Completed   HPV Vaccine  Aged Out  *Topic was postponed. The date shown is not the original due date.    Advanced directives: No  Conditions/risks identified: Yes  Next appointment: Follow up in one year for your annual wellness visit.   Preventive Care 19 Years and Older, Female Preventive care refers to lifestyle choices and visits with your health care provider that can promote health and wellness. What does preventive care include? A yearly physical exam. This is also called an annual well check. Dental exams once or twice a year. Routine eye exams. Ask your health care provider how often you should have your eyes checked. Personal lifestyle choices, including: Daily care of your teeth and gums. Regular physical activity. Eating a healthy diet. Avoiding tobacco and drug use. Limiting alcohol use. Practicing safe sex. Taking low-dose aspirin every day. Taking vitamin and mineral supplements as recommended by your health  care provider. What happens during an annual well check? The services and screenings done by your health care provider during your annual well check will depend on your age, overall health, lifestyle risk factors, and family history of disease. Counseling  Your health care provider may ask you questions about your: Alcohol use. Tobacco use. Drug use. Emotional well-being. Home and relationship well-being. Sexual activity. Eating habits. History of falls. Memory and ability to understand (cognition). Work and work Statistician. Reproductive health. Screening  You may have the following tests or measurements: Height, weight, and BMI. Blood pressure. Lipid and cholesterol levels. These may be checked every 5 years, or more frequently if you are over 95 years old. Skin check. Lung cancer screening. You may have this screening every year starting at age 33 if you have a 30-pack-year history of smoking and currently smoke or have quit within the past 15 years. Fecal occult blood test (FOBT) of the stool. You may have this test every year starting at age 40. Flexible sigmoidoscopy or colonoscopy. You may have a sigmoidoscopy every 5 years or a colonoscopy every 10 years starting at age 52. Hepatitis C blood test. Hepatitis B blood test. Sexually transmitted disease (STD) testing. Diabetes screening. This is done by checking your blood sugar (glucose) after you have not eaten for a while (fasting). You may have this done every 1-3 years. Bone density scan. This is done to screen for osteoporosis. You may have  this done starting at age 58. Mammogram. This may be done every 1-2 years. Talk to your health care provider about how often you should have regular mammograms. Talk with your health care provider about your test results, treatment options, and if necessary, the need for more tests. Vaccines  Your health care provider may recommend certain vaccines, such as: Influenza vaccine. This is  recommended every year. Tetanus, diphtheria, and acellular pertussis (Tdap, Td) vaccine. You may need a Td booster every 10 years. Zoster vaccine. You may need this after age 61. Pneumococcal 13-valent conjugate (PCV13) vaccine. One dose is recommended after age 23. Pneumococcal polysaccharide (PPSV23) vaccine. One dose is recommended after age 51. Talk to your health care provider about which screenings and vaccines you need and how often you need them. This information is not intended to replace advice given to you by your health care provider. Make sure you discuss any questions you have with your health care provider. Document Released: 01/31/2015 Document Revised: 09/24/2015 Document Reviewed: 11/05/2014 Elsevier Interactive Patient Education  2017 [place] Prevention in the Home Falls can cause injuries. They can happen to people of all ages. There are many things you can do to make your home safe and to help prevent falls. What can I do on the outside of my home? Regularly fix the edges of walkways and driveways and fix any cracks. Remove anything that might make you trip as you walk through a door, such as a raised step or threshold. Trim any bushes or trees on the path to your home. Use bright outdoor lighting. Clear any walking paths of anything that might make someone trip, such as rocks or tools. Regularly check to see if handrails are loose or broken. Make sure that both sides of any steps have handrails. Any raised decks and porches should have guardrails on the edges. Have any leaves, snow, or ice cleared regularly. Use sand or salt on walking paths during winter. Clean up any spills in your garage right away. This includes oil or grease spills. What can I do in the bathroom? Use night lights. Install grab bars by the toilet and in the tub and shower. Do not use towel bars as grab bars. Use non-skid mats or decals in the tub or shower. If you need to sit down in  the shower, use a plastic, non-slip stool. Keep the floor dry. Clean up any water that spills on the floor as soon as it happens. Remove soap buildup in the tub or shower regularly. Attach bath mats securely with double-sided non-slip rug tape. Do not have throw rugs and other things on the floor that can make you trip. What can I do in the bedroom? Use night lights. Make sure that you have a light by your bed that is easy to reach. Do not use any sheets or blankets that are too big for your bed. They should not hang down onto the floor. Have a firm chair that has side arms. You can use this for support while you get dressed. Do not have throw rugs and other things on the floor that can make you trip. What can I do in the kitchen? Clean up any spills right away. Avoid walking on wet floors. Keep items that you use a lot in easy-to-reach places. If you need to reach something above you, use a strong step stool that has a grab bar. Keep electrical cords out of the way. Do not use floor polish or  wax that makes floors slippery. If you must use wax, use non-skid floor wax. Do not have throw rugs and other things on the floor that can make you trip. What can I do with my stairs? Do not leave any items on the stairs. Make sure that there are handrails on both sides of the stairs and use them. Fix handrails that are broken or loose. Make sure that handrails are as long as the stairways. Check any carpeting to make sure that it is firmly attached to the stairs. Fix any carpet that is loose or worn. Avoid having throw rugs at the top or bottom of the stairs. If you do have throw rugs, attach them to the floor with carpet tape. Make sure that you have a light switch at the top of the stairs and the bottom of the stairs. If you do not have them, ask someone to add them for you. What else can I do to help prevent falls? Wear shoes that: Do not have high heels. Have rubber bottoms. Are comfortable  and fit you well. Are closed at the toe. Do not wear sandals. If you use a stepladder: Make sure that it is fully opened. Do not climb a closed stepladder. Make sure that both sides of the stepladder are locked into place. Ask someone to hold it for you, if possible. Clearly mark and make sure that you can see: Any grab bars or handrails. First and last steps. Where the edge of each step is. Use tools that help you move around (mobility aids) if they are needed. These include: Canes. Walkers. Scooters. Crutches. Turn on the lights when you go into a dark area. Replace any light bulbs as soon as they burn out. Set up your furniture so you have a clear path. Avoid moving your furniture around. If any of your floors are uneven, fix them. If there are any pets around you, be aware of where they are. Review your medicines with your doctor. Some medicines can make you feel dizzy. This can increase your chance of falling. Ask your doctor what other things that you can do to help prevent falls. This information is not intended to replace advice given to you by your health care provider. Make sure you discuss any questions you have with your health care provider. Document Released: 10/31/2008 Document Revised: 06/12/2015 Document Reviewed: 02/08/2014 Elsevier Interactive Patient Education  2017 [employer].

## 2022-03-11 NOTE — Progress Notes (Signed)
I connected with  Brittany Summers on 03/11/2022 at 10:00 a.m. EST by telephone and verified that I am speaking with the correct person using two identifiers.  Location: Patient: Home Provider: [place] Persons participating in the virtual visit: [place]   I discussed the limitations, risks, security and privacy concerns of performing an evaluation and management service by telephone and the availability of in person appointments. The patient expressed understanding and agreed to proceed.  Interactive audio and video telecommunications were attempted between this nurse and patient, however failed, due to patient having technical difficulties OR patient did not have access to video capability.  We continued and completed visit with audio only.  Some vital signs may be absent or patient reported.   Sheral Flow, LPN  Subjective:   Brittany Summers is a 67 y.o. female who presents for Medicare Annual (Subsequent) preventive examination.  Review of Systems     Cardiac Risk Factors include: advanced age (>90mn, >>21women);family history of premature cardiovascular disease;hypertension (no meds for HTN)     Objective:    Today's Vitals   03/11/22 1002  Weight: 124 lb (56.2 kg)  Height: 5' 1"$  (1.549 m)  PainSc: 0-No pain   Body mass index is 23.43 kg/m.     03/11/2022   10:04 AM 07/06/2018   10:55 AM 07/05/2018    4:43 PM 07/02/2018   10:49 PM 05/11/2016    7:55 AM 04/28/2016    2:18 PM  Advanced Directives  Does Patient Have a Medical Advance Directive? No No No No No No  Would patient like information on creating a medical advance directive? No - Patient declined No - Patient declined  No - Patient declined      Current Medications (verified) No outpatient encounter medications on file as of 03/11/2022.   No facility-administered encounter medications on file as of 03/11/2022.    Allergies (verified) Penicillins   History: Past Medical  History:  Diagnosis Date   Chronic hepatitis C without hepatic coma [place] 09/22/[place]   finished treatment for this   Gallstone    known with h/o cholecystitis pain, no recent recurrence   Hypertension    no meds taken   Past Surgical History:  Procedure Laterality Date   BREAST BIOPSY Left 2017   COLONOSCOPY     MAXILLARY SINUS LIFT     MOUTH SURGERY     ORIF ANKLE FRACTURE Left 07/06/2018   Procedure: OPEN REDUCTION INTERNAL FIXATION (ORIF) left ankle trimalleolar fracture;  Surgeon: HWylene Simmer MD;  [place]  Service: Orthopedics;  Laterality: Left;  940m   TONSILLECTOMY     age [age] UPPER GASTROINTESTINAL ENDOSCOPY     Family History  Problem Relation Age of Onset   Hypertension Mother    Breast cancer Mother        Dx in her early 6067's Hypertension Father    Diabetes Father    Leukemia Father    Breast cancer Maternal Grandmother    Stomach cancer Maternal Grandmother 90   Colon cancer Maternal Uncle    Colon polyps Neg Hx    Rectal cancer Neg Hx    Esophageal cancer Neg Hx    Social History   Socioeconomic History   Marital status: Married    Spouse name: Not on file   Number of children: 0   Years of education: Not on file   Highest education level: Not on file  Occupational History  Occupation: retired  Tobacco Use   Smoking status: Former    Types: Cigarettes    Quit date: 07/15/2015    Years since quitting: 6.6   Smokeless tobacco: Never  Vaping Use   Vaping Use: Never used  Substance and Sexual Activity   Alcohol use: No    Alcohol/week: 0.0 standard drinks of alcohol    Comment: as of 04-28-16 no alcohol    Drug use: No   Sexual activity: Not Currently    Partners: Male  Other Topics Concern   Not on file  Social History Narrative   Not on file   Social Determinants of Health   Financial Resource Strain: Low Risk  (03/11/2022)   Overall Financial Resource Strain (CARDIA)    Difficulty of Paying Living  Expenses: Not hard at all  Food Insecurity: No Food Insecurity (03/11/2022)   Hunger Vital Sign    Worried About Running Out of Food in the Last Year: Never true    [place] in the Last Year: Never true  Transportation Needs: No Transportation Needs (03/11/2022)   PRAPARE - Hydrologist (Medical): No    Lack of Transportation (Non-Medical): No  Physical Activity: Sufficiently Active (03/11/2022)   Exercise Vital Sign    Days of Exercise per Week: 5 days    Minutes of Exercise per Session: 30 min  Stress: No Stress Concern Present (03/11/2022)   [place]    Feeling of Stress : Not at all  Social Connections: [place] (03/11/2022)   Social Connection and Isolation Panel [NHANES]    Frequency of Communication with Friends and Family: More than three times a week    Frequency of Social Gatherings with Friends and Family: More than three times a week    Attends Religious Services: More than 4 times per year    Active Member of [employer] or Organizations: Yes    Attends Music therapist: More than 4 times per year    Marital Status: Married    Tobacco Counseling Counseling given: Not Answered   Clinical Intake:  Pre-visit preparation completed: Yes  Pain : No/denies pain Pain Score: 0-No pain     BMI - recorded: 23.43 Nutritional Status: BMI of 19-24  Normal Nutritional Risks: None Diabetes: No  How often do you need to have someone help you when you read instructions, pamphlets, or other written materials from your doctor or pharmacy?: 1 - Never What is the last grade level you completed in school?: HSG  Diabetic? No  Interpreter Needed?: No  Information entered by :: Lisette Abu, LPN.   Activities of Daily Living    03/11/2022   10:08 AM  In your present state of health, do you have any difficulty performing the following activities:   Hearing? 0  Vision? 0  Difficulty concentrating or making decisions? 0  Walking or climbing stairs? 0  Dressing or bathing? 0  Doing errands, shopping? 0  Preparing Food and eating ? N  Using the Toilet? N  In the past six months, have you accidently leaked urine? N  Do you have problems with loss of bowel control? N  Managing your Medications? N  Managing your Finances? N  Housekeeping or managing your Housekeeping? N    Patient Care Team: Girtha Rm, NP-C as PCP - General (Family Medicine) Mauri Pole, MD as Consulting Physician (Gastroenterology) [place], P.A. as Consulting Physician (  Ophthalmology)  Indicate any recent Medical Services you may have received from other than [facility] providers in the past year (date may be approximate).     Assessment:   This is a routine wellness examination for [place].  Hearing/Vision screen Hearing Screening - Comments:: Denies hearing difficulties   Vision Screening - Comments:: Wears rx glasses - up to date with routine [facility] exams with [facility]   Dietary issues and exercise activities discussed: Current Exercise Habits: Home exercise routine, Type of exercise: walking, Time (Minutes): 30, Frequency (Times/Week): 5, Weekly Exercise (Minutes/Week): 150, Intensity: Moderate, Exercise limited by: None identified   Goals Addressed             This Visit's Progress    Patient Stated       Maintain my health and continue to be to primary caregiver for my husband, who has dementia.      Depression Screen    03/11/2022   10:08 AM 02/10/2022    1:46 PM 09/26/2019   10:05 AM 09/12/2017   10:08 AM 06/08/2017    2:56 PM 04/25/2017   10:13 AM 03/24/2017    8:24 AM  PHQ 2/9 Scores  PHQ - 2 Score 0 0 0 0 0 0 0  PHQ- 9 Score   0        Fall Risk    03/11/2022   10:05 AM 02/10/2022    1:46 PM 09/26/2019   10:05 AM 09/12/2017   10:08 AM 06/08/2017    2:56 PM  Fall Risk   Falls in the past year? 0 0 0 No No   Number falls in past yr: 0 0 0    Injury with Fall? 0 0 0    Risk for fall due to : No Fall Risks No Fall Risks     Follow up Falls prevention discussed Falls evaluation completed Falls evaluation completed      [place]:  Any stairs in or around the home? Yes  If so, are there any without handrails? No  Home free of loose throw rugs in walkways, pet beds, electrical cords, etc? Yes  Adequate lighting in your home to reduce risk of falls? Yes   ASSISTIVE DEVICES UTILIZED TO PREVENT FALLS:  Life alert? No  Use of a cane, walker or w/c? No  Grab bars in the bathroom? No  Shower chair or bench in shower? No  Elevated toilet seat or a handicapped toilet? No   TIMED UP AND GO:  Was the test performed? No . Telephonic Visit   Cognitive Function:        03/11/2022   10:08 AM 09/26/2019   10:02 AM  6CIT Screen  What Year? 0 points 0 points  What month? 0 points 0 points  What time? 0 points 0 points  Count back from 20 0 points 0 points  Months in reverse 0 points 0 points  Repeat phrase 0 points 0 points  Total Score 0 points 0 points    Immunizations Immunization History  Administered Date(s) Administered   Hepatitis A, Adult 10/21/2016, 04/25/2017   Hepatitis B, ADULT 10/21/2016, 11/22/2016, 04/25/2017   Influenza,inj,Quad PF,6+ Mos 03/18/2016, 10/11/2016, 09/12/2017   Influenza-Unspecified 11/06/2018   PFIZER(Purple Top)SARS-COV-2 Vaccination 02/24/2019, 03/20/2019   Pneumococcal Polysaccharide-23 09/22/2016   Tdap 03/18/2016    TDAP status: Up to date  Flu Vaccine status: Due, Education has been provided regarding the importance of this vaccine. Advised may receive this vaccine at  local pharmacy or Health Dept. Aware to provide a copy of the vaccination record if obtained from local pharmacy or Health Dept. Verbalized acceptance and understanding.  Pneumococcal vaccine status: Due, Education has been provided regarding the  importance of this vaccine. Advised may receive this vaccine at local pharmacy or Health Dept. Aware to provide a copy of the vaccination record if obtained from local pharmacy or Health Dept. Verbalized acceptance and understanding.  Covid-19 vaccine status: Completed vaccines  Qualifies for Shingles Vaccine? Yes   Zostavax completed No   Shingrix Completed?: No.    Education has been provided regarding the importance of this vaccine. Patient has been advised to call insurance company to determine out of pocket expense if they have not yet received this vaccine. Advised may also receive vaccine at local pharmacy or Health Dept. Verbalized acceptance and understanding.  Screening Tests Health Maintenance  Topic Date Due   Pneumonia Vaccine 34+ Years old (2 of 2 - PCV) 09/22/2017   COVID-19 Vaccine (3 - 2023-24 season) 09/18/2021   MAMMOGRAM  10/18/2021   INFLUENZA VACCINE  04/18/2022 (Originally 08/18/2021)   Zoster Vaccines- Shingrix (1 of 2) 05/12/2022 (Originally 07/03/2003)   COLONOSCOPY (Pts 45-24yr Insurance coverage will need to be confirmed)  12/11/2022   Medicare Annual Wellness (AWV)  03/12/2023   DTaP/Tdap/Td (2 - Td or Tdap) 03/19/2026   DEXA SCAN  Completed   Hepatitis C Screening  Completed   HPV VACCINES  Aged Out    Health Maintenance  Health Maintenance Due  Topic Date Due   Pneumonia Vaccine 67 Years old (2 of 2 - PCV) 09/22/2017   COVID-19 Vaccine (3 - 2023-24 season) 09/18/2021   MAMMOGRAM  10/18/2021    Colorectal cancer screening: Type of screening: Colonoscopy. Completed 12/11/2019. Repeat every 3 years  Mammogram status: Completed 10/19/2019. Repeat every year; Not able to schedule at this time.  Bone Density status: Completed 07/18/2017. Results reflect: Bone density results: OSTEOPENIA. Repeat every 2-3 years. Not able to schedule at this time.  Lung Cancer Screening: (Low Dose CT Chest recommended if Age 69-80years, 30 pack-year currently smoking OR have  quit w/in 15years.) does not qualify.   Lung Cancer Screening Referral: no  Additional Screening:  Hepatitis C Screening: does qualify; Completed 04/04/2017  Vision Screening: Recommended annual ophthalmology exams for early detection of glaucoma and other disorders of the eye. Is the patient up to date with their annual eye exam?  Yes  Who is the provider or what is the name of the office in which the patient attends annual [facility] exams[facility] If pt is not established with a provider, would they like to be referred to a provider to establish care? No .   Dental Screening: Recommended annual dental exams for proper oral hygiene  Community Resource Referral / Chronic Care Management: CRR required this visit?  No   CCM required this visit?  No      Plan:     I have personally reviewed and noted the following in the patient's chart:   Medical and social history Use of alcohol, tobacco or illicit drugs  Current medications and supplements including opioid prescriptions. Patient is not currently taking opioid prescriptions. Functional ability and status Nutritional status Physical activity Advanced directives List of other physicians Hospitalizations, surgeries, and ER visits in previous 12 months Vitals Screenings to include cognitive, depression, and falls Referrals and appointments  In addition, I have reviewed and discussed with patient certain preventive protocols, quality metrics,  and best practice recommendations. A written personalized care plan for preventive services as well as general preventive health recommendations were provided to patient.     Sheral Flow, LPN   [id number]   Nurse Notes:  Normal cognitive status assessed by direct observation by this Nurse Health Advisor. No abnormalities found.   Patient not able to complete mammogram or bone density due to being primary caregiver for husband, who has severe dementia.

## 2022-03-12 ENCOUNTER — Ambulatory Visit: Payer: Medicare Other | Admitting: Family Medicine

## 2022-03-18 ENCOUNTER — Encounter: Payer: Self-pay | Admitting: Family Medicine

## 2022-03-18 ENCOUNTER — Ambulatory Visit (INDEPENDENT_AMBULATORY_CARE_PROVIDER_SITE_OTHER): Payer: Medicare Other | Admitting: Family Medicine

## 2022-03-18 VITALS — BP 134/84 | HR 85 | Temp 97.8°F | Ht 61.0 in | Wt 126.0 lb

## 2022-03-18 DIAGNOSIS — I10 Essential (primary) hypertension: Secondary | ICD-10-CM

## 2022-03-18 DIAGNOSIS — I700 Atherosclerosis of aorta: Secondary | ICD-10-CM | POA: Diagnosis not present

## 2022-03-18 DIAGNOSIS — Z1231 Encounter for screening mammogram for malignant neoplasm of breast: Secondary | ICD-10-CM | POA: Diagnosis not present

## 2022-03-18 DIAGNOSIS — Z23 Encounter for immunization: Secondary | ICD-10-CM | POA: Diagnosis not present

## 2022-03-18 DIAGNOSIS — E7800 Pure hypercholesterolemia, unspecified: Secondary | ICD-10-CM

## 2022-03-18 DIAGNOSIS — E2839 Other primary ovarian failure: Secondary | ICD-10-CM

## 2022-03-18 DIAGNOSIS — M8580 Other specified disorders of bone density and structure, unspecified site: Secondary | ICD-10-CM

## 2022-03-18 DIAGNOSIS — Z780 Asymptomatic menopausal state: Secondary | ICD-10-CM

## 2022-03-18 DIAGNOSIS — J439 Emphysema, unspecified: Secondary | ICD-10-CM

## 2022-03-18 DIAGNOSIS — E559 Vitamin D deficiency, unspecified: Secondary | ICD-10-CM | POA: Diagnosis not present

## 2022-03-18 LAB — CBC WITH DIFFERENTIAL/PLATELET
Basophils Absolute: 0.1 10*3/uL (ref 0.0–0.1)
Basophils Relative: 0.9 % (ref 0.0–3.0)
Eosinophils Absolute: 0.2 10*3/uL (ref 0.0–0.7)
Eosinophils Relative: 2.1 % (ref 0.0–5.0)
HCT: 41.8 % (ref 36.0–46.0)
Hemoglobin: 13.9 g/dL (ref 12.0–15.0)
Lymphocytes Relative: 24.6 % (ref 12.0–46.0)
Lymphs Abs: 2.4 10*3/uL (ref 0.7–4.0)
MCHC: 33.3 g/dL (ref 30.0–36.0)
MCV: 91.1 fl (ref 78.0–100.0)
Monocytes Absolute: 0.8 10*3/uL (ref 0.1–1.0)
Monocytes Relative: 8.3 % (ref 3.0–12.0)
Neutro Abs: 6.3 10*3/uL (ref 1.4–7.7)
Neutrophils Relative %: 64.1 % (ref 43.0–77.0)
Platelets: 292 10*3/uL (ref 150.0–400.0)
RBC: 4.58 Mil/uL (ref 3.87–5.11)
RDW: 13.5 % (ref 11.5–15.5)
WBC: 9.9 10*3/uL (ref 4.0–10.5)

## 2022-03-18 LAB — LIPID PANEL
Cholesterol: 180 mg/dL (ref 0–200)
HDL: 61.6 mg/dL (ref >39.00)
LDL Cholesterol: 102 mg/dL — ABNORMAL HIGH (ref 0–99)
NonHDL: 118.03
Total CHOL/HDL Ratio: 3
Triglycerides: 81 mg/dL (ref 0.0–149.0)
VLDL: 16.2 mg/dL (ref 0.0–40.0)

## 2022-03-18 LAB — COMPREHENSIVE METABOLIC PANEL
ALT: 12 U/L (ref 0–35)
AST: 17 U/L (ref 0–37)
Albumin: 4.3 g/dL (ref 3.5–5.2)
Alkaline Phosphatase: 49 U/L (ref 39–117)
BUN: 12 mg/dL (ref 6–23)
CO2: 29 mEq/L (ref 19–32)
Calcium: 10.1 mg/dL (ref 8.4–10.5)
Chloride: 101 mEq/L (ref 96–112)
Creatinine, Ser: 0.49 mg/dL (ref 0.40–1.20)
GFR: 96.65 mL/min (ref >60.00)
Glucose, Bld: 89 mg/dL (ref 70–99)
Potassium: 5.1 mEq/L (ref 3.5–5.1)
Sodium: 138 mEq/L (ref 135–145)
Total Bilirubin: 0.6 mg/dL (ref 0.2–1.2)
Total Protein: 7.3 g/dL (ref 6.0–8.3)

## 2022-03-18 LAB — VITAMIN D 25 HYDROXY (VIT D DEFICIENCY, FRACTURES): VITD: 16.61 ng/mL — ABNORMAL LOW (ref 30.00–100.00)

## 2022-03-18 MED ORDER — ATORVASTATIN CALCIUM 10 MG PO TABS: 10.0000 mg | ORAL_TABLET | Freq: Every day | ORAL | 3 refills | Status: DC

## 2022-03-18 MED ORDER — VITAMIN D (ERGOCALCIFEROL) 1.25 MG (50000 UNIT) PO CAPS: 50000.0000 [IU] | ORAL_CAPSULE | ORAL | 2 refills | Status: DC

## 2022-03-18 NOTE — Progress Notes (Signed)
Please see my note to her and schedule her for a 6 to 8-week follow-up with me and for fasting labs.

## 2022-03-18 NOTE — Assessment & Plan Note (Signed)
Check lipid panel.  She is amenable to starting on statin therapy. Counseling on low-fat, low-cholesterol diet and exercise.

## 2022-03-18 NOTE — Assessment & Plan Note (Signed)
Blood pressure diet controlled.  She will continue to monitor BP at home.  Check BMP

## 2022-03-18 NOTE — Assessment & Plan Note (Signed)
She will call and schedule bone density.  Counseling on getting adequate calcium and vitamin D.  Will check vitamin D level.  She is getting weightbearing exercise.  Follow-up pending bone density results.

## 2022-03-18 NOTE — Assessment & Plan Note (Signed)
Vitamin D prescription sent to pharmacy to take once weekly x 12 weeks.  Once completed, she will take over-the-counter vitamin D3 2000 IUs daily.

## 2022-03-18 NOTE — Patient Instructions (Signed)
Please go downstairs for labs before you leave.   You can call and schedule with [facility] for gallstones.  [phone]  You may call and schedule your mammogram and bone density at the breast center as discussed.  We will be in touch with your lab results and recommendations.

## 2022-03-18 NOTE — Progress Notes (Signed)
Subjective:     Patient ID: Brittany Summers, female    DOB: 26-Aug-1955, 67 y.o.   MRN: [id number]  Chief Complaint  Patient presents with   Hypertension    4 week f/u; has checked at home for past month; range [id number] systolic and [id number] diastolic; has BP log with her to review    HPI Patient is in today for 4 wk follow up on HTN.   Her readings have been consistently in the 130s over 70s. Diet and exercise controlled hypertension.  She has been on diuretics in the past.  States she is due for mammogram and bone density. Hx of osteopenia.   Denies fever, chills, dizziness, chest pain, palpitations, shortness of breath, abdominal pain, N/V/D, urinary symptoms, LE edema.    Former smoker- stopped more than 5 years ago  COPD   CC[facility] referral done for gallstones     Health Maintenance Due  Topic Date Due   MAMMOGRAM  10/18/2021    Past Medical History:  Diagnosis Date   Chronic hepatitis C without hepatic coma ([place]) 09/22/2016   finished treatment for this   Gallstone    known with h/o cholecystitis pain, no recent recurrence   Hypertension    no meds taken    Past Surgical History:  Procedure Laterality Date   BREAST BIOPSY Left 2017   COLONOSCOPY     MAXILLARY SINUS LIFT     MOUTH SURGERY     ORIF ANKLE FRACTURE Left 07/06/2018   Procedure: OPEN REDUCTION INTERNAL FIXATION (ORIF) left ankle trimalleolar fracture;  Surgeon: Wylene Simmer, MD;  Location: [place];  Service: Orthopedics;  Laterality: Left;  84mn   TONSILLECTOMY     age 89  UPPER GASTROINTESTINAL ENDOSCOPY      Family History  Problem Relation Age of Onset   Hypertension Mother    Breast cancer Mother        Dx in her early 636's  Hypertension Father    Diabetes Father    Leukemia Father    Breast cancer Maternal Grandmother    Stomach cancer Maternal Grandmother 90   Colon cancer Maternal Uncle    Colon polyps Neg Hx    Rectal cancer Neg Hx    Esophageal cancer Neg Hx      Social History   Socioeconomic History   Marital status: Married    Spouse name: Not on file   Number of children: 0   Years of education: Not on file   Highest education level: Not on file  Occupational History   Occupation: retired  Tobacco Use   Smoking status: Former    Types: Cigarettes    Quit date: 07/15/2015    Years since quitting: 6.6   Smokeless tobacco: Never  Vaping Use   Vaping Use: Never used  Substance and Sexual Activity   Alcohol use: No    Alcohol/week: 0.0 standard drinks of alcohol    Comment: as of 04-28-16 no alcohol    Drug use: No   Sexual activity: Not Currently    Partners: Male  Other Topics Concern   Not on file  Social History Narrative   Not on file   Social Determinants of Health   Financial Resource Strain: Low Risk  (03/11/2022)   Overall Financial Resource Strain (CARDIA)    Difficulty of Paying Living Expenses: Not hard at all  Food Insecurity: No Food Insecurity (03/11/2022)   Hunger Vital Sign    Worried About  Running Out of Food in the Last Year: Never true    [place] in the Last Year: Never true  Transportation Needs: No Transportation Needs (03/11/2022)   PRAPARE - Hydrologist (Medical): No    Lack of Transportation (Non-Medical): No  Physical Activity: Sufficiently Active (03/11/2022)   Exercise Vital Sign    Days of Exercise per Week: 5 days    Minutes of Exercise per Session: 30 min  Stress: No Stress Concern Present (03/11/2022)   [place]    Feeling of Stress : Not at all  Social Connections: [place] (03/11/2022)   Social Connection and Isolation Panel [NHANES]    Frequency of Communication with Friends and Family: More than three times a week    Frequency of Social Gatherings with Friends and Family: More than three times a week    Attends Religious Services: More than 4 times per year    Active Member  of [employer] or Organizations: Yes    Attends Archivist Meetings: More than 4 times per year    Marital Status: Married  Human resources officer Violence: Not At Risk (03/11/2022)   Humiliation, Afraid, Rape, and Kick questionnaire    Fear of Current or Ex-Partner: No    Emotionally Abused: No    Physically Abused: No    Sexually Abused: No    No outpatient medications prior to visit.   No facility-administered medications prior to visit.    Allergies  Allergen Reactions   Penicillins     Has patient had a PCN reaction causing immediate rash, facial/tongue/throat swelling, SOB or lightheadedness with hypotension: Y Has patient had a PCN reaction causing severe rash involving mucus membranes or skin necrosis: Y Has patient had a PCN reaction that required hospitalization: N Has patient had a PCN reaction occurring within the last 10 years: Y If all of the above answers are "NO", then may proceed with Cephalosporin use.     ROS     Objective:    Physical Exam Constitutional:      General: She is not in acute distress.    Appearance: She is not ill-appearing.  Cardiovascular:     Rate and Rhythm: Normal rate.  Pulmonary:     Effort: Pulmonary effort is normal.  Neurological:     General: No focal deficit present.     Mental Status: She is alert and oriented to person, place, and time.  Psychiatric:        Mood and Affect: Mood normal.        Behavior: Behavior normal.        Thought Content: Thought content normal.     BP 134/84 (BP Location: Left Arm, Patient Position: Sitting, Cuff Size: Normal)   Pulse 85   Temp 97.8 F (36.6 C) (Temporal)   Ht '5\' 1"'$  (1.549 m)   Wt 126 lb (57.2 kg)   LMP  (LMP Unknown)   SpO2 99%   BMI 23.81 kg/m  Wt Readings from Last 3 Encounters:  03/18/22 126 lb (57.2 kg)  03/11/22 124 lb (56.2 kg)  02/10/22 127 lb (57.6 kg)       Assessment & Plan:   Problem List Items Addressed This Visit       Cardiovascular and  Mediastinum   Aortic atherosclerosis ([place])    Check lipid panel.  She is amenable to starting on statin therapy. Counseling on low-fat, low-cholesterol diet and  exercise.      Relevant Medications   atorvastatin (LIPITOR) 10 MG tablet   Other Relevant Orders   Lipid panel (Completed)   Hypertension - Primary    Blood pressure diet controlled.  She will continue to monitor BP at home.  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  Counseling on getting adequate calcium and vitamin D.  Will check vitamin D level.  She is getting weightbearing exercise.  Follow-up pending bone density results.      Relevant Orders   DG Bone Density     Other   Pure hypercholesterolemia    Atorvastatin 10 mg daily prescribed due to elevated LDL and history of aortic atherosclerosis. recommend fasting follow-up in 6 to 8 weeks      Relevant Medications   atorvastatin (LIPITOR) 10 MG tablet   Vitamin D deficiency    Vitamin D prescription sent to pharmacy to take once weekly x 12 weeks.  Once completed, she will take over-the-counter vitamin D3 2000 IUs daily.      Relevant Medications   Vitamin D, Ergocalciferol, (DRISDOL) 1.25 MG (50000 UNIT) CAPS capsule   Other Relevant Orders   VITAMIN D 25 Hydroxy (Vit-D Deficiency, Fractures) (Completed)   Other Visit Diagnoses     Encounter for screening mammogram for malignant neoplasm of breast       Relevant Orders   MM 3D SCREEN BREAST BILATERAL   Estrogen deficiency       Relevant Orders   DG Bone Density   Pulmonary emphysema, unspecified emphysema type ([place])       Need for pneumococcal 20-valent conjugate vaccination       Relevant Orders   Pneumococcal conjugate vaccine 20-valent  (Prevnar 20) (Completed)       I am having Mechele Claude. [place]hol[place] start on Vitamin D (Ergocalciferol) and atorvastatin.  Meds ordered this encounter  Medications   Vitamin D, Ergocalciferol, (DRISDOL) 1.25 MG (50000 UNIT) CAPS capsule    Sig: Take 1 capsule (50,000 Units total) by mouth every 7 (seven) days.    Dispense:  4 capsule    Refill:  2    Order Specific Question:   Supervising Provider    Answer:   Pricilla Holm A [[id number]   atorvastatin (LIPITOR) 10 MG tablet    Sig: Take 1 tablet (10 mg total) by mouth daily.    Dispense:  90 tablet    Refill:  3    Order Specific Question:   Supervising Provider    Answer:   Pricilla Holm A [id number]

## 2022-03-18 NOTE — Assessment & Plan Note (Signed)
Atorvastatin 10 mg daily prescribed due to elevated LDL and history of aortic atherosclerosis. recommend fasting follow-up in 6 to 8 weeks

## 2022-05-06 ENCOUNTER — Encounter: Payer: Self-pay | Admitting: Family Medicine

## 2022-05-06 ENCOUNTER — Ambulatory Visit (INDEPENDENT_AMBULATORY_CARE_PROVIDER_SITE_OTHER): Payer: Medicare Other | Admitting: Family Medicine

## 2022-05-06 VITALS — BP 126/84 | HR 87 | Temp 97.8°F | Ht 61.0 in | Wt 129.0 lb

## 2022-05-06 DIAGNOSIS — E7800 Pure hypercholesterolemia, unspecified: Secondary | ICD-10-CM | POA: Diagnosis not present

## 2022-05-06 DIAGNOSIS — E559 Vitamin D deficiency, unspecified: Secondary | ICD-10-CM | POA: Diagnosis not present

## 2022-05-06 DIAGNOSIS — I10 Essential (primary) hypertension: Secondary | ICD-10-CM | POA: Diagnosis not present

## 2022-05-06 DIAGNOSIS — J439 Emphysema, unspecified: Secondary | ICD-10-CM

## 2022-05-06 DIAGNOSIS — I700 Atherosclerosis of aorta: Secondary | ICD-10-CM

## 2022-05-06 DIAGNOSIS — Z87891 Personal history of nicotine dependence: Secondary | ICD-10-CM

## 2022-05-06 LAB — LIPID PANEL
Cholesterol: 132 mg/dL (ref 0–200)
HDL: 65.1 mg/dL (ref >39.00)
LDL Cholesterol: 52 mg/dL (ref 0–99)
NonHDL: 66.42
Total CHOL/HDL Ratio: 2
Triglycerides: 71 mg/dL (ref 0.0–149.0)
VLDL: 14.2 mg/dL (ref 0.0–40.0)

## 2022-05-06 LAB — COMPREHENSIVE METABOLIC PANEL
ALT: 15 U/L (ref 0–35)
AST: 17 U/L (ref 0–37)
Albumin: 4.3 g/dL (ref 3.5–5.2)
Alkaline Phosphatase: 49 U/L (ref 39–117)
BUN: 11 mg/dL (ref 6–23)
CO2: 29 mEq/L (ref 19–32)
Calcium: 9.5 mg/dL (ref 8.4–10.5)
Chloride: 103 mEq/L (ref 96–112)
Creatinine, Ser: 0.51 mg/dL (ref 0.40–1.20)
GFR: 95.63 mL/min (ref >60.00)
Glucose, Bld: 97 mg/dL (ref 70–99)
Potassium: 4.5 mEq/L (ref 3.5–5.1)
Sodium: 140 mEq/L (ref 135–145)
Total Bilirubin: 0.6 mg/dL (ref 0.2–1.2)
Total Protein: 7 g/dL (ref 6.0–8.3)

## 2022-05-06 LAB — VITAMIN D 25 HYDROXY (VIT D DEFICIENCY, FRACTURES): VITD: 38.83 ng/mL (ref 30.00–100.00)

## 2022-05-06 NOTE — Progress Notes (Signed)
Subjective:     Patient ID: Brittany Summers, female    DOB: 15-Apr-1955, 67 y.o.   MRN: [id number]  Chief Complaint  Patient presents with   Hyperlipidemia    Fasting f/u recheck labs    Hyperlipidemia Pertinent negatives include no chest pain or shortness of breath.   Patient is in today for follow up on HL, aortic atherosclerosis and starting on statin therapy. Doing well. No concerns.  Vitamin D def and taking prescription vitamin D weekly.   Smoking hx with 20 or more pack years and stopped smoking 6 years ago.  She has not had a low dose CT since 2020.  Currently asymptomatic.     Health Maintenance Due  Topic Date Due   Lung Cancer Screening  09/05/2019   COVID-19 Vaccine (3 - 2023-24 season) 09/18/2021   MAMMOGRAM  10/18/2021    Past Medical History:  Diagnosis Date   Chronic hepatitis C without hepatic coma 09/22/2016   finished treatment for this   Gallstone    known with h/o cholecystitis pain, no recent recurrence   Hypertension    no meds taken    Past Surgical History:  Procedure Laterality Date   BREAST BIOPSY Left 2017   COLONOSCOPY     MAXILLARY SINUS LIFT     MOUTH SURGERY     ORIF ANKLE FRACTURE Left 07/06/2018   Procedure: OPEN REDUCTION INTERNAL FIXATION (ORIF) left ankle trimalleolar fracture;  Surgeon: Toni Arthurs, MD;  Location: [facility];  Service: Orthopedics;  Laterality: Left;    TONSILLECTOMY     age 66   UPPER GASTROINTESTINAL ENDOSCOPY      Family History  Problem Relation Age of Onset   Hypertension Mother    Breast cancer Mother        Dx in her early 60's   Hypertension Father    Diabetes Father    Leukemia Father    Breast cancer Maternal Grandmother    Stomach cancer Maternal Grandmother 28   Colon cancer Maternal Uncle    Colon polyps Neg Hx    Rectal cancer Neg Hx    Esophageal cancer Neg Hx     Social History   Socioeconomic History   Marital status: Married    Spouse name: Not on  file   Number of children: 0   Years of education: Not on file   Highest education level: Not on file  Occupational History   Occupation: retired  Tobacco Use   Smoking status: Former    Types: Cigarettes    Quit date: 07/15/2015    Years since quitting: 6.8   Smokeless tobacco: Never  Vaping Use   Vaping Use: Never used  Substance and Sexual Activity   Alcohol use: No    Alcohol/week: 0.0 standard drinks of alcohol    Comment: as of 04-28-16 no alcohol    Drug use: No   Sexual activity: Not Currently    Partners: Male  Other Topics Concern   Not on file  Social History Narrative   Not on file   Social Determinants of Health   Financial Resource Strain: Low Risk  (03/11/2022)   Overall Financial Resource Strain (CARDIA)    Difficulty of Paying Living Expenses: Not hard at all  Food Insecurity: No Food Insecurity (03/11/2022)   Hunger Vital Sign    Worried About Running Out of Food in the Last Year: Never true    Ran Out of Food in the Last Year:  Never true  Transportation Needs: No Transportation Needs (03/11/2022)   PRAPARE - Administrator, Civil Service (Medical): No    Lack of Transportation (Non-Medical): No  Physical Activity: Sufficiently Active (03/11/2022)   Exercise Vital Sign    Days of Exercise per Week: 5 days    Minutes of Exercise per Session: 30 min  Stress: No Stress Concern Present (03/11/2022)   [employer] of Occupational Health - Occupational Stress Questionnaire    Feeling of Stress : Not at all  Social Connections: Socially Integrated (03/11/2022)   Social Connection and Isolation Panel [NHANES]    Frequency of Communication with Friends and Family: More than three times a week    Frequency of Social Gatherings with Friends and Family: More than three times a week    Attends Religious Services: More than 4 times per year    Active Member of [employer] or Organizations: Yes    Attends Engineer, structural: More than 4 times per  year    Marital Status: Married  Catering manager Violence: Not At Risk (03/11/2022)   Humiliation, Afraid, Rape, and Kick questionnaire    Fear of Current or Ex-Partner: No    Emotionally Abused: No    Physically Abused: No    Sexually Abused: No    Outpatient Medications Prior to Visit  Medication Sig Dispense Refill   atorvastatin (LIPITOR) 10 MG tablet Take 1 tablet (10 mg total) by mouth daily. 90 tablet 3   Vitamin D, Ergocalciferol, (DRISDOL) 1.25 MG (50000 UNIT) CAPS capsule Take 1 capsule (50,000 Units total) by mouth every 7 (seven) days. 4 capsule 2   No facility-administered medications prior to visit.    Allergies  Allergen Reactions   Penicillins     Has patient had a PCN reaction causing immediate rash, facial/tongue/throat swelling, SOB or lightheadedness with hypotension: Y Has patient had a PCN reaction causing severe rash involving mucus membranes or skin necrosis: Y Has patient had a PCN reaction that required hospitalization: N Has patient had a PCN reaction occurring within the last 10 years: Y If all of the above answers are "NO", then may proceed with Cephalosporin use.     Review of Systems  Constitutional:  Negative for chills, fever and weight loss.  Respiratory:  Negative for cough, shortness of breath and wheezing.   Cardiovascular:  Negative for chest pain, palpitations and leg swelling.  Gastrointestinal:  Negative for abdominal pain, constipation, diarrhea, nausea and vomiting.  Genitourinary:  Negative for dysuria, frequency and urgency.  Neurological:  Negative for dizziness.       Objective:    Physical Exam Constitutional:      General: She is not in acute distress.    Appearance: She is not ill-appearing.  Eyes:     Extraocular Movements: Extraocular movements intact.     Conjunctiva/sclera: Conjunctivae normal.  Cardiovascular:     Rate and Rhythm: Normal rate.  Pulmonary:     Effort: Pulmonary effort is normal.   Musculoskeletal:     Cervical back: Normal range of motion and neck supple.  Skin:    General: Skin is warm and dry.  Neurological:     General: No focal deficit present.     Mental Status: She is alert and oriented to person, place, and time.  Psychiatric:        Mood and Affect: Mood normal.        Behavior: Behavior normal.        Thought Content:  Thought content normal.     BP 126/84 (BP Location: Left Arm, Patient Position: Sitting, Cuff Size: Normal)   Pulse 87   Temp 97.8 F (36.6 C) (Temporal)   Ht  (1.549 m)   Wt 129 lb (58.5 kg)   LMP  (LMP Unknown)   SpO2 99%   BMI 24.37 kg/m  Wt Readings from Last 3 Encounters:  05/06/22 129 lb (58.5 kg)  03/18/22 126 lb (57.2 kg)  03/11/22 124 lb (56.2 kg)       Assessment & Plan:   Problem List Items Addressed This Visit       Cardiovascular and Mediastinum   Aortic atherosclerosis    On statin therapy       Relevant Orders   Lipid panel   Comprehensive metabolic panel   Hypertension   Relevant Orders   Comprehensive metabolic panel     Respiratory   Pulmonary emphysema    Asymptomatic. Referred to pulmonology for low dose CT screening      Relevant Orders   Ambulatory Referral for Lung Cancer Scre     Other   Pure hypercholesterolemia - Primary    Continue atorvastatin 10 mg daily due to elevated LDL and history of aortic atherosclerosis. Check fasting lipids today and follow up.       Relevant Orders   Lipid panel   Comprehensive metabolic panel   Stopped smoking with greater than 20 pack year history    Last low dose CT done in 2020. Recommended follow up in 12 months. She was lost to follow up. Referred to pulmonology.       Relevant Orders   Ambulatory Referral for Lung Cancer Scre   Vitamin D deficiency    On prescription vitamin D. Recheck today. Discussed importance of vitamin D due to osteopenia.       Relevant Orders   VITAMIN D 25 Hydroxy (Vit-D Deficiency, Fractures)     I  am having Tawanna Solo. Kiel maintain her Vitamin D (Ergocalciferol) and atorvastatin.  No orders of the defined types were placed in this encounter.

## 2022-05-06 NOTE — Assessment & Plan Note (Signed)
On statin therapy 

## 2022-05-06 NOTE — Assessment & Plan Note (Signed)
Continue atorvastatin 10 mg daily due to elevated LDL and history of aortic atherosclerosis. Check fasting lipids today and follow up.

## 2022-05-06 NOTE — Assessment & Plan Note (Signed)
On prescription vitamin D. Recheck today. Discussed importance of vitamin D due to osteopenia.

## 2022-05-06 NOTE — Assessment & Plan Note (Signed)
Asymptomatic. Referred to pulmonology for low dose CT screening

## 2022-05-06 NOTE — Assessment & Plan Note (Signed)
Last low dose CT done in 2020. Recommended follow up in 12 months. She was lost to follow up. Referred to pulmonology.

## 2022-05-06 NOTE — Patient Instructions (Signed)
You will hear from [facility]monology to schedule your low dose CT for lung cancer screening.

## 2022-06-08 ENCOUNTER — Other Ambulatory Visit: Payer: Self-pay | Admitting: Family Medicine

## 2022-06-08 DIAGNOSIS — E559 Vitamin D deficiency, unspecified: Secondary | ICD-10-CM

## 2022-06-23 ENCOUNTER — Other Ambulatory Visit: Payer: Self-pay | Admitting: General Surgery

## 2022-06-23 DIAGNOSIS — K8020 Calculus of gallbladder without cholecystitis without obstruction: Secondary | ICD-10-CM

## 2022-06-23 MED ORDER — INDOCYANINE GREEN 25 MG IV SOLR: 25.0000 mg | Freq: Once | INTRAVENOUS | Status: AC

## 2022-07-27 NOTE — Pre-Procedure Instructions (Signed)
Surgical Instructions    Your procedure is scheduled on August 04, 2022.  Report to [facility] Main Entrance "A" at 7:00 A.M., then check in with the Admitting office.  Call this number if you have problems the morning of surgery:  [phone]  If you have any questions prior to your surgery date call [phone]: Open Monday-Friday 8am-4pm If you experience any cold or flu symptoms such as cough, fever, chills, shortness of breath, etc. between now and your scheduled surgery, please notify us at the above number.     Remember:  Do not eat after midnight the night before your surgery  You may drink clear liquids until 6:00 AM the morning of your surgery.   Clear liquids allowed are: Water, Non-Citrus Juices (without pulp), Carbonated Beverages, Clear Tea, Black Coffee Only (NO MILK, CREAM OR POWDERED CREAMER of any kind), and Gatorade.  Patient Instructions  The night before surgery:  No food after midnight. ONLY clear liquids after midnight  The day of surgery (if you do NOT have diabetes):  Drink ONE (1) Pre-Surgery Clear Ensure by 6:00 AM the morning of surgery. Drink in one sitting. Do not sip.  This drink was given to you during your hospital  pre-op appointment visit.  Nothing else to drink after completing the  Pre-Surgery Clear Ensure.         If you have questions, please contact your surgeon's office.     Take these medicines the morning of surgery with A SIP OF WATER:  atorvastatin (LIPITOR)    As of today, STOP taking any Aspirin (unless otherwise instructed by your surgeon) Aleve, Naproxen, Ibuprofen, Motrin, Advil, Goody's, BC's, all herbal medications, fish oil, and all vitamins.                     Do NOT Smoke (Tobacco/Vaping) for 24 hours prior to your procedure.  If you use a CPAP at night, you may bring your mask/headgear for your overnight stay.   Contacts, glasses, piercing's, hearing aid's, dentures or partials may not be worn into surgery, please  bring cases for these belongings.    For patients admitted to the hospital, discharge time will be determined by your treatment team.   Patients discharged the day of surgery will not be allowed to drive home, and someone needs to stay with them for 24 hours.  SURGICAL WAITING ROOM VISITATION Patients having surgery or a procedure may have no more than 2 support people in the waiting area - these visitors may rotate.   Children under the age of 60 must have an adult with them who is not the patient. If the patient needs to stay at the hospital during part of their recovery, the visitor guidelines for inpatient rooms apply. Pre-op nurse will coordinate an appropriate time for 1 support person to accompany patient in pre-op.  This support person may not rotate.   Please refer to the [facility] website for the visitor guidelines for Inpatients (after your surgery is over and you are in a regular room).    Special instructions:   [facility]- Preparing For Surgery  Before surgery, you can play an important role. Because skin is not sterile, your skin needs to be as free of germs as possible. You can reduce the number of germs on your skin by washing with CHG (chlorahexidine gluconate) Soap before surgery.  CHG is an antiseptic cleaner which kills germs and bonds with the skin to continue killing germs even after  washing.    Oral Hygiene is also important to reduce your risk of infection.  Remember - BRUSH YOUR TEETH THE MORNING OF SURGERY WITH YOUR REGULAR TOOTHPASTE  Please do not use if you have an allergy to CHG or antibacterial soaps. If your skin becomes reddened/irritated stop using the CHG.  Do not shave (including legs and underarms) for at least 48 hours prior to first CHG shower. It is OK to shave your face.  Please follow these instructions carefully.   Shower the NIGHT BEFORE SURGERY and the MORNING OF SURGERY  If you chose to wash your hair, wash your hair first as usual with  your normal shampoo.  After you shampoo, rinse your hair and body thoroughly to remove the shampoo.  Use CHG Soap as you would any other liquid soap. You can apply CHG directly to the skin and wash gently with a scrungie or a clean washcloth.   Apply the CHG Soap to your body ONLY FROM THE NECK DOWN.  Do not use on open wounds or open sores. Avoid contact with your eyes, ears, mouth and genitals (private parts). Wash Face and genitals (private parts)  with your normal soap.   Wash thoroughly, paying special attention to the area where your surgery will be performed.  Thoroughly rinse your body with warm water from the neck down.  DO NOT shower/wash with your normal soap after using and rinsing off the CHG Soap.  Pat yourself dry with a CLEAN TOWEL.  Wear CLEAN PAJAMAS to bed the night before surgery  Place CLEAN SHEETS on your bed the night before your surgery  DO NOT SLEEP WITH PETS.   Day of Surgery: Take a shower with CHG soap. Do not wear jewelry or makeup Do not wear lotions, powders, perfumes/colognes, or deodorant. Do not shave 48 hours prior to surgery.  Men may shave face and neck. Do not bring valuables to the hospital.  [facility] is not responsible for any belongings or valuables. Do not wear nail polish, gel polish, artificial nails, or any other type of covering on natural nails (fingers and toes) If you have artificial nails or gel coating that need to be removed by a nail salon, please have this removed prior to surgery. Artificial nails or gel coating may interfere with anesthesia's ability to adequately monitor your vital signs.  Wear Clean/Comfortable clothing the morning of surgery Remember to brush your teeth WITH YOUR REGULAR TOOTHPASTE.   Please read over the following fact sheets that you were given.    If you received a COVID test during your pre-op visit  it is requested that you wear a mask when out in public, stay away from anyone that may not be  feeling well and notify your surgeon if you develop symptoms. If you have been in contact with anyone that has tested positive in the last 10 days please notify you surgeon.

## 2022-07-28 ENCOUNTER — Other Ambulatory Visit: Payer: Self-pay

## 2022-07-28 ENCOUNTER — Encounter (HOSPITAL_COMMUNITY)
Admission: RE | Admit: 2022-07-28 | Discharge: 2022-07-28 | Disposition: A | Discharge status: Home / Self Care | Payer: Medicare Other | Source: Ambulatory Visit | Attending: General Surgery | Admitting: General Surgery

## 2022-07-28 ENCOUNTER — Encounter (HOSPITAL_COMMUNITY): Payer: Self-pay

## 2022-07-28 VITALS — BP 176/85 | HR 85 | Temp 98.4°F | Resp 17 | Ht 61.0 in | Wt 126.6 lb

## 2022-07-28 DIAGNOSIS — I2510 Atherosclerotic heart disease of native coronary artery without angina pectoris: Secondary | ICD-10-CM | POA: Insufficient documentation

## 2022-07-28 DIAGNOSIS — K8020 Calculus of gallbladder without cholecystitis without obstruction: Secondary | ICD-10-CM | POA: Diagnosis not present

## 2022-07-28 DIAGNOSIS — Z01818 Encounter for other preprocedural examination: Secondary | ICD-10-CM | POA: Insufficient documentation

## 2022-07-28 LAB — CBC WITH DIFFERENTIAL/PLATELET
Abs Immature Granulocytes: 0.03 10*3/uL (ref 0.00–0.07)
Basophils Absolute: 0.1 10*3/uL (ref 0.0–0.1)
Basophils Relative: 1 %
Eosinophils Absolute: 0.2 10*3/uL (ref 0.0–0.5)
Eosinophils Relative: 2 %
HCT: 37.7 % (ref 36.0–46.0)
Hemoglobin: 12.2 g/dL (ref 12.0–15.0)
Immature Granulocytes: 0 %
Lymphocytes Relative: 27 %
Lymphs Abs: 2.7 10*3/uL (ref 0.7–4.0)
MCH: 29.8 pg (ref 26.0–34.0)
MCHC: 32.4 g/dL (ref 30.0–36.0)
MCV: 92 fL (ref 80.0–100.0)
Monocytes Absolute: 0.9 10*3/uL (ref 0.1–1.0)
Monocytes Relative: 9 %
Neutro Abs: 6.3 10*3/uL (ref 1.7–7.7)
Neutrophils Relative %: 61 %
Platelets: 268 10*3/uL (ref 150–400)
RBC: 4.1 MIL/uL (ref 3.87–5.11)
RDW: 13 % (ref 11.5–15.5)
WBC: 10.1 10*3/uL (ref 4.0–10.5)
nRBC: 0 % (ref 0.0–0.2)

## 2022-07-28 LAB — COMPREHENSIVE METABOLIC PANEL
ALT: 20 U/L (ref 0–44)
AST: 19 U/L (ref 15–41)
Albumin: 4.2 g/dL (ref 3.5–5.0)
Alkaline Phosphatase: 49 U/L (ref 38–126)
Anion gap: 12 (ref 5–15)
BUN: 10 mg/dL (ref 8–23)
CO2: 28 mmol/L (ref 22–32)
Calcium: 9.6 mg/dL (ref 8.9–10.3)
Chloride: 98 mmol/L (ref 98–111)
Creatinine, Ser: 0.6 mg/dL (ref 0.44–1.00)
GFR, Estimated: >60 mL/min (ref >60)
Glucose, Bld: 106 mg/dL — ABNORMAL HIGH (ref 70–99)
Potassium: 4.3 mmol/L (ref 3.5–5.1)
Sodium: 138 mmol/L (ref 135–145)
Total Bilirubin: 0.8 mg/dL (ref 0.3–1.2)
Total Protein: 7 g/dL (ref 6.5–8.1)

## 2022-07-28 LAB — LIPASE, BLOOD: Lipase: 34 U/L (ref 11–51)

## 2022-07-28 NOTE — Progress Notes (Addendum)
PCP - Vickie L. Suezanne Jacquet, NP-C Cardiologist - Denies  PPM/ICD - Denies Device Orders - n/a Rep Notified - n/a  Chest x-ray - Denies EKG - 07/28/2022 Stress Test - Denies ECHO - Denies Cardiac Cath - Denies  Sleep Study - Denies CPAP - n/a  No DM  Last dose of GLP1 agonist- n/a GLP1 instructions: n/a  Blood Thinner Instructions: n/a Aspirin Instructions: n/a  ERAS Protcol - Clear liquids until 0600 morning of surgery PRE-SURGERY Ensure or G2- Ensure given to pt with instructions  COVID TEST- n/a   Anesthesia review: No. BP 176/85 at pre-op appointment. Pt has hx of HTN in the past, but does not needed any medication now per PCP. Normal SBP usually 120s  Patient denies shortness of breath, fever, cough and chest pain at PAT appointment. Pt denies any respiratory illness/infection in the last two months.   All instructions explained to the patient, with a verbal understanding of the material. Patient agrees to go over the instructions while at home for a better understanding. Patient also instructed to self quarantine after being tested for COVID-19. The opportunity to ask questions was provided.

## 2022-08-03 NOTE — H&P (Signed)
REFERRING PHYSICIAN: Avanell Shackleton, NP PROVIDER: Matthias Hughs, MD MRN: [id number] DOB: 1955-06-20  Subjective   Chief Complaint: New Consultation (Gallbladder)  History of Present Illness: Brittany Summers is a 67 y.o. female who is seen today as an office consultation for evaluation of New Consultation (Gallbladder)  Patient presents with a 5-6-year history of upper and right-sided abdominal pain. In late 2018 she had a significant episode of abdominal pain, nausea and vomiting. This resolved spontaneously. She had a more severe episode in 2019 that led her to the emergency department. At that point she did have a abdominal ultrasound and was seen to have a contracted gallbladder containing multiple stones. The gallbladder wall was upper limit of normal at 3.1 mm. Her pain resolved and she ate a bit more healthy diet with minimal processed foods and did not have any issues until 2024.  Recently she has had multiple attacks that have been increasing in severity and frequency. She describes feeling a uneasy sensation in her stomach followed by nausea. This is then followed by significant right upper abdominal pain. She generally develops vomiting as well and feels bad for about 12 hours. She then has fatigue that last for 24 to 48 hours after an episode. These always occur after eating. It is more common that they occur in the afternoon or evening but most recently she had an episode after breakfast Memorial Day weekend.  She is concerned about these accelerating attacks and would like to get her gallbladder removed.  Review of Systems: A complete review of systems was obtained from the patient. I have reviewed this information and discussed as appropriate with the patient. See HPI as well for other ROS.  Review of Systems  All other systems reviewed and are negative.   Medical History: Past Medical History:  Diagnosis Date  Arthritis  Hyperlipidemia   Patient Active  Problem List  Diagnosis  Symptomatic cholelithiasis   Past Surgical History:  Procedure Laterality Date  LEFT ANKLE SURGERY 07/06/2018    Allergies  Allergen Reactions  Penicillins Other (See Comments)  Has patient had a PCN reaction causing immediate rash, facial/tongue/throat swelling, SOB or lightheadedness with hypotension: Y Has patient had a PCN reaction causing severe rash involving mucus membranes or skin necrosis: Y Has patient had a PCN reaction that required hospitalization: N Has patient had a PCN reaction occurring within the last 10 years: Y If all of the above answers are "NO", then may proceed with Cephalosporin use.   Current Outpatient Medications on File Prior to Visit  Medication Sig Dispense Refill  atorvastatin (LIPITOR) 10 MG tablet Take 1 tablet by mouth once daily  ergocalciferol, vitamin D2, 1,250 mcg (50,000 unit) capsule Take 50,000 Units by mouth every 7 (seven) days   No current facility-administered medications on file prior to visit.   History reviewed. No pertinent family history.   Social History   Tobacco Use  Smoking Status Former  Types: Cigarettes  Start date: 2017  Smokeless Tobacco Never    Social History   Socioeconomic History  Marital status: Married  Tobacco Use  Smoking status: Former  Types: Cigarettes  Start date: 2017  Smokeless tobacco: Never  Substance and Sexual Activity  Alcohol use: Not Currently  Drug use: Never   Social Determinants of Health   Financial Resource Strain: Low Risk (03/11/2022)  Received from [facility]  Overall Financial Resource Strain (CARDIA)  Difficulty of Paying Living Expenses: Not hard at all  Food Insecurity: No  Food Insecurity (03/11/2022)  Received from [facility]  Hunger Vital Sign  Worried About Running Out of Food in the Last Year: Never true  Ran Out of Food in the Last Year: Never true  Transportation Needs: No Transportation Needs (03/11/2022)  Received from [facility] - Transportation  Lack of Transportation (Medical): No  Lack of Transportation (Non-Medical): No  Physical Activity: Sufficiently Active (03/11/2022)  Received from [facility]  Exercise Vital Sign  Days of Exercise per Week: 5 days  Minutes of Exercise per Session: 30 min  Stress: No Stress Concern Present (03/11/2022)  Received from [facility] of Occupational Health - Occupational Stress Questionnaire  Feeling of Stress : Not at all  Social Connections: Socially Integrated (03/11/2022)  Received from [facility]  Social Connection and Isolation Panel [NHANES]  Frequency of Communication with Friends and Family: More than three times a week  Frequency of Social Gatherings with Friends and Family: More than three times a week  Attends Religious Services: More than 4 times per year  Active Member of [employer] or Organizations: Yes  Attends Engineer, structural: More than 4 times per year  Marital Status: Married   Objective:   Vitals:   BP: (!) 155/85  Pulse: 81  Temp: 36.9 C (98.5 F)  SpO2: 97%  Weight: 56.7 kg (125 lb)  Height: 154.9 cm (5\' 1" )  PainSc: 0-No pain   Body mass index is 23.62 kg/m.  Head: Normocephalic and atraumatic.  Mouth/Throat: Oropharynx is clear and moist. No oropharyngeal exudate.  Eyes: Conjunctivae are normal. Pupils are equal, round, and reactive to light. No scleral icterus.  Neck: Normal range of motion. Neck supple. No tracheal deviation present. No thyromegaly present.  Cardiovascular: Normal rate, regular rhythm, normal heart sounds and intact distal pulses. Exam reveals no gallop and no friction rub.  No murmur heard. Respiratory: Effort normal and breath sounds normal. No respiratory distress. No wheezes, rales or rhonchi. No chest wall tenderness.  GI: Soft. Bowel sounds are normal. Abdomen is soft, non tender, non distended. No masses or hepatosplenomegaly is present. There is no rebound and no guarding.   Musculoskeletal: . Extremities are non tender and without deformity. Scar on left ankle Lymphadenopathy: No cervical or axillary adenopathy.  Neurological: Alert and oriented to person, place, and time. Coordination normal.  Skin: Skin is warm and dry. No rash noted. No diaphoresis. No erythema. No pallor.  Psychiatric: Normal mood and affect.Behavior is normal. Judgment and thought content normal.   Labs, Imaging and Diagnostic Testing: RUQ u/s 12/06/2017  IMPRESSION: Contracted gallbladder containing multiple stones. No sonographic evidence of acute cholecystitis.  Top-normal to minimally dilated common bile duct. Mild intrahepatic ductal dilation. These findings are not new however. No pancreatic head mass is observed. No definite intraluminal stone is demonstrated.  Assessment and Plan:   Diagnoses and all orders for this visit:  Symptomatic cholelithiasis   Patient has classic symptoms of symptomatic cholelithiasis. It is very reasonable to remove her gallbladder and expect that the symptoms would resolve.  She has not ever had liver function test done while she is actively having symptoms. I will repeat these preoperatively which will be at least 2 months after testing. If her liver function tests are abnormal I will plan to shoot a cholangiogram. Otherwise I will use ICG cholangiography.  The surgical procedure was described to the patient in detail. The patient was given educational material. I discussed the incision type and location,  the location of the gallbladder, the anatomy of the bile ducts and arteries, and the typical progression of surgery. I discussed the possibility of converting to an open operation. I advised of the risks of bleeding, infection, damage to other structures (such as the bile duct, intestine or liver), bile leak, need for other procedures or surgeries, and post op diarrhea/constipation. We discussed the risk of blood clot. We discussed the recovery  period and post operative restrictions. The patient was advised against taking blood thinners the week before surgery.    Matthias Hughs, MD

## 2022-08-03 NOTE — Anesthesia Preprocedure Evaluation (Addendum)
Anesthesia Evaluation  Patient identified by MRN, date of birth, ID band Patient awake    Reviewed: Allergy & Precautions, NPO status , Patient's Chart, lab work & pertinent test results  Airway Mallampati: II  TM Distance: >3 FB Neck ROM: Full    Dental no notable dental hx.    Pulmonary COPD, former smoker   Pulmonary exam normal        Cardiovascular hypertension,  Rhythm:Regular Rate:Normal     Neuro/Psych negative neurological ROS  negative psych ROS   GI/Hepatic negative GI ROS,,,(+) Hepatitis -, C  Endo/Other    Renal/GU negative Renal ROS     Musculoskeletal negative musculoskeletal ROS (+)    Abdominal Normal abdominal exam  (+)   Peds  Hematology Lab Results      Component                Value               Date                      WBC                      10.1                07/28/2022                HGB                      12.2                07/28/2022                HCT                      37.7                07/28/2022                MCV                      92.0                07/28/2022                PLT                      268                 07/28/2022             Lab Results      Component                Value               Date                      NA                       138                 07/28/2022                K                        4.3  07/28/2022                CO2                      28                  07/28/2022                GLUCOSE                  106 (H)             07/28/2022                BUN                      10                  07/28/2022                CREATININE               0.60                07/28/2022                CALCIUM                  9.6                 07/28/2022                GFR                      95.63               05/06/2022                GFRNONAA                 >60                 07/28/2022              Anesthesia  Other Findings   Reproductive/Obstetrics                             Anesthesia Physical Anesthesia Plan  ASA: 2  Anesthesia Plan: General   Post-op Pain Management: Celebrex PO (pre-op)* and Tylenol PO (pre-op)*   Induction: Intravenous  PONV Risk Score and Plan: 3 and Ondansetron, Dexamethasone and Treatment may vary due to age or medical condition  Airway Management Planned: Mask and Oral ETT  Additional Equipment: None  Intra-op Plan:   Post-operative Plan: Extubation in OR  Informed Consent: I have reviewed the patients History and Physical, chart, labs and discussed the procedure including the risks, benefits and alternatives for the proposed anesthesia with the patient or authorized representative who has indicated his/her understanding and acceptance.     Dental advisory given  Plan Discussed with:   Anesthesia Plan Comments:        Anesthesia Quick Evaluation

## 2022-08-04 ENCOUNTER — Other Ambulatory Visit: Payer: Self-pay

## 2022-08-04 ENCOUNTER — Ambulatory Visit (HOSPITAL_COMMUNITY): Payer: Medicare Other

## 2022-08-04 ENCOUNTER — Ambulatory Visit (HOSPITAL_COMMUNITY)
Admission: RE | Admit: 2022-08-04 | Discharge: 2022-08-04 | Disposition: A | Discharge status: Home / Self Care | Payer: Medicare Other | Attending: General Surgery | Admitting: General Surgery

## 2022-08-04 ENCOUNTER — Encounter (HOSPITAL_COMMUNITY): Payer: Self-pay | Admitting: General Surgery

## 2022-08-04 ENCOUNTER — Encounter (HOSPITAL_COMMUNITY)
Admission: RE | Disposition: A | Discharge status: Home / Self Care | Payer: Self-pay | Source: Home / Self Care | Attending: General Surgery

## 2022-08-04 ENCOUNTER — Ambulatory Visit (HOSPITAL_BASED_OUTPATIENT_CLINIC_OR_DEPARTMENT_OTHER): Payer: Medicare Other

## 2022-08-04 DIAGNOSIS — J449 Chronic obstructive pulmonary disease, unspecified: Secondary | ICD-10-CM | POA: Diagnosis not present

## 2022-08-04 DIAGNOSIS — K8010 Calculus of gallbladder with chronic cholecystitis without obstruction: Secondary | ICD-10-CM | POA: Insufficient documentation

## 2022-08-04 DIAGNOSIS — I10 Essential (primary) hypertension: Secondary | ICD-10-CM | POA: Insufficient documentation

## 2022-08-04 DIAGNOSIS — B1920 Unspecified viral hepatitis C without hepatic coma: Secondary | ICD-10-CM | POA: Diagnosis not present

## 2022-08-04 DIAGNOSIS — Z87891 Personal history of nicotine dependence: Secondary | ICD-10-CM | POA: Diagnosis not present

## 2022-08-04 HISTORY — PX: CHOLECYSTECTOMY: SHX55

## 2022-08-04 SURGERY — LAPAROSCOPIC CHOLECYSTECTOMY
Anesthesia: General | Site: Abdomen

## 2022-08-04 MED ORDER — PHENYLEPHRINE 80 MCG/ML (10ML) SYRINGE FOR IV PUSH (FOR BLOOD PRESSURE SUPPORT)
PREFILLED_SYRINGE | INTRAVENOUS | Status: AC
  Filled 2022-08-04: qty 10

## 2022-08-04 MED ORDER — ROCURONIUM BROMIDE 10 MG/ML (PF) SYRINGE
PREFILLED_SYRINGE | INTRAVENOUS | Status: DC | PRN
  Administered 2022-08-04: 50 mg via INTRAVENOUS

## 2022-08-04 MED ORDER — PROPOFOL 10 MG/ML IV BOLUS
INTRAVENOUS | Status: AC
  Filled 2022-08-04: qty 20

## 2022-08-04 MED ORDER — FENTANYL CITRATE (PF) 100 MCG/2ML IJ SOLN: 25.0000 ug | INTRAMUSCULAR | Status: DC | PRN

## 2022-08-04 MED ORDER — DEXAMETHASONE SODIUM PHOSPHATE 10 MG/ML IJ SOLN
INTRAMUSCULAR | Status: DC | PRN
  Administered 2022-08-04: 5 mg via INTRAVENOUS

## 2022-08-04 MED ORDER — FENTANYL CITRATE (PF) 250 MCG/5ML IJ SOLN: INTRAMUSCULAR | Status: DC | PRN

## 2022-08-04 MED ORDER — ENSURE PRE-SURGERY PO LIQD: 296.0000 mL | Freq: Once | ORAL | Status: DC

## 2022-08-04 MED ORDER — CELECOXIB 200 MG PO CAPS
200.0000 mg | ORAL_CAPSULE | Freq: Once | ORAL | Status: AC
  Administered 2022-08-04: 200 mg via ORAL
  Filled 2022-08-04: qty 1

## 2022-08-04 MED ORDER — ONDANSETRON HCL 4 MG/2ML IJ SOLN
INTRAMUSCULAR | Status: AC
  Filled 2022-08-04: qty 4

## 2022-08-04 MED ORDER — CIPROFLOXACIN IN D5W 400 MG/200ML IV SOLN
400.0000 mg | INTRAVENOUS | Status: AC
  Administered 2022-08-04: 400 mg via INTRAVENOUS
  Filled 2022-08-04: qty 200

## 2022-08-04 MED ORDER — LIDOCAINE HCL 1 % IJ SOLN
INTRAMUSCULAR | Status: AC
  Filled 2022-08-04: qty 20

## 2022-08-04 MED ORDER — MIDAZOLAM HCL 2 MG/2ML IJ SOLN
INTRAMUSCULAR | Status: DC | PRN
  Administered 2022-08-04: 1 mg via INTRAVENOUS

## 2022-08-04 MED ORDER — FENTANYL CITRATE (PF) 250 MCG/5ML IJ SOLN
INTRAMUSCULAR | Status: AC
  Filled 2022-08-04: qty 5

## 2022-08-04 MED ORDER — PHENYLEPHRINE 80 MCG/ML (10ML) SYRINGE FOR IV PUSH (FOR BLOOD PRESSURE SUPPORT)
PREFILLED_SYRINGE | INTRAVENOUS | Status: DC | PRN
  Administered 2022-08-04: 80 ug via INTRAVENOUS

## 2022-08-04 MED ORDER — MIDAZOLAM HCL 2 MG/2ML IJ SOLN
INTRAMUSCULAR | Status: AC
  Filled 2022-08-04: qty 2

## 2022-08-04 MED ORDER — LACTATED RINGERS IV SOLN: INTRAVENOUS | Status: DC

## 2022-08-04 MED ORDER — ORAL CARE MOUTH RINSE: 15.0000 mL | Freq: Once | OROMUCOSAL | Status: AC

## 2022-08-04 MED ORDER — CHLORHEXIDINE GLUCONATE CLOTH 2 % EX PADS: 6.0000 | MEDICATED_PAD | Freq: Once | CUTANEOUS | Status: DC

## 2022-08-04 MED ORDER — LIDOCAINE HCL 1 % IJ SOLN
INTRAMUSCULAR | Status: DC | PRN
  Administered 2022-08-04: 15 mL via INTRADERMAL

## 2022-08-04 MED ORDER — ROCURONIUM BROMIDE 10 MG/ML (PF) SYRINGE
PREFILLED_SYRINGE | INTRAVENOUS | Status: AC
  Filled 2022-08-04: qty 10

## 2022-08-04 MED ORDER — LIDOCAINE 2% (20 MG/ML) 5 ML SYRINGE
INTRAMUSCULAR | Status: DC | PRN
  Administered 2022-08-04: 60 mg via INTRAVENOUS

## 2022-08-04 MED ORDER — PROPOFOL 1000 MG/100ML IV EMUL
INTRAVENOUS | Status: AC
  Filled 2022-08-04: qty 100

## 2022-08-04 MED ORDER — ONDANSETRON HCL 4 MG/2ML IJ SOLN
INTRAMUSCULAR | Status: DC | PRN
  Administered 2022-08-04: 4 mg via INTRAVENOUS

## 2022-08-04 MED ORDER — OXYCODONE HCL 5 MG PO TABS: 5.0000 mg | ORAL_TABLET | Freq: Four times a day (QID) | ORAL | 0 refills | Status: DC | PRN

## 2022-08-04 MED ORDER — 0.9 % SODIUM CHLORIDE (POUR BTL) OPTIME
TOPICAL | Status: DC | PRN
  Administered 2022-08-04: 1000 mL

## 2022-08-04 MED ORDER — INDOCYANINE GREEN 25 MG IV SOLR: 25.0000 mg | Freq: Once | INTRAVENOUS | Status: DC

## 2022-08-04 MED ORDER — PHENYLEPHRINE HCL-NACL 20-0.9 MG/250ML-% IV SOLN
INTRAVENOUS | Status: DC | PRN
  Administered 2022-08-04: 20 ug/min via INTRAVENOUS

## 2022-08-04 MED ORDER — PROPOFOL 10 MG/ML IV BOLUS
INTRAVENOUS | Status: DC | PRN
  Administered 2022-08-04: 50 mg via INTRAVENOUS
  Administered 2022-08-04: 120 mg via INTRAVENOUS

## 2022-08-04 MED ORDER — DEXAMETHASONE SODIUM PHOSPHATE 10 MG/ML IJ SOLN
INTRAMUSCULAR | Status: AC
  Filled 2022-08-04: qty 2

## 2022-08-04 MED ORDER — BUPIVACAINE-EPINEPHRINE (PF) 0.25% -1:200000 IJ SOLN
INTRAMUSCULAR | Status: AC
  Filled 2022-08-04: qty 30

## 2022-08-04 MED ORDER — FENTANYL CITRATE (PF) 250 MCG/5ML IJ SOLN
INTRAMUSCULAR | Status: DC | PRN
  Administered 2022-08-04: 100 ug via INTRAVENOUS

## 2022-08-04 MED ORDER — PHENYLEPHRINE HCL-NACL 20-0.9 MG/250ML-% IV SOLN
INTRAVENOUS | Status: AC
  Filled 2022-08-04: qty 250

## 2022-08-04 MED ORDER — SUGAMMADEX SODIUM 200 MG/2ML IV SOLN
INTRAVENOUS | Status: DC | PRN
  Administered 2022-08-04: 150 mg via INTRAVENOUS

## 2022-08-04 MED ORDER — SODIUM CHLORIDE 0.9 % IR SOLN
Status: DC | PRN
  Administered 2022-08-04: 1

## 2022-08-04 MED ORDER — LIDOCAINE 2% (20 MG/ML) 5 ML SYRINGE
INTRAMUSCULAR | Status: AC
  Filled 2022-08-04: qty 5

## 2022-08-04 MED ORDER — CHLORHEXIDINE GLUCONATE 0.12 % MT SOLN
15.0000 mL | Freq: Once | OROMUCOSAL | Status: AC
  Administered 2022-08-04: 15 mL via OROMUCOSAL
  Filled 2022-08-04: qty 15

## 2022-08-04 MED ORDER — INDOCYANINE GREEN 25 MG IV SOLR
INTRAVENOUS | Status: DC | PRN
  Administered 2022-08-04: 7.5 mg via INTRAVENOUS

## 2022-08-04 MED ORDER — ACETAMINOPHEN 500 MG PO TABS
1000.0000 mg | ORAL_TABLET | ORAL | Status: AC
  Administered 2022-08-04: 1000 mg via ORAL
  Filled 2022-08-04: qty 2

## 2022-08-04 SURGICAL SUPPLY — 48 items
ADH SKN CLS APL DERMABOND .7 (GAUZE/BANDAGES/DRESSINGS) ×1
APL PRP STRL LF DISP 70% ISPRP (MISCELLANEOUS) ×1
APPLIER CLIP ROT 10 11.4 M/L (STAPLE) ×1
APR CLP MED LRG 11.4X10 (STAPLE) ×1
BAG COUNTER SPONGE SURGICOUNT (BAG) ×1 IMPLANT
BAG SPNG CNTER NS LX DISP (BAG)
BLADE CLIPPER SURG (BLADE) IMPLANT
CANISTER SUCT 3000ML PPV (MISCELLANEOUS) ×1 IMPLANT
CHLORAPREP W/TINT 26 (MISCELLANEOUS) ×1 IMPLANT
CLIP APPLIE ROT 10 11.4 M/L (STAPLE) ×1 IMPLANT
COVER SURGICAL LIGHT HANDLE (MISCELLANEOUS) ×1 IMPLANT
DERMABOND ADVANCED .7 DNX12 (GAUZE/BANDAGES/DRESSINGS) ×1 IMPLANT
DRAPE WARM FLUID 44X44 (DRAPES) ×1 IMPLANT
ELECT REM PT RETURN 9FT ADLT (ELECTROSURGICAL) ×1
ELECTRODE REM PT RTRN 9FT ADLT (ELECTROSURGICAL) ×1 IMPLANT
GLOVE BIO SURGEON STRL SZ 6 (GLOVE) ×1 IMPLANT
GLOVE INDICATOR 6.5 STRL GRN (GLOVE) ×1 IMPLANT
GOWN STRL REUS W/ TWL LRG LVL3 (GOWN DISPOSABLE) ×2 IMPLANT
GOWN STRL REUS W/ TWL XL LVL3 (GOWN DISPOSABLE) ×1 IMPLANT
GOWN STRL REUS W/TWL LRG LVL3 (GOWN DISPOSABLE) ×3
GOWN STRL REUS W/TWL XL LVL3 (GOWN DISPOSABLE) ×1
IRRIG SUCT STRYKERFLOW 2 WTIP (MISCELLANEOUS) ×1
IRRIGATION SUCT STRKRFLW 2 WTP (MISCELLANEOUS) ×1 IMPLANT
KIT BASIN OR (CUSTOM PROCEDURE TRAY) ×1 IMPLANT
KIT IMAGING PINPOINTPAQ (MISCELLANEOUS) IMPLANT
KIT TURNOVER KIT B (KITS) ×1 IMPLANT
L-HOOK LAP DISP 36CM (ELECTROSURGICAL) ×1
LHOOK LAP DISP 36CM (ELECTROSURGICAL) ×1 IMPLANT
NS IRRIG 1000ML POUR BTL (IV SOLUTION) ×1 IMPLANT
PAD ARMBOARD 7.5X6 YLW CONV (MISCELLANEOUS) ×1 IMPLANT
PENCIL BUTTON HOLSTER BLD 10FT (ELECTRODE) ×1 IMPLANT
SCISSORS LAP 5X35 DISP (ENDOMECHANICALS) ×1 IMPLANT
SET TUBE SMOKE EVAC HIGH FLOW (TUBING) ×1 IMPLANT
SLEEVE Z-THREAD 5X100MM (TROCAR) ×1 IMPLANT
SPECIMEN JAR SMALL (MISCELLANEOUS) ×1 IMPLANT
SPIKE FLUID TRANSFER (MISCELLANEOUS) ×1 IMPLANT
SUT MNCRL AB 4-0 PS2 18 (SUTURE) ×1 IMPLANT
SUT VICRYL 0 UR6 27IN ABS (SUTURE) IMPLANT
SYS BAG RETRIEVAL 10MM (BASKET) ×1
SYSTEM BAG RETRIEVAL 10MM (BASKET) ×1 IMPLANT
TOWEL GREEN STERILE (TOWEL DISPOSABLE) ×1 IMPLANT
TOWEL GREEN STERILE FF (TOWEL DISPOSABLE) ×1 IMPLANT
TRAY LAPAROSCOPIC MC (CUSTOM PROCEDURE TRAY) ×1 IMPLANT
TROCAR 11X100 Z THREAD (TROCAR) ×1 IMPLANT
TROCAR BALLN 12MMX100 BLUNT (TROCAR) ×1 IMPLANT
TROCAR Z-THREAD OPTICAL 5X100M (TROCAR) ×1 IMPLANT
WARMER LAPAROSCOPE (MISCELLANEOUS) ×1 IMPLANT
WATER STERILE IRR 1000ML POUR (IV SOLUTION) ×1 IMPLANT

## 2022-08-04 NOTE — Op Note (Signed)
Laparoscopic Cholecystectomy indocyanine green cholangiography  Indications: This patient presents with symptomatic cholelithiasis and will undergo laparoscopic cholecystectomy.  Pre-operative Diagnosis: symptomatic cholelithiasis  Post-operative Diagnosis: Chronic calculous cholecystitis  Surgeon: Almond Lint   Assistants: Hedda Slade, PA-C  Anesthesia: General endotracheal anesthesia and local  ASA Class: 2  Procedure Details   The patient was seen again in the Holding Room. The risks, benefits, complications, treatment options, and expected outcomes were discussed with the patient. The possibilities of  bleeding, recurrent infection, damage to nearby structures, the need for additional procedures, failure to diagnose a condition, the possible need to convert to an open procedure, and creating a complication requiring transfusion or operation were discussed with the patient. The likelihood of improving the patient's symptoms with return to their baseline status is good.    The patient and/or family concurred with the proposed plan, giving informed consent. The site of surgery properly noted. The patient was taken to OR # 2 and placed supine on the OR table.  SCDs were placed. Antibiotic prophylaxis was administered. General endotracheal anesthesia was then administered and tolerated well. After the induction, the abdomen was prepped with Chloraprep and draped in the sterile fashion. and the procedure verified as Laparoscopic Cholecystectomy with possible Intraoperative Cholangiogram. A Time Out was held and the above information confirmed.  Local anesthetic agent was injected into the skin near the umbilicus and a vertical infraumbilical incision was made with a #11 blade. I dissected down to the abdominal fascia with blunt dissection.  The fascia was incised vertically and the peritoneal cavity was entered bluntly.  A pursestring suture of 0-Vicryl was placed around the fascial opening.  The  Hasson cannula was inserted and secured with the stay suture.  Pneumoperitoneum was then created with CO2 and tolerated well without any adverse changes in the patient's vital signs. An 11-mm port was placed in the subxiphoid position.  Two 5-mm ports were placed in the right upper quadrant. All skin incisions were infiltrated with a local anesthetic agent before making the incision and placing the trocars.   The patient was placed into reverse Trendelenburg position and was tilted slightly to the patient's left.  The gallbladder was not immediately identified due to adhesions noted.  Adhesions were lysed bluntly and with the electrocautery where indicated, taking care not to injure any adjacent organs or viscus. The gallbladder was then identified.  The fundus of the gallbladder was then grasped and retracted cephalad.  The infundibulum was grasped and retracted laterally, exposing the peritoneum overlying the triangle of Calot. The cystic duct and cystic artery were identified. There was an anterior and posterior arterial branch.  These were both skeletonized.  A window between the gallbladder and the liver was obtained to ensure a critical view.    The ICG cholangiography was then performed.  The ICG had been administered pre op.  There was clear vue of the porta hepatis and common duct much more posterior than the cystic duct.  The cystic duct was then clipped once on the specimen side and thrice on the patient side.  Both branches of the cystic artery was clipped once on the patient side and doubly clipped on the patient side.    The gallbladder was dissected from the liver bed in retrograde fashion with the electrocautery. The gallbladder was removed and placed in a retrieval bag.  The gallbladder and bag were then removed through the umbilical port site.  The liver bed was irrigated and inspected. Hemostasis was achieved with the  electrocautery. Copious irrigation was utilized and was repeatedly  aspirated until clear.    Pneumoperitoneum was released as we removed the trocars.   The pursestring suture was used to close the umbilical fascia.  One additional 0-0 vicryl was needed.    4-0 Monocryl was used to close the skin in subcuticular fashion.   The skin was cleaned and dry, and Dermabond was applied. The patient was then extubated and brought to the recovery room in stable condition. Instrument, sponge, and needle counts were correct at closure and at the conclusion of the case.   Findings: Chronic inflammation of gallbladder. Normal anatomy.    Estimated Blood Loss: min         Drains: none          Specimens: Gallbladder to pathology       Complications: None; patient tolerated the procedure well.         Disposition: PACU - hemodynamically stable.         Condition: stable

## 2022-08-04 NOTE — Anesthesia Procedure Notes (Signed)
Procedure Name: Intubation Date/Time: 08/04/2022 9:27 AM  Performed by: Aundria Rud, CRNAPre-anesthesia Checklist: Patient identified, Emergency Drugs available, Suction available and Patient being monitored Patient Re-evaluated:Patient Re-evaluated prior to induction Oxygen Delivery Method: Circle System Utilized Preoxygenation: Pre-oxygenation with 100% oxygen Induction Type: IV induction Ventilation: Mask ventilation without difficulty and Oral airway inserted - appropriate to patient size Laryngoscope Size: Glidescope and 3 Grade View: Grade I Tube type: Oral Tube size: 7.0 mm Number of attempts: 2 Airway Equipment and Method: Stylet Placement Confirmation: ETT inserted through vocal cords under direct vision, positive ETCO2 and breath sounds checked- equal and bilateral Secured at: 21 cm Tube secured with: Tape Dental Injury: Teeth and Oropharynx as per pre-operative assessment  Comments: SRNA attempted with miller 2 with grade 3 view and limited mouth opening. Oral airway placed, gas turned on and more propofol given. Switched to Glidescope MAC 3 with grade 1 view.

## 2022-08-04 NOTE — Interval H&P Note (Signed)
History and Physical Interval Note:  08/04/2022 8:55 AM  Brittany Summers  has presented today for surgery, with the diagnosis of SYMPTOMATIC CHOLELITHIASIS.  The various methods of treatment have been discussed with the patient and family. After consideration of risks, benefits and other options for treatment, the patient has consented to  Procedure(s): LAPAROSCOPIC CHOLECYSTECTOMY WITH POSSIBLE INTRAOPERATIVE CHOLANGIOGRAM (N/A) as a surgical intervention.  The patient's history has been reviewed, patient examined, no change in status, stable for surgery.  I have reviewed the patient's chart and labs.  Questions were answered to the patient's satisfaction.     Almond Lint

## 2022-08-04 NOTE — Anesthesia Postprocedure Evaluation (Signed)
Anesthesia Post Note  Patient: Brittany Summers  Procedure(s) Performed: LAPAROSCOPIC CHOLECYSTECTOMY (Abdomen)     Patient location during evaluation: PACU Anesthesia Type: General Level of consciousness: awake and alert Pain management: pain level controlled Vital Signs Assessment: post-procedure vital signs reviewed and stable Respiratory status: spontaneous breathing, nonlabored ventilation, respiratory function stable and patient connected to nasal cannula oxygen Cardiovascular status: blood pressure returned to baseline and stable Postop Assessment: no apparent nausea or vomiting Anesthetic complications: no   No notable events documented.  Last Vitals:  Vitals:   08/04/22 1045 08/04/22 1050  BP: 123/70 129/66  Pulse: 61 61  Resp: 18 14  Temp:  36.6 C  SpO2: 99% 100%    Last Pain:  Vitals:   08/04/22 1045  TempSrc:   PainSc: 0-No pain                 Earl Lites P Shamekia Tippets

## 2022-08-04 NOTE — Discharge Instructions (Signed)
[facility] Office Phone Number [phone]   POST OP INSTRUCTIONS  Always review your discharge instruction sheet given to you by the facility where your surgery was performed.  IF YOU HAVE DISABILITY OR FAMILY LEAVE FORMS, YOU MUST BRING THEM TO THE OFFICE FOR PROCESSING.  DO NOT GIVE THEM TO YOUR DOCTOR.  Take 2 tylenol (acetominophen) three times a day for 3 days.  If you still have pain, add ibuprofen with food in between if able to take this (if you have kidney issues or stomach issues, do not take ibuprofen).  If both of those are not enough, add the narcotic pain pill.  If you find you are needing a lot of this overnight after surgery, call the next morning for a refill.   Take your usually prescribed medications unless otherwise directed If you need a refill on your pain medication, please contact your pharmacy.  They will contact our office to request authorization.  Prescriptions will not be filled after 5pm or on week-ends. You should eat very light the first 24 hours after surgery, such as soup, crackers, pudding, etc.  Resume your normal diet the day after surgery It is common to experience some constipation if taking pain medication after surgery.  Increasing fluid intake and taking a stool softener will usually help or prevent this problem from occurring.  A mild laxative (Milk of Magnesia or Miralax) should be taken according to package directions if there are no bowel movements after 48 hours. You may shower in 48 hours.  The surgical glue will flake off in 2-3 weeks.   ACTIVITIES:  No strenuous activity or heavy lifting for 2 weeks. You may drive when you no longer are taking prescription pain medication, you can comfortably wear a seatbelt, and you can safely maneuver your car and apply brakes. RETURN TO WORK:  __________as tolerated if no lifting for 2 weeks. _______________ Bonita Quin should see your doctor in the office for a follow-up appointment approximately  three-four weeks after your surgery.    WHEN TO CALL YOUR DOCTOR: Fever over 101.0 Nausea and/or vomiting. Extreme swelling or bruising. Continued bleeding from incision. Increased pain, redness, or drainage from the incision.  The clinic staff is available to answer your questions during regular business hours.  Please don't hesitate to call and ask to speak to one of the nurses for clinical concerns.  If you have a medical emergency, go to the nearest emergency room or call 911.  A surgeon from [facility] is always on call at the hospital.  For further questions, please visit [email or web address]

## 2022-08-04 NOTE — Transfer of Care (Signed)
Immediate Anesthesia Transfer of Care Note  Patient: Brittany Summers  Procedure(s) Performed: LAPAROSCOPIC CHOLECYSTECTOMY (Abdomen)  Patient Location: PACU  Anesthesia Type:General  Level of Consciousness: awake, alert , and patient cooperative  Airway & Oxygen Therapy: Patient Spontanous Breathing and Patient connected to face mask oxygen  Post-op Assessment: Report given to RN, Post -op Vital signs reviewed and stable, and Patient moving all extremities X 4  Post vital signs: Reviewed and stable  Last Vitals:  Vitals Value Taken Time  BP 159/71 08/04/22 1021  Temp    Pulse 75 08/04/22 1022  Resp 18 08/04/22 1022  SpO2 100 % 08/04/22 1022  Vitals shown include unfiled device data.  Last Pain:  Vitals:   08/04/22 0808  TempSrc:   PainSc: 0-No pain         Complications: No notable events documented.

## 2022-08-05 ENCOUNTER — Encounter (HOSPITAL_COMMUNITY): Payer: Self-pay | Admitting: General Surgery

## 2022-08-05 LAB — SURGICAL PATHOLOGY

## 2022-09-01 ENCOUNTER — Other Ambulatory Visit: Payer: Self-pay | Admitting: *Deleted

## 2022-09-01 DIAGNOSIS — Z87891 Personal history of nicotine dependence: Secondary | ICD-10-CM

## 2022-09-01 DIAGNOSIS — Z122 Encounter for screening for malignant neoplasm of respiratory organs: Secondary | ICD-10-CM

## 2022-09-23 ENCOUNTER — Ambulatory Visit (HOSPITAL_COMMUNITY)
Admission: RE | Admit: 2022-09-23 | Discharge: 2022-09-23 | Disposition: A | Discharge status: Home / Self Care | Payer: Medicare Other | Source: Ambulatory Visit | Attending: Acute Care | Admitting: Acute Care

## 2022-09-23 DIAGNOSIS — Z122 Encounter for screening for malignant neoplasm of respiratory organs: Secondary | ICD-10-CM | POA: Diagnosis present

## 2022-09-23 DIAGNOSIS — Z87891 Personal history of nicotine dependence: Secondary | ICD-10-CM | POA: Diagnosis present

## 2022-10-04 ENCOUNTER — Other Ambulatory Visit: Payer: Self-pay | Admitting: Acute Care

## 2022-10-04 DIAGNOSIS — Z122 Encounter for screening for malignant neoplasm of respiratory organs: Secondary | ICD-10-CM

## 2022-10-04 DIAGNOSIS — Z87891 Personal history of nicotine dependence: Secondary | ICD-10-CM

## 2022-10-28 ENCOUNTER — Ambulatory Visit
Admission: RE | Admit: 2022-10-28 | Discharge: 2022-10-28 | Disposition: A | Discharge status: Home / Self Care | Payer: Medicare Other | Source: Ambulatory Visit | Attending: Family Medicine | Admitting: Family Medicine

## 2022-10-28 DIAGNOSIS — M8580 Other specified disorders of bone density and structure, unspecified site: Secondary | ICD-10-CM

## 2022-10-28 DIAGNOSIS — E2839 Other primary ovarian failure: Secondary | ICD-10-CM

## 2022-10-28 DIAGNOSIS — Z1231 Encounter for screening mammogram for malignant neoplasm of breast: Secondary | ICD-10-CM

## 2022-12-13 ENCOUNTER — Ambulatory Visit (AMBULATORY_SURGERY_CENTER): Payer: Medicare Other

## 2022-12-13 ENCOUNTER — Telehealth: Payer: Self-pay | Admitting: Gastroenterology

## 2022-12-13 VITALS — Ht 61.0 in | Wt 127.0 lb

## 2022-12-13 DIAGNOSIS — Z860100 Personal history of colon polyps, unspecified: Secondary | ICD-10-CM

## 2022-12-13 MED ORDER — SUTAB 1479-225-188 MG PO TABS
12.0000 | ORAL_TABLET | ORAL | 0 refills | Status: DC
Start: 2022-12-13 — End: 2022-12-21

## 2022-12-13 MED ORDER — NA SULFATE-K SULFATE-MG SULF 17.5-3.13-1.6 GM/177ML PO SOLN: 1.0000 | Freq: Once | ORAL | 0 refills | Status: AC

## 2022-12-13 NOTE — Telephone Encounter (Signed)
Spoke with patient. Suprep rx and new prep instructions have been provided. Offered patient good rx coupon for [facility] for 32.95 pt opted to have rx sent to [facility].

## 2022-12-13 NOTE — Progress Notes (Signed)
No egg or soy allergy known to patient  No issues known to pt with past sedation with any surgeries or procedures Patient denies ever being told they had issues or difficulty with intubation  No FH of Malignant Hyperthermia Pt is not on diet pills Pt is not on  home 02  Pt is not on blood thinners  Pt denies issues with constipation  No A fib or A flutter Have any cardiac testing pending--no  LOA: independent  Prep: SUTAB  Patient's chart reviewed by Cathlyn Parsons CNRA prior to previsit and patient appropriate for the [facility].  Previsit completed and red dot placed by patient's name on their procedure day (on provider's schedule).     PV competed with patient. Prep instructions and sutab code sent via mychart . Patient instructed to go to 2nd floor lobby if she decides to pick up a hard copy of her instructions.

## 2022-12-13 NOTE — Telephone Encounter (Signed)
Patient called and stated that her insurance did not cover her SUTAB. She was wondering if you could change it an alternative. Please advise.

## 2022-12-21 ENCOUNTER — Ambulatory Visit: Payer: Medicare Other | Admitting: Gastroenterology

## 2022-12-21 ENCOUNTER — Encounter: Payer: Self-pay | Admitting: Gastroenterology

## 2022-12-21 VITALS — BP 121/73 | HR 66 | Temp 99.1°F | Resp 10 | Ht 61.0 in | Wt 127.0 lb

## 2022-12-21 DIAGNOSIS — K648 Other hemorrhoids: Secondary | ICD-10-CM | POA: Diagnosis not present

## 2022-12-21 DIAGNOSIS — K644 Residual hemorrhoidal skin tags: Secondary | ICD-10-CM

## 2022-12-21 DIAGNOSIS — Z1211 Encounter for screening for malignant neoplasm of colon: Secondary | ICD-10-CM | POA: Diagnosis not present

## 2022-12-21 DIAGNOSIS — K629 Disease of anus and rectum, unspecified: Secondary | ICD-10-CM

## 2022-12-21 DIAGNOSIS — D122 Benign neoplasm of ascending colon: Secondary | ICD-10-CM

## 2022-12-21 DIAGNOSIS — D128 Benign neoplasm of rectum: Secondary | ICD-10-CM

## 2022-12-21 DIAGNOSIS — C20 Malignant neoplasm of rectum: Secondary | ICD-10-CM | POA: Diagnosis not present

## 2022-12-21 DIAGNOSIS — Z860100 Personal history of colon polyps, unspecified: Secondary | ICD-10-CM

## 2022-12-21 DIAGNOSIS — Z860101 Personal history of adenomatous and serrated colon polyps: Secondary | ICD-10-CM

## 2022-12-21 DIAGNOSIS — D123 Benign neoplasm of transverse colon: Secondary | ICD-10-CM

## 2022-12-21 MED ORDER — SODIUM CHLORIDE 0.9 % IV SOLN: 500.0000 mL | Freq: Once | INTRAVENOUS | Status: DC

## 2022-12-21 NOTE — Progress Notes (Signed)
Pt's states no medical or surgical changes since previsit or office visit. 

## 2022-12-21 NOTE — Progress Notes (Signed)
[facility] History and Physical   Primary Care Physician:  Avanell Shackleton, NP-C   Reason for Procedure:  History of adenomatous colon polyps  Plan:    Surveillance colonoscopy with possible interventions as needed     HPI: Brittany Summers is a very pleasant 67 y.o. female here for surveillance colonoscopy. Denies any nausea, vomiting, abdominal pain, melena or bright red blood per rectum  The risks and benefits as well as alternatives of endoscopic procedure(s) have been discussed and reviewed. All questions answered. The patient agrees to proceed.    Past Medical History:  Diagnosis Date   Chronic hepatitis C without hepatic coma (HCC) 09/22/2016   finished treatment for this   Gallstone    known with h/o cholecystitis pain, no recent recurrence   Hypertension    no meds taken    Past Surgical History:  Procedure Laterality Date   BREAST BIOPSY Left 2017   CHOLECYSTECTOMY N/A 08/04/2022   Procedure: LAPAROSCOPIC CHOLECYSTECTOMY;  Surgeon: Almond Lint, MD;  Location: [facility] OR;  Service: General;  Laterality: N/A;   COLONOSCOPY     MAXILLARY SINUS LIFT     MOUTH SURGERY     ORIF ANKLE FRACTURE Left 07/06/2018   Procedure: OPEN REDUCTION INTERNAL FIXATION (ORIF) left ankle trimalleolar fracture;  Surgeon: Toni Arthurs, MD;  Location: [facility];  Service: Orthopedics;  Laterality: Left;    TONSILLECTOMY     age 12   UPPER GASTROINTESTINAL ENDOSCOPY      Prior to Admission medications   Medication Sig Start Date End Date Taking? Authorizing Provider  atorvastatin (LIPITOR) 10 MG tablet Take 1 tablet (10 mg total) by mouth daily. 03/18/22  Yes Henson, Vickie L, NP-C  Cholecalciferol (VITAMIN D3) 50 MCG (2000 UT) TABS Take 2,000 Units by mouth in the morning and at bedtime.   Yes [provider]    Current Outpatient Medications  Medication Sig Dispense Refill   atorvastatin (LIPITOR) 10 MG tablet Take 1 tablet (10 mg total)  by mouth daily. 90 tablet 3   Cholecalciferol (VITAMIN D3) 50 MCG (2000 UT) TABS Take 2,000 Units by mouth in the morning and at bedtime.     Current Facility-Administered Medications  Medication Dose Route Frequency Provider Last Rate Last Admin   0.9 %  sodium chloride infusion  500 mL Intravenous Once Tzion Wedel, Eleonore Chiquito, MD       Facility-Administered Medications Ordered in Other Visits  Medication Dose Route Frequency Provider Last Rate Last Admin   indocyanine green (IC-GREEN) injection 25 mg  25 mg Topical Once Almond Lint, MD        Allergies as of 12/21/2022 - Review Complete 12/21/2022  Allergen Reaction Noted   Penicillins  06/27/2015    Family History  Problem Relation Age of Onset   Hypertension Mother    Breast cancer Mother        Dx in her early 82's   Hypertension Father    Diabetes Father    Leukemia Father    Colon cancer Maternal Uncle 73 - 79   Breast cancer Maternal Grandmother    Stomach cancer Maternal Grandmother 51   Stomach cancer Maternal Great-grandmother    Colon polyps Neg Hx    Rectal cancer Neg Hx    Esophageal cancer Neg Hx     Social History   Socioeconomic History   Marital status: Married    Spouse name: Not on file   Number of children: 0   Years  of education: Not on file   Highest education level: Not on file  Occupational History   Occupation: retired  Tobacco Use   Smoking status: Former    Current packs/day: 0.00    Types: Cigarettes    Quit date: 07/15/2015    Years since quitting: 7.4   Smokeless tobacco: Never  Vaping Use   Vaping status: Never Used  Substance and Sexual Activity   Alcohol use: No    Alcohol/week: 0.0 standard drinks of alcohol    Comment: as of 04-28-16 no alcohol    Drug use: No   Sexual activity: Not Currently    Partners: Male  Other Topics Concern   Not on file  Social History Narrative   Not on file   Social Determinants of Health   Financial Resource Strain: Low Risk  (03/11/2022)    Overall Financial Resource Strain (CARDIA)    Difficulty of Paying Living Expenses: Not hard at all  Food Insecurity: No Food Insecurity (03/11/2022)   Hunger Vital Sign    Worried About Running Out of Food in the Last Year: Never true    Ran Out of Food in the Last Year: Never true  Transportation Needs: No Transportation Needs (03/11/2022)   PRAPARE - Administrator, Civil Service (Medical): No    Lack of Transportation (Non-Medical): No  Physical Activity: Sufficiently Active (03/11/2022)   Exercise Vital Sign    Days of Exercise per Week: 5 days    Minutes of Exercise per Session: 30 min  Stress: No Stress Concern Present (03/11/2022)   [employer] of Occupational Health - Occupational Stress Questionnaire    Feeling of Stress : Not at all  Social Connections: Socially Integrated (03/11/2022)   Social Connection and Isolation Panel [NHANES]    Frequency of Communication with Friends and Family: More than three times a week    Frequency of Social Gatherings with Friends and Family: More than three times a week    Attends Religious Services: More than 4 times per year    Active Member of [employer] or Organizations: Yes    Attends Banker Meetings: More than 4 times per year    Marital Status: Married  Catering manager Violence: Not At Risk (03/11/2022)   Humiliation, Afraid, Rape, and Kick questionnaire    Fear of Current or Ex-Partner: No    Emotionally Abused: No    Physically Abused: No    Sexually Abused: No    Review of Systems:  All other review of systems negative except as mentioned in the HPI.  Physical Exam: Vital signs in last 24 hours: BP 134/69   Pulse (!) 103   Temp 99.1 F (37.3 C)   Ht 5\' 1"  (1.549 m)   Wt 127 lb (57.6 kg)   LMP  (LMP Unknown)   SpO2 98%   BMI 24.00 kg/m  General:   Alert, NAD Lungs:  Clear .   Heart:  Regular rate and rhythm Abdomen:  Soft, nontender and nondistended. Neuro/Psych:  Alert and cooperative.  Normal mood and affect. A and O x 3  Reviewed labs, radiology imaging, old records and pertinent past GI work up  Patient is appropriate for planned procedure(s) and anesthesia in an ambulatory setting   K. Scherry Ran , MD [phone]

## 2022-12-21 NOTE — Progress Notes (Signed)
Vss nad trans to pacu 

## 2022-12-21 NOTE — Patient Instructions (Signed)
Handouts provided on polyps and hemorrhoids.  Resume previous diet.  Continue present medications.  Await pathology results.  Repeat colonoscopy date to be determined after pending pathology results are reviewed for surveillance based on pathology results.   YOU HAD AN ENDOSCOPIC PROCEDURE TODAY AT THE [facility]:   Refer to the procedure report that was given to you for any specific questions about what was found during the examination.  If the procedure report does not answer your questions, please call your gastroenterologist to clarify.  If you requested that your care partner not be given the details of your procedure findings, then the procedure report has been included in a sealed envelope for you to review at your convenience later.  YOU SHOULD EXPECT: Some feelings of bloating in the abdomen. Passage of more gas than usual.  Walking can help get rid of the air that was put into your GI tract during the procedure and reduce the bloating. If you had a lower endoscopy (such as a colonoscopy or flexible sigmoidoscopy) you may notice spotting of blood in your stool or on the toilet paper. If you underwent a bowel prep for your procedure, you may not have a normal bowel movement for a few days.  Please Note:  You might notice some irritation and congestion in your nose or some drainage.  This is from the oxygen used during your procedure.  There is no need for concern and it should clear up in a day or so.  SYMPTOMS TO REPORT IMMEDIATELY:  Following lower endoscopy (colonoscopy or flexible sigmoidoscopy):  Excessive amounts of blood in the stool  Significant tenderness or worsening of abdominal pains  Swelling of the abdomen that is new, acute  Fever of 100F or higher  For urgent or emergent issues, a gastroenterologist can be reached at any hour by calling [phone]. Do not use MyChart messaging for urgent concerns.    DIET:  We do recommend a small meal at first, but  then you may proceed to your regular diet.  Drink plenty of fluids but you should avoid alcoholic beverages for 24 hours.  ACTIVITY:  You should plan to take it easy for the rest of today and you should NOT DRIVE or use heavy machinery until tomorrow (because of the sedation medicines used during the test).    FOLLOW UP: Our staff will call the number listed on your records the next business day following your procedure.  We will call around 7:15- 8:00 am to check on you and address any questions or concerns that you may have regarding the information given to you following your procedure. If we do not reach you, we will leave a message.     If any biopsies were taken you will be contacted by phone or by letter within the next 1-3 weeks.  Please call us at [phone] if you have not heard about the biopsies in 3 weeks.    SIGNATURES/CONFIDENTIALITY: You and/or your care partner have signed paperwork which will be entered into your electronic medical record.  These signatures attest to the fact that that the information above on your After Visit Summary has been reviewed and is understood.  Full responsibility of the confidentiality of this discharge information lies with you and/or your care-partner.

## 2022-12-21 NOTE — Progress Notes (Signed)
Called to room to assist during endoscopic procedure.  Patient ID and intended procedure confirmed with present staff. Received instructions for my participation in the procedure from the performing physician.  

## 2022-12-21 NOTE — Op Note (Signed)
[facility] Patient Name: Brittany Summers Procedure Date: 12/21/2022 8:46 AM MRN: [id number] Endoscopist: Napoleon Form , MD, [id number] Age: 67 Referring MD:  Date of Birth: Oct 15, 1955 Gender: Female Account #: [id number] Procedure:                Colonoscopy Indications:              High risk colon cancer surveillance: Personal                            history of adenoma (10 mm or greater in size), High                            risk colon cancer surveillance: Personal history of                            multiple (3 or more) adenomas Medicines:                Monitored Anesthesia Care Procedure:                Pre-Anesthesia Assessment:                           - Prior to the procedure, a History and Physical                            was performed, and patient medications and                            allergies were reviewed. The patient's tolerance of                            previous anesthesia was also reviewed. The risks                            and benefits of the procedure and the sedation                            options and risks were discussed with the patient.                            All questions were answered, and informed consent                            was obtained. Prior Anticoagulants: The patient has                            taken no anticoagulant or antiplatelet agents. ASA                            Grade Assessment: II - A patient with mild systemic                            disease. After reviewing the risks and benefits,  the patient was deemed in satisfactory condition to                            undergo the procedure.                           After obtaining informed consent, the colonoscope                            was passed under direct vision. Throughout the                            procedure, the patient's blood pressure, pulse, and                            oxygen saturations  were monitored continuously. The                            Olympus Scope [id number] was introduced through the                            anus and advanced to the the cecum, identified by                            appendiceal orifice and ileocecal valve. The                            colonoscopy was performed without difficulty. The                            patient tolerated the procedure well. The quality                            of the bowel preparation was good. The ileocecal                            valve, appendiceal orifice, and rectum were                            photographed. Scope In: 8:50:48 AM Scope Out: 9:20:17 AM Scope Withdrawal Time: 0 hours 24 minutes 15 seconds  Total Procedure Duration: 0 hours 29 minutes 29 seconds  Findings:                 The perianal and digital rectal examinations were                            normal.                           An 18 mm polyp was found in the ascending colon.                            The polyp was granular lateral spreading. The polyp  was removed with a piecemeal technique using a cold                            snare. Resection and retrieval were complete.                           Six sessile polyps were found in the transverse                            colon and ascending colon. The polyps were 3 to 5                            mm in size. These polyps were removed with a cold                            snare. Resection and retrieval were complete.                           A 15 mm polypoid lesion was found at the anus. The                            lesion was ulcerated, with raised edges and                            appeared infiltrative. No bleeding was present.                            This was biopsied with a cold forceps for histology.                           Non-bleeding external and internal hemorrhoids were                            found during retroflexion. The  hemorrhoids were                            medium-sized. Complications:            No immediate complications. Estimated Blood Loss:     Estimated blood loss was minimal. Impression:               - One 18 mm polyp in the ascending colon, removed                            piecemeal using a cold snare. Resected and                            retrieved.                           - Six 3 to 5 mm polyps in the transverse colon and                            in the ascending colon,  removed with a cold snare.                            Resected and retrieved.                           - Rule out malignancy, polypoid lesion at the anus.                            Biopsied.                           - Non-bleeding external and internal hemorrhoids. Recommendation:           - Patient has a contact number available for                            emergencies. The signs and symptoms of potential                            delayed complications were discussed with the                            patient. Return to normal activities tomorrow.                            Written discharge instructions were provided to the                            patient.                           - Resume previous diet.                           - Continue present medications.                           - Await pathology results.                           - Repeat colonoscopy date to be determined after                            pending pathology results are reviewed for                            surveillance based on pathology results. Napoleon Form, MD 12/21/2022 9:29:03 AM This report has been signed electronically.

## 2022-12-22 ENCOUNTER — Telehealth: Payer: Self-pay

## 2022-12-22 LAB — SURGICAL PATHOLOGY

## 2022-12-22 NOTE — Telephone Encounter (Signed)
  Follow up Call-     12/21/2022    7:51 AM  Call back number  Post procedure Call Back phone  # [phone]  Permission to leave phone message Yes     Patient questions:  Do you have a fever, pain , or abdominal swelling? No. Pain Score  0 *  Have you tolerated food without any problems? Yes.    Have you been able to return to your normal activities? Yes.    Do you have any questions about your discharge instructions: Diet   No. Medications  No. Follow up visit  No.  Do you have questions or concerns about your Care? No.  Actions: * If pain score is 4 or above: No action needed, pain <4.

## 2022-12-23 ENCOUNTER — Other Ambulatory Visit: Payer: Self-pay

## 2022-12-23 DIAGNOSIS — C210 Malignant neoplasm of anus, unspecified: Secondary | ICD-10-CM

## 2022-12-29 ENCOUNTER — Inpatient Hospital Stay: Payer: Medicare Other

## 2022-12-29 ENCOUNTER — Telehealth: Payer: Self-pay

## 2022-12-29 ENCOUNTER — Inpatient Hospital Stay: Payer: Medicare Other | Attending: Nurse Practitioner | Admitting: Nurse Practitioner

## 2022-12-29 ENCOUNTER — Encounter: Payer: Self-pay | Admitting: Nurse Practitioner

## 2022-12-29 VITALS — BP 155/72 | HR 85 | Temp 98.3°F | Resp 15 | Ht 60.0 in | Wt 131.9 lb

## 2022-12-29 DIAGNOSIS — Z803 Family history of malignant neoplasm of breast: Secondary | ICD-10-CM | POA: Diagnosis not present

## 2022-12-29 DIAGNOSIS — Z87891 Personal history of nicotine dependence: Secondary | ICD-10-CM | POA: Diagnosis not present

## 2022-12-29 DIAGNOSIS — C210 Malignant neoplasm of anus, unspecified: Secondary | ICD-10-CM | POA: Diagnosis not present

## 2022-12-29 DIAGNOSIS — Z806 Family history of leukemia: Secondary | ICD-10-CM | POA: Diagnosis not present

## 2022-12-29 DIAGNOSIS — Z800 Family history of malignant neoplasm of digestive organs: Secondary | ICD-10-CM | POA: Diagnosis not present

## 2022-12-29 LAB — CBC WITH DIFFERENTIAL (CANCER CENTER ONLY)
Abs Immature Granulocytes: 0.03 10*3/uL (ref 0.00–0.07)
Basophils Absolute: 0.1 10*3/uL (ref 0.0–0.1)
Basophils Relative: 1 %
Eosinophils Absolute: 0.2 10*3/uL (ref 0.0–0.5)
Eosinophils Relative: 2 %
HCT: 40.7 % (ref 36.0–46.0)
Hemoglobin: 13.3 g/dL (ref 12.0–15.0)
Immature Granulocytes: 0 %
Lymphocytes Relative: 24 %
Lymphs Abs: 2.4 10*3/uL (ref 0.7–4.0)
MCH: 30.1 pg (ref 26.0–34.0)
MCHC: 32.7 g/dL (ref 30.0–36.0)
MCV: 92.1 fL (ref 80.0–100.0)
Monocytes Absolute: 0.8 10*3/uL (ref 0.1–1.0)
Monocytes Relative: 8 %
Neutro Abs: 6.7 10*3/uL (ref 1.7–7.7)
Neutrophils Relative %: 65 %
Platelet Count: 295 10*3/uL (ref 150–400)
RBC: 4.42 MIL/uL (ref 3.87–5.11)
RDW: 12.9 % (ref 11.5–15.5)
WBC Count: 10.1 10*3/uL (ref 4.0–10.5)
nRBC: 0 % (ref 0.0–0.2)

## 2022-12-29 LAB — CMP (CANCER CENTER ONLY)
ALT: 16 U/L (ref 0–44)
AST: 17 U/L (ref 15–41)
Albumin: 4.7 g/dL (ref 3.5–5.0)
Alkaline Phosphatase: 52 U/L (ref 38–126)
Anion gap: 7 (ref 5–15)
BUN: 12 mg/dL (ref 8–23)
CO2: 30 mmol/L (ref 22–32)
Calcium: 9.9 mg/dL (ref 8.9–10.3)
Chloride: 102 mmol/L (ref 98–111)
Creatinine: 0.58 mg/dL (ref 0.44–1.00)
GFR, Estimated: >60 mL/min (ref >60)
Glucose, Bld: 95 mg/dL (ref 70–99)
Potassium: 3.8 mmol/L (ref 3.5–5.1)
Sodium: 139 mmol/L (ref 135–145)
Total Bilirubin: 0.5 mg/dL (ref <1.2)
Total Protein: 7.7 g/dL (ref 6.5–8.1)

## 2022-12-29 NOTE — Telephone Encounter (Signed)
PET and MRI have been approved.  Pt wants to call to schedule due to travel plans.   CT scan cancelled STAT Referral faxed to [facility] received.       Previous Messages    ----- Message ----- From: Pollyann Samples, NP Sent: 12/29/2022   1:29 PM EST To: Edmonia Caprio, RN; Manning Charity; *  Team, This is a new anal SCC, please help get PET approved asap and replace the CT scheduled for 12/14 if possible. She does not need both and PET would be more helpful. I have also ordered pelvic MRI.  Beth, please add her to GI conference in the next 1-2 weeks.  My nurse, please help facilitate urgent referral to colorectal surgeon Dr. Cliffton Asters, Dr. Maisie Fus, Dr. Michaell Cowing etc. Will hold off on referring to rad onc until scans and surgery give their input.  Thanks, Clayborn Heron

## 2022-12-29 NOTE — Progress Notes (Cosign Needed)
[facility]   Telephone:([phone]) [phone] Fax:([phone]) [phone]   Clinic New Consult Note   Patient Care Team: Avanell Shackleton, NP-C as PCP - General (Family Medicine) Napoleon Form, MD as Consulting Physician (Gastroenterology) [facility] as Consulting Physician (Ophthalmology) 12/29/2022  CHIEF COMPLAINTS/PURPOSE OF CONSULTATION:  Anal cancer, referred by GI Dr. Lavon Paganini   History of Present Illness    Brittany Summers is a 67 year old female with PMH including HTN, HL, osteopenia, vitamin D deficiency, aortic atherosclerosis, and emphysema is here for newly diagnosed anal cancer.  Under close surveillance for history of multiple adenomas, she underwent routine colonoscopy by Dr. Lavon Paganini 12/21/2022 showing 7 polyps in the ascending and transverse colon as well as a 15 mm ulcerated polypoid lesion at the anus which appeared infiltrative.  Path revealed 4 sessile serrated polyps, 3 tubular adenomas, and the rectal biopsy positive for invasive moderately differentiated squamous cell carcinoma.  Staging CT CAP scheduled for 01/01/2023.  Socially, she is married with stepchildren, no biological children.  She is retired from [employer] and an [employer].  She is independent with ADLs.  Up-to-date on age-appropriate cancer screenings.  Drinks alcohol rarely, no recent drug use.  Quit smoking cigarettes in 2017 after 40 years at half pack per day.  Family history significant for a mother with breast cancer in her 58s, maternal uncle with colon cancer in his 56s, maternal grandmother and maternal great grandmother with stomach cancer, and a father with leukemia in his 40s.  Today she presents by herself, husband and stepdaughter are in the lobby.  Her husband has dementia she has not told him much yet.  She had gallbladder out in July and recovered well from surgery, feels great in general with no changes.  Denies change in bowel habits, rectal  bleeding or pain, unintentional weight loss, or fatigue.        MEDICAL HISTORY:  Past Medical History:  Diagnosis Date   Chronic hepatitis C without hepatic coma (HCC) 09/22/2016   finished treatment for this   Gallstone    known with h/o cholecystitis pain, no recent recurrence   Hypertension    no meds taken    SURGICAL HISTORY: Past Surgical History:  Procedure Laterality Date   BREAST BIOPSY Left 2017   CHOLECYSTECTOMY N/A 08/04/2022   Procedure: LAPAROSCOPIC CHOLECYSTECTOMY;  Surgeon: Almond Lint, MD;  Location: [facility] OR;  Service: General;  Laterality: N/A;   COLONOSCOPY     MAXILLARY SINUS LIFT     MOUTH SURGERY     ORIF ANKLE FRACTURE Left 07/06/2018   Procedure: OPEN REDUCTION INTERNAL FIXATION (ORIF) left ankle trimalleolar fracture;  Surgeon: Toni Arthurs, MD;  Location: [facility];  Service: Orthopedics;  Laterality: Left;    TONSILLECTOMY     age 43   UPPER GASTROINTESTINAL ENDOSCOPY      SOCIAL HISTORY: Social History   Socioeconomic History   Marital status: Married    Spouse name: Not on file   Number of children: 0   Years of education: Not on file   Highest education level: Not on file  Occupational History   Occupation: retired  Tobacco Use   Smoking status: Former    Current packs/day: 0.00    Types: Cigarettes    Quit date: 07/15/2015    Years since quitting: 7.4   Smokeless tobacco: Never  Vaping Use   Vaping status: Never Used  Substance and Sexual Activity   Alcohol use: No  Alcohol/week: 0.0 standard drinks of alcohol    Comment: as of 04-28-16 no alcohol    Drug use: No   Sexual activity: Not Currently    Partners: Male  Other Topics Concern   Not on file  Social History Narrative   Not on file   Social Determinants of Health   Financial Resource Strain: Low Risk  (03/11/2022)   Overall Financial Resource Strain (CARDIA)    Difficulty of Paying Living Expenses: Not hard at all  Food Insecurity: No Food  Insecurity (03/11/2022)   Hunger Vital Sign    Worried About Running Out of Food in the Last Year: Never true    Ran Out of Food in the Last Year: Never true  Transportation Needs: No Transportation Needs (03/11/2022)   PRAPARE - Administrator, Civil Service (Medical): No    Lack of Transportation (Non-Medical): No  Physical Activity: Sufficiently Active (03/11/2022)   Exercise Vital Sign    Days of Exercise per Week: 5 days    Minutes of Exercise per Session: 30 min  Stress: No Stress Concern Present (03/11/2022)   [employer] of Occupational Health - Occupational Stress Questionnaire    Feeling of Stress : Not at all  Social Connections: Socially Integrated (03/11/2022)   Social Connection and Isolation Panel [NHANES]    Frequency of Communication with Friends and Family: More than three times a week    Frequency of Social Gatherings with Friends and Family: More than three times a week    Attends Religious Services: More than 4 times per year    Active Member of [employer] or Organizations: Yes    Attends Engineer, structural: More than 4 times per year    Marital Status: Married  Catering manager Violence: Not At Risk (03/11/2022)   Humiliation, Afraid, Rape, and Kick questionnaire    Fear of Current or Ex-Partner: No    Emotionally Abused: No    Physically Abused: No    Sexually Abused: No    FAMILY HISTORY: Family History  Problem Relation Age of Onset   Hypertension Mother    Breast cancer Mother        Dx in her early 31's   Hypertension Father    Diabetes Father    Leukemia Father    Colon cancer Maternal Uncle 80 - 79   Breast cancer Maternal Grandmother    Stomach cancer Maternal Grandmother 46   Stomach cancer Maternal Great-grandmother    Colon polyps Neg Hx    Rectal cancer Neg Hx    Esophageal cancer Neg Hx     ALLERGIES:  is allergic to penicillins.  MEDICATIONS:  Current Outpatient Medications  Medication Sig Dispense Refill    atorvastatin (LIPITOR) 10 MG tablet Take 1 tablet (10 mg total) by mouth daily. 90 tablet 3   Cholecalciferol (VITAMIN D3) 50 MCG (2000 UT) TABS Take 2,000 Units by mouth in the morning and at bedtime.     No current facility-administered medications for this visit.   Facility-Administered Medications Ordered in Other Visits  Medication Dose Route Frequency Provider Last Rate Last Admin   indocyanine green (IC-GREEN) injection 25 mg  25 mg Topical Once Almond Lint, MD        REVIEW OF SYSTEMS:   Constitutional: Denies fatigue, fevers, unintentional weight loss, chills or abnormal night sweats Eyes: Denies blurriness of vision, double vision or watery eyes Ears, nose, mouth, throat, and face: Denies mucositis or sore throat Respiratory: Denies cough, dyspnea  or wheezes Cardiovascular: Denies palpitation, chest discomfort or lower extremity swelling Gastrointestinal:  Denies nausea, heartburn hematochezia, pain, or change in bowel habits Skin: Denies abnormal skin rashes Lymphatics: Denies new lymphadenopathy or easy bruising Neurological:Denies numbness, tingling or new weaknesses Behavioral/Psych: Mood is stable, no new changes  All other systems were reviewed with the patient and are negative.  PHYSICAL EXAMINATION: ECOG PERFORMANCE STATUS: 0 - Asymptomatic  Vitals:   12/29/22 1227 12/29/22 1232  BP: (!) 171/87 (!) 155/72  Pulse: 85   Resp: 15   Temp: 98.3 F (36.8 C)   SpO2: 100%    Filed Weights   12/29/22 1227  Weight: 131 lb 14.4 oz (59.8 kg)    GENERAL:alert, no distress and comfortable SKIN: no rashes or significant lesions EYES: sclera clear NECK: Without mass LYMPH:  no palpable cervical, supraclavicular, or inguinal lymphadenopathy LUNGS: clear with normal breathing effort HEART: regular rate & rhythm, no lower extremity edema ABDOMEN:abdomen soft, non-tender and normal bowel sounds. Rectal exam with firm flat/mildly nodular 1-1.5 cm lesion at 6 - 9 o'clock.  Normal sphincter tone. No blood on glove Musculoskeletal:no cyanosis of digits and no clubbing  PSYCH: alert & oriented x 3 with fluent speech NEURO: no focal motor/sensory deficits   LABORATORY DATA:  I have reviewed the data as listed    Latest Ref Rng & Units 12/29/2022    1:26 PM 07/28/2022    1:33 PM 03/18/2022   11:47 AM  CBC  WBC 4.0 - 10.5 K/uL 10.1  10.1  9.9   Hemoglobin 12.0 - 15.0 g/dL 08.6  57.8  46.9   Hematocrit 36.0 - 46.0 % 40.7  37.7  41.8   Platelets 150 - 400 K/uL 295  268  292.0        Latest Ref Rng & Units 07/28/2022    1:33 PM 05/06/2022   10:29 AM 03/18/2022   11:47 AM  CMP  Glucose 70 - 99 mg/dL 629  97  89   BUN 8 - 23 mg/dL 10  11  12    Creatinine 0.44 - 1.00 mg/dL 5.28  4.13  2.44   Sodium 135 - 145 mmol/L 138  140  138   Potassium 3.5 - 5.1 mmol/L 4.3  4.5  5.1   Chloride 98 - 111 mmol/L 98  103  101   CO2 22 - 32 mmol/L 28  29  29    Calcium 8.9 - 10.3 mg/dL 9.6  9.5  01.0   Total Protein 6.5 - 8.1 g/dL 7.0  7.0  7.3   Total Bilirubin 0.3 - 1.2 mg/dL 0.8  0.6  0.6   Alkaline Phos 38 - 126 U/L 49  49  49   AST 15 - 41 U/L 19  17  17    ALT 0 - 44 U/L 20  15  12       RADIOGRAPHIC STUDIES: I have personally reviewed the radiological images as listed and agreed with the findings in the report. No results found.  ASSESSMENT & PLAN: 67 yo female   Anal cancer, squamous cell carcinoma, TxNxMx, P16+ -We reviewed her colonoscopy and biopsy with pt in detail, which shows a small mass at the most distal rectum, path confirmed SCC -Exam shows a nodular mass just above the anal verge -HIV negative in 2018 -Staging CT CAP scheduled 12/14 but will try to get a PET scan in lieu of CT -We discussed due to the small size, if PET is negative, this may be amenable to  endoscopic surgical resection. Will place urgent referral to colorectal surgery. -Will proceed with pelvic MRI to help with surgical planning -If surgery is not offered, or she has positive  margin /residual these, we would recommend a course of curative chemoradiation.  Briefly discussed logistics and side effects. Will refer her to Dr. Mitzi Hansen if necessary -She is otherwise healthy with good PS, she is open to ccRT and would likely tolerate chemo well. She is the primary caregiver for her elderly husband who has dementia although she does have family and other available support if she needed chemoRT -If she needs chemoRT, will refer her to chemo class, SW, PT for pelvic floor exercise, etc. -Plan for lab today and tumor board discussion, will update patient with imaging results and next steps.    PLAN: -Medical record, colonoscopy, and path reviewed -Labs today, then PET and pelvic MRI in the next week -Urgent referral to colorectal surgery for their opinion -Follow-up pending imaging results and tumor board discussion -Pt seen with Dr. Mosetta Putt    Orders Placed This Encounter  Procedures   NM PET Image Initial (PI) Skull Base To Thigh    Standing Status:   Future    Standing Expiration Date:   12/29/2023    Order Specific Question:   If indicated for the ordered procedure, I authorize the administration of a radiopharmaceutical per Radiology protocol    Answer:   Yes    Order Specific Question:   Preferred imaging location?    Answer:   [facility]   MR PELVIS WO CM RECTAL CA STAGING    Standing Status:   Future    Standing Expiration Date:   12/29/2023    Order Specific Question:   If indicated for the ordered procedure, I authorize the administration of contrast media per Radiology protocol    Answer:   Yes    Order Specific Question:   What is the patient's sedation requirement?    Answer:   No Sedation    Order Specific Question:   Does the patient have a pacemaker or implanted devices?    Answer:   No    Order Specific Question:   Preferred imaging location?    Answer:   [facility] (table limit - 550 lbs)   CBC with Differential (Cancer Center Only)     Standing Status:   Future    Number of Occurrences:   1    Standing Expiration Date:   12/29/2023   CMP (Cancer Center only)    Standing Status:   Future    Number of Occurrences:   1    Standing Expiration Date:   12/29/2023   Ambulatory referral to Colorectal Surgery    Referral Priority:   Urgent    Referral Type:   Surgical    Referral Reason:   Specialty Services Required    Requested Specialty:   General Surgery    Number of Visits Requested:   1    All questions were answered. The patient knows to call the clinic with any problems, questions or concerns.      Brittany Samples, NP 12/29/2022   Addendum I have seen the patient, examined her. I agree with the assessment and and plan and have edited the notes.   67 yo female with treated hep c and hypertension, presented with screening colonoscopy discovered anal cancer. She is asymptomatic. The tumor is about 1.5cm.  I recommend a staging PET scan to rule out node and  distant metastasis, and a pelvic MRI to evaluate the depth of the tumor to see if she is a candidate for endoscopic mucosal resection (EMR).  Will refer her to colorectal surgeon.  We also discussed definitive therapy with concurrent chemoradiation, if she is not a candidate for EMR.  Patient is in good health overall, and would be a candidate for definitive chemo and radiation with mitomycin and 5-FU regiment.  All questions were answered.  I spent a total of 45 minutes for her visit today, more than 50% time on face-to-face counseling.  Will present her case in GI tumor conference.  Malachy Mood MD 12/30/2022

## 2023-01-01 ENCOUNTER — Ambulatory Visit (HOSPITAL_COMMUNITY): Payer: Medicare Other

## 2023-01-01 ENCOUNTER — Encounter (HOSPITAL_COMMUNITY): Payer: Self-pay

## 2023-01-04 ENCOUNTER — Ambulatory Visit (HOSPITAL_COMMUNITY)
Admission: RE | Admit: 2023-01-04 | Discharge: 2023-01-04 | Disposition: A | Discharge status: Home / Self Care | Payer: Medicare Other | Source: Ambulatory Visit | Attending: Nurse Practitioner | Admitting: Nurse Practitioner

## 2023-01-04 DIAGNOSIS — C210 Malignant neoplasm of anus, unspecified: Secondary | ICD-10-CM | POA: Insufficient documentation

## 2023-01-05 ENCOUNTER — Telehealth: Payer: Self-pay

## 2023-01-05 ENCOUNTER — Encounter: Payer: Self-pay | Admitting: Nurse Practitioner

## 2023-01-05 NOTE — Telephone Encounter (Signed)
Pt returned call.  Informed pt that her MRI showed superficial anal cancer with no lymph node involvement.  Stated currently Dr. Maisie Fus does not think she can do the pt's resection and would like for the pt to have the PET Scan done for a better view which Dr. Maisie Fus can then determine if the resection can be done.  Stated the pt is currently scheduled on 01/17/2023 for the PET Scan which Lacie and Dr. Maisie Fus would like for the pt to keep this appt.  Pt verbalized understanding.  Stated pt is scheduled for an appt with Dr. Maisie Fus on 01/24/2023 at which time Dr. Maisie Fus will discuss with the pt the results of the PET Scan and if the pt is a candidate for resection.  Stated Lacie wants to hold off on referring pt to radiation until after the pt meets with Dr. Maisie Fus on 01/24/2023.  Stated pt will have a f/u appt with Dr. Mosetta Putt later that week to discuss the pt's treatment plan/plan of care.  Pt verbalized understanding and had no further questions or concerns at this time.

## 2023-01-05 NOTE — Telephone Encounter (Signed)
LVM requesting pt to contact Zachery Conch, NP office regarding outcome of GI Tumor Board discussion.  Requested for pt to contact Lacie Burton's office (provided the telephone number).  Awaiting return telephone call.

## 2023-01-06 ENCOUNTER — Telehealth: Payer: Self-pay | Admitting: Nurse Practitioner

## 2023-01-06 ENCOUNTER — Other Ambulatory Visit: Payer: Self-pay

## 2023-01-07 ENCOUNTER — Other Ambulatory Visit: Payer: Self-pay | Admitting: *Deleted

## 2023-01-07 NOTE — Progress Notes (Signed)
The proposed treatment discussed in conference is for discussion purpose only and is not a binding recommendation.  The patients have not been physically examined, or presented with their treatment options.  Therefore, final treatment plans cannot be decided.  

## 2023-01-17 ENCOUNTER — Encounter (HOSPITAL_COMMUNITY)
Admission: RE | Admit: 2023-01-17 | Discharge: 2023-01-17 | Disposition: A | Discharge status: Home / Self Care | Payer: Medicare Other | Source: Ambulatory Visit | Attending: Nurse Practitioner | Admitting: Nurse Practitioner

## 2023-01-17 DIAGNOSIS — C210 Malignant neoplasm of anus, unspecified: Secondary | ICD-10-CM | POA: Diagnosis present

## 2023-01-17 LAB — GLUCOSE, CAPILLARY: Glucose-Capillary: 107 mg/dL — ABNORMAL HIGH (ref 70–99)

## 2023-01-17 MED ORDER — FLUDEOXYGLUCOSE F - 18 (FDG) INJECTION
6.5000 | Freq: Once | INTRAVENOUS | Status: AC
  Administered 2023-01-17: 6.61 via INTRAVENOUS

## 2023-01-20 ENCOUNTER — Telehealth: Payer: Self-pay

## 2023-01-20 NOTE — Telephone Encounter (Addendum)
 Called radiology to have the scan report read for the upcoming appointment.    ----- Message from Lacie K Burton sent at 01/20/2023 12:08 PM EST ----- Please ask radiology to sign out the PET report Consult with Dr. Debby 1/6 and she will want the results.   Thanks Lacie

## 2023-01-23 NOTE — Progress Notes (Addendum)
 Patient Care Team: Lendia Boby CROME, NP-C as PCP - General (Family Medicine) Nandigam, Kavitha V, MD as Consulting Physician (Gastroenterology) [facility] as Consulting Physician (Ophthalmology)   CHIEF COMPLAINT: Follow up anal cancer   CURRENT THERAPY: PENDING ccRT  INTERVAL HISTORY Ms. Brittany Summers returns for follow up as scheduled, last seen as new pt 12/29/22. She has undergone pelvic MRI, PET scan, and surgical consult in the interval.  Doing well overall, with good energy and appetite.  She walked here from her home which is several blocks away.  Denies bowel issues, rectal pain, or bleeding.  ROS  All other systems reviewed and negative  Past Medical History:  Diagnosis Date   Chronic hepatitis C without hepatic coma (HCC) 09/22/2016   finished treatment for this   Gallstone    known with h/o cholecystitis pain, no recent recurrence   Hypertension    no meds taken     Past Surgical History:  Procedure Laterality Date   BREAST BIOPSY Left 2017   CHOLECYSTECTOMY N/A 08/04/2022   Procedure: LAPAROSCOPIC CHOLECYSTECTOMY;  Surgeon: Aron Shoulders, MD;  Location: [facility] OR;  Service: General;  Laterality: N/A;   COLONOSCOPY     MAXILLARY SINUS LIFT     MOUTH SURGERY     ORIF ANKLE FRACTURE Left 07/06/2018   Procedure: OPEN REDUCTION INTERNAL FIXATION (ORIF) left ankle trimalleolar fracture;  Surgeon: Kit Rush, MD;  Location: [facility];  Service: Orthopedics;  Laterality: Left;    TONSILLECTOMY     age 34   UPPER GASTROINTESTINAL ENDOSCOPY       Outpatient Encounter Medications as of 01/25/2023  Medication Sig   atorvastatin  (LIPITOR) 10 MG tablet Take 1 tablet (10 mg total) by mouth daily.   Cholecalciferol (VITAMIN D3) 50 MCG (2000 UT) TABS Take 2,000 Units by mouth in the morning and at bedtime.   Facility-Administered Encounter Medications as of 01/25/2023  Medication   indocyanine green  (IC-GREEN ) injection 25 mg     Today's  Vitals   01/25/23 1043 01/25/23 1044  BP: 131/62   Pulse: 91   Resp: 16   Temp: 98.2 F (36.8 C)   TempSrc: Temporal   SpO2: 99%   Weight: 133 lb 11.2 oz (60.6 kg)   Height: 5' (1.524 m)   PainSc:  0-No pain   Body mass index is 26.11 kg/m.   PHYSICAL EXAM GENERAL:alert, no distress and comfortable SKIN: no rash  EYES: sclera clear LUNGS: normal breathing effort NEURO: alert & oriented x 3 with fluent speech   CBC    Component Value Date/Time   WBC 9.6 01/25/2023 0951   WBC 10.1 07/28/2022 1333   RBC 4.46 01/25/2023 0951   HGB 13.6 01/25/2023 0951   HGB 13.5 09/26/2019 1102   HCT 40.7 01/25/2023 0951   HCT 40.6 09/26/2019 1102   PLT 297 01/25/2023 0951   PLT 311 09/26/2019 1102   MCV 91.3 01/25/2023 0951   MCV 90 09/26/2019 1102   MCH 30.5 01/25/2023 0951   MCHC 33.4 01/25/2023 0951   RDW 13.1 01/25/2023 0951   RDW 12.2 09/26/2019 1102   LYMPHSABS 2.0 01/25/2023 0951   LYMPHSABS 2.3 06/08/2017 1636   MONOABS 0.9 01/25/2023 0951   EOSABS 0.2 01/25/2023 0951   EOSABS 0.1 06/08/2017 1636   BASOSABS 0.1 01/25/2023 0951   BASOSABS 0.0 06/08/2017 1636     CMP     Component Value Date/Time   NA 139 01/25/2023 0951   NA  141 09/26/2019 1102   K 4.3 01/25/2023 0951   CL 102 01/25/2023 0951   CO2 28 01/25/2023 0951   GLUCOSE 96 01/25/2023 0951   BUN 14 01/25/2023 0951   BUN 9 09/26/2019 1102   CREATININE 0.57 01/25/2023 0951   CREATININE 0.45 (L) 09/22/2016 1350   CALCIUM  10.2 01/25/2023 0951   PROT 7.7 01/25/2023 0951   PROT 7.1 09/26/2019 1102   ALBUMIN 4.6 01/25/2023 0951   ALBUMIN 4.7 09/26/2019 1102   AST 20 01/25/2023 0951   ALT 23 01/25/2023 0951   ALT 37 (H) 09/22/2016 1350   ALKPHOS 50 01/25/2023 0951   BILITOT 0.7 01/25/2023 0951   GFRNONAA >60 01/25/2023 0951   GFRNONAA 107 09/22/2016 1350   GFRAA 113 09/26/2019 1102   GFRAA 124 09/22/2016 1350     ASSESSMENT & PLAN:68 yo female    Anal cancer, squamous cell carcinoma, TxNxMx,  P16+ -We reviewed her colonoscopy and biopsy with pt in detail, which shows a small mass at the most distal rectum, path confirmed SCC -Exam shows a nodular mass just above the anal verge -HIV negative in 2018 -12/29/2022 briefly discussed upfront surgery vs definitive concurrent chemoradiation with Mitomycin /5FU -Pelvic MRI 01/04/2023 showed slight asymmetric nodular thickening along the left side of the upper anal region, without definite extension through the internal sphincter, no abnormal lymph nodes -PET scan/30/24 was negative for lymphadenopathy or distant metastasis  -Surgical consult with Dr. Debby 01/24/23, appears to be fixed to the underlying structures does not appear to be surgically resectable with negative margins and recommends proceeding with chemoradiation  -Ms. Alperin appears well, she is asymptomatic, labs are normal.  She is otherwise healthy and active with great PS and would be a good candidate for definitive chemoradiation with mitomycin  on days 1 and 28, 5FU days 1 - 5 and 29 - 32.  -Chemotherapy consent: Side effects including but not limited to fatigue, nausea, vomiting, diarrhea, hair thinning/loss, mucositis, neuropathy, fluid retention, renal and kidney dysfunction, neutropenic fever, need for blood transfusion, bleeding, were discussed with patient in great detail. She agrees to proceed. Goal is curative -Will refer her to SW to help with husband's care while she undergoes treatment, chemo edu class, PT for pelvic floor, and to see Dr. Smitty team -PICC placement, lab, and f/up with D1    PLAN: -MRI, PET, labs, and Dr. Lus notes reviewed -Consented to anal protocol chemoradiation, goal is curative -Referral to Dr. Dewey, SW, chemo ed, and pelvic floor PT -PICC placement, lab, and f/up with C1D1 likely ~1/20 -Pt seen with Dr. Lanny via FaceTime   Orders Placed This Encounter  Procedures   IR Fluoro Guide CV Line Left    Indicate type of CVC ordering     Standing Status:   Future    Expected Date:   02/07/2023    Expiration Date:   01/25/2024    Reason for exam::   durable access for chemo    Preferred Imaging Location?:   [facility]   Ambulatory referral to Social Work    Referral Priority:   Urgent    Referral Type:   Consultation    Referral Reason:   Specialty Services Required    Number of Visits Requested:   1   Ambulatory referral to Radiation Oncology    Referral Priority:   Urgent    Referral Type:   Consultation    Referral Reason:   Specialty Services Required    Number of Visits Requested:  1   Ambulatory referral to Physical Therapy    Referral Priority:   Routine    Referral Type:   Physical Medicine    Referral Reason:   Specialty Services Required    Requested Specialty:   Physical Therapy    Number of Visits Requested:   1      All questions were answered. The patient knows to call the clinic with any problems, questions or concerns. No barriers to learning were detected.  Jemila Camille, NP-C 01/25/2023  Addendum I have seen the patient, examined her. I agree with the assessment and and plan and have edited the notes.   Patient has met colorectal surgeon Dr. Debby, and felt not to be a candidate for endoscopy resection.  I reviewed her pelvic MRI and a PET scan images, which both showed very early stage anal cancer, no nodal or distant metastasis.  We recommend concurrent chemoradiation with mitomycin  and 5-FU on first and fifth week of radiation.  Chemo consent obtained.  All questions were answered.  We made a urgent referral to radiation oncologist Dr. Dewey today.  Will set up PICC line placement, chemo class, and refer her to social work for support.  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 Will see her back on first day of treatment.  I spent a total of 40 minutes for her visit today, more than 50% time on face-to-face counseling.  Onita Mattock MD 01/25/2023

## 2023-01-25 ENCOUNTER — Other Ambulatory Visit: Payer: Self-pay | Admitting: Nurse Practitioner

## 2023-01-25 ENCOUNTER — Inpatient Hospital Stay (HOSPITAL_BASED_OUTPATIENT_CLINIC_OR_DEPARTMENT_OTHER): Payer: Medicare Other | Admitting: Nurse Practitioner

## 2023-01-25 ENCOUNTER — Inpatient Hospital Stay: Payer: Medicare Other | Admitting: Licensed Clinical Social Worker

## 2023-01-25 ENCOUNTER — Inpatient Hospital Stay: Payer: Medicare Other

## 2023-01-25 ENCOUNTER — Encounter: Payer: Self-pay | Admitting: Nurse Practitioner

## 2023-01-25 VITALS — BP 131/62 | HR 91 | Temp 98.2°F | Resp 16 | Ht 60.0 in | Wt 133.7 lb

## 2023-01-25 DIAGNOSIS — B182 Chronic viral hepatitis C: Secondary | ICD-10-CM | POA: Insufficient documentation

## 2023-01-25 DIAGNOSIS — C210 Malignant neoplasm of anus, unspecified: Secondary | ICD-10-CM

## 2023-01-25 DIAGNOSIS — Z5111 Encounter for antineoplastic chemotherapy: Secondary | ICD-10-CM | POA: Insufficient documentation

## 2023-01-25 DIAGNOSIS — I10 Essential (primary) hypertension: Secondary | ICD-10-CM | POA: Insufficient documentation

## 2023-01-25 DIAGNOSIS — Z510 Encounter for antineoplastic radiation therapy: Secondary | ICD-10-CM | POA: Diagnosis present

## 2023-01-25 LAB — CBC WITH DIFFERENTIAL (CANCER CENTER ONLY)
Abs Immature Granulocytes: 0.03 10*3/uL (ref 0.00–0.07)
Basophils Absolute: 0.1 10*3/uL (ref 0.0–0.1)
Basophils Relative: 1 %
Eosinophils Absolute: 0.2 10*3/uL (ref 0.0–0.5)
Eosinophils Relative: 2 %
HCT: 40.7 % (ref 36.0–46.0)
Hemoglobin: 13.6 g/dL (ref 12.0–15.0)
Immature Granulocytes: 0 %
Lymphocytes Relative: 21 %
Lymphs Abs: 2 10*3/uL (ref 0.7–4.0)
MCH: 30.5 pg (ref 26.0–34.0)
MCHC: 33.4 g/dL (ref 30.0–36.0)
MCV: 91.3 fL (ref 80.0–100.0)
Monocytes Absolute: 0.9 10*3/uL (ref 0.1–1.0)
Monocytes Relative: 10 %
Neutro Abs: 6.3 10*3/uL (ref 1.7–7.7)
Neutrophils Relative %: 66 %
Platelet Count: 297 10*3/uL (ref 150–400)
RBC: 4.46 MIL/uL (ref 3.87–5.11)
RDW: 13.1 % (ref 11.5–15.5)
WBC Count: 9.6 10*3/uL (ref 4.0–10.5)
nRBC: 0 % (ref 0.0–0.2)

## 2023-01-25 LAB — CMP (CANCER CENTER ONLY)
ALT: 23 U/L (ref 0–44)
AST: 20 U/L (ref 15–41)
Albumin: 4.6 g/dL (ref 3.5–5.0)
Alkaline Phosphatase: 50 U/L (ref 38–126)
Anion gap: 9 (ref 5–15)
BUN: 14 mg/dL (ref 8–23)
CO2: 28 mmol/L (ref 22–32)
Calcium: 10.2 mg/dL (ref 8.9–10.3)
Chloride: 102 mmol/L (ref 98–111)
Creatinine: 0.57 mg/dL (ref 0.44–1.00)
GFR, Estimated: >60 mL/min (ref >60)
Glucose, Bld: 96 mg/dL (ref 70–99)
Potassium: 4.3 mmol/L (ref 3.5–5.1)
Sodium: 139 mmol/L (ref 135–145)
Total Bilirubin: 0.7 mg/dL (ref 0.0–1.2)
Total Protein: 7.7 g/dL (ref 6.5–8.1)

## 2023-01-25 NOTE — Progress Notes (Signed)
 START ON PATHWAY REGIMEN - Anal Carcinoma     One cycle, concurrent with RT:     Fluorouracil      Mitomycin   **Always confirm dose/schedule in your pharmacy ordering system**  Patient Characteristics: Anal Canal Tumors, Newly Diagnosed - Locoregional Disease (Clinical Staging) Therapeutic Status: Newly Diagnosed - Locoregional Disease (Clinical Staging) AJCC T Category: cT1 AJCC N Category: cN0 AJCC M Category: cM0 AJCC 9 Stage Grouping: I Check here if patient was staged using an edition other than AJCC Staging 9th Edition: false Intent of Therapy: Curative Intent, Discussed with Patient

## 2023-01-25 NOTE — Progress Notes (Signed)
 [facility] Clinical Social Work  Clinical Social Work was referred by  LITTIE Silversmith, NP  for caregiving resources.  Clinical Social Worker contacted patient by phone to offer support and assess for needs.   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 She does total care needs including assistance with basic ADLs. Her husband's daughter is sometimes able to help when pt has an appointment for herself. Otherwise, pt is only able to leave for very short periods to run an errand with neighbors aware to help if needed.  Pt is not sure how her husband would respond to a stranger in the home to help him.    CSW shared information on organizations that can help talk through the options, so that pt is at least aware of what is available. Sent to pt via MyChart message. Pt will review and discuss with husband's daughter and their other two children to determine if the additional help is needed.  Reviewed additional support services and encouraged pt to call with any questions or further needs.   Maveric Debono E Alejandra Barna, LCSW  Clinical Social Worker [facility]

## 2023-01-26 ENCOUNTER — Other Ambulatory Visit: Payer: Self-pay

## 2023-01-26 ENCOUNTER — Encounter: Payer: Self-pay | Admitting: Hematology

## 2023-01-26 MED ORDER — ONDANSETRON HCL 8 MG PO TABS: 8.0000 mg | ORAL_TABLET | Freq: Three times a day (TID) | ORAL | 1 refills | Status: DC | PRN

## 2023-01-26 MED ORDER — PROCHLORPERAZINE MALEATE 10 MG PO TABS: 10.0000 mg | ORAL_TABLET | Freq: Four times a day (QID) | ORAL | 1 refills | Status: DC | PRN

## 2023-01-26 NOTE — Addendum Note (Signed)
 Addended by: Malachy Mood on: 01/26/2023 10:02 AM   Modules accepted: Orders

## 2023-01-27 ENCOUNTER — Other Ambulatory Visit: Payer: Self-pay

## 2023-01-27 ENCOUNTER — Inpatient Hospital Stay: Payer: Medicare Other

## 2023-01-28 ENCOUNTER — Other Ambulatory Visit: Payer: Self-pay

## 2023-01-28 NOTE — Progress Notes (Signed)
 GI Location of Tumor / Histology: Anal Cancer  Brittany Summers has been under surveillance for history of multiple adenomas.  She underwent routine colonoscopy that revealed 7 polyps in the ascending and transverse colon as well as a 15 mm ulcerated polypoid lesion at the anus which appeared infiltrative.   Biopsies of Anal Mass 12/21/2022    Past/Anticipated interventions by surgeon, if any:  Dr. Debby 01/24/2023 -On exam today this appears to be fixed to the underlying structures and does not appear to be surgically resectable with negative margins.  -I recommended proceeding with chemotherapy and radiation.  -I will be happy to see her back afterwards to follow this lesion post procedure.    Past/Anticipated interventions by medical oncology, if any:  Lacie Burton NP / Dr. Lanny 01/25/2023 -[name] appears well, she is asymptomatic, labs are normal.   -She is otherwise healthy and active with great PS and would be a good candidate for definitive chemoradiation with mitomycin  on days 1 and 28, 5FU days 1 - 5 and 29 - 32.    Weight changes, if any: None  Bowel/Bladder complaints, if any: None  Nausea / Vomiting, if any: No  Pain issues, if any:  No  Any blood per rectum: No    Abnormal Pap: No  SAFETY ISSUES: Prior radiation? No Pacemaker/ICD? No Possible current pregnancy? Postmenopausal Is the patient on methotrexate? No  Current Complaints/Details: PICC placement 02/04/2023

## 2023-01-31 ENCOUNTER — Other Ambulatory Visit: Payer: Self-pay

## 2023-01-31 NOTE — Progress Notes (Signed)
 Radiation Oncology         [phone] ________________________________  Name: Brittany Summers        MRN: [id number]  Date of Service: 02/01/2023 DOB: [birth year]  RR:Yzwdnw, Boby CROME, NP-C  Lanny Callander, MD     REFERRING PHYSICIAN: Lanny Callander, MD   DIAGNOSIS: The encounter diagnosis was Anal cancer [facility]).   HISTORY OF PRESENT ILLNESS: Brittany Summers is a 68 y.o. female seen at the request of Dr. Lanny for a newly diagnosed cancer involving the anus. The patient presented for routine colonoscopy screening with Dr. Frederich Kuba on 12/21/2022, she had not been experiencing melena or bright red blood per rectum or pain in the anal area. On colonoscopy however on 12/21/2022, perianal and digital exams were normal, and there was an 8 mm polyp in the ascending colon, 6 sessile polyps between the transverse and ascending colon measuring 3 to 5 mm in size as well as a 15 mm polypoid lesion found at the anus with ulcerated and raised edges appearing infiltrative.  Nonbleeding external and internal hemorrhoids were also noted.  Biopsies were taken, and consistent in the ascending colon with sessile serrated polyps without dysplasia, tubular adenomas were found in the transverse colon without any burdens of dysplasia or malignancy, and the biopsy and the lower rectum revealed invasive moderately differentiated squamous cell carcinoma.  She underwent imaging of the pelvis with MRI scan on 01/04/2023 which showed slight asymmetric nodular thickening along the left side of the upper anal region with restricted diffusion, no evidence of abnormal lymph node enlargement was noted in the pelvis and she had uterine fibroids measuring up to 4.1 cm.  A PET scan on 01/17/2023 showed no evidence of metastatic disease but showed hypermetabolic activity on the left wall of the anal canal posteriorly.  The patient has met with Dr. Debby and due to the tissues being somewhat fixed she does not feel like the patient is a surgically  resectable candidate and recommended chemoradiation.  She has also met with Dr. Lanny and is seen to discuss chemoradiation today.    PREVIOUS RADIATION THERAPY: No   PAST MEDICAL HISTORY:  Past Medical History:  Diagnosis Date   Chronic hepatitis C without hepatic coma (HCC) 09/22/2016   finished treatment for this   Gallstone    known with h/o cholecystitis pain, no recent recurrence   Hypertension    no meds taken       PAST SURGICAL HISTORY: Past Surgical History:  Procedure Laterality Date   BREAST BIOPSY Left 2017   CHOLECYSTECTOMY N/A 08/04/2022   Procedure: LAPAROSCOPIC CHOLECYSTECTOMY;  Surgeon: Aron Shoulders, MD;  Location: [facility] OR;  Service: General;  Laterality: N/A;   COLONOSCOPY     MAXILLARY SINUS LIFT     MOUTH SURGERY     ORIF ANKLE FRACTURE Left 07/06/2018   Procedure: OPEN REDUCTION INTERNAL FIXATION (ORIF) left ankle trimalleolar fracture;  Surgeon: Kit Rush, MD;  Location: [facility];  Service: Orthopedics;  Laterality: Left;    TONSILLECTOMY     age 77   UPPER GASTROINTESTINAL ENDOSCOPY       FAMILY HISTORY:  Family History  Problem Relation Age of Onset   Hypertension Mother    Breast cancer Mother        Dx in her early 66's   Hypertension Father    Diabetes Father    Leukemia Father    Colon cancer Maternal Uncle 8 - 79   Breast cancer Maternal  Grandmother    Stomach cancer Maternal Grandmother 59   Stomach cancer Maternal Great-grandmother    Colon polyps Neg Hx    Rectal cancer Neg Hx    Esophageal cancer Neg Hx      SOCIAL HISTORY:  reports that she quit smoking about 7 years ago. Her smoking use included cigarettes. She has never used smokeless tobacco. She reports that she does not drink alcohol and does not use drugs.  The patient is married and lives in [place].  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  She has support from her stepdaughter.   ALLERGIES: Penicillins   MEDICATIONS:   Current Outpatient Medications  Medication Sig Dispense Refill   atorvastatin  (LIPITOR) 10 MG tablet Take 1 tablet (10 mg total) by mouth daily. 90 tablet 3   Cholecalciferol (VITAMIN D3) 50 MCG (2000 UT) TABS Take 2,000 Units by mouth in the morning and at bedtime.     ondansetron  (ZOFRAN ) 8 MG tablet Take 1 tablet (8 mg total) by mouth every 8 (eight) hours as needed for nausea or vomiting. 30 tablet 1   prochlorperazine  (COMPAZINE ) 10 MG tablet Take 1 tablet (10 mg total) by mouth every 6 (six) hours as needed for nausea or vomiting. 30 tablet 1   No current facility-administered medications for this visit.   Facility-Administered Medications Ordered in Other Visits  Medication Dose Route Frequency Provider Last Rate Last Admin   indocyanine green  (IC-GREEN ) injection 25 mg  25 mg Topical Once Byerly, Faera, MD         REVIEW OF SYSTEMS: A complete review of systems was reviewed with the patient and pertinent positives findings are noted in the history of present illness.      PHYSICAL EXAM:  Wt Readings from Last 3 Encounters:  01/25/23 133 lb 11.2 oz (60.6 kg)  12/29/22 131 lb 14.4 oz (59.8 kg)  12/21/22 127 lb (57.6 kg)   Temp Readings from Last 3 Encounters:  01/25/23 98.2 F (36.8 C) (Temporal)  12/29/22 98.3 F (36.8 C) (Temporal)  12/21/22 99.1 F (37.3 C)   BP Readings from Last 3 Encounters:  01/25/23 131/62  12/29/22 (!) 155/72  12/21/22 121/73   Pulse Readings from Last 3 Encounters:  01/25/23 91  12/29/22 85  12/21/22 66     In general this is a well appearing Caucasian female in no acute distress. She's alert and oriented x4 and appropriate throughout the examination. Cardiopulmonary assessment is negative for acute distress and she exhibits normal effort.  On digital rectal exam, a palpable tumor is present at the proximal aspect of the anal canal on the left.     ECOG = 1  0 - Asymptomatic (Fully active, able to carry on all predisease  activities without restriction)  1 - Symptomatic but completely ambulatory (Restricted in physically strenuous activity but ambulatory and able to carry out work of a light or sedentary nature. For example, light housework, office work)  2 - Symptomatic, <50% in bed during the day (Ambulatory and capable of all self care but unable to carry out any work activities. Up and about more than 50% of waking hours)  3 - Symptomatic, >50% in bed, but not bedbound (Capable of only limited self-care, confined to bed or chair 50% or more of waking hours)  4 - Bedbound (Completely disabled. Cannot carry on any self-care. Totally confined to bed or chair)  5 - Death   Raylene MM, Creech RH, Tormey DC, et al. (  1982). Toxicity and response criteria of the [facility]. Am. DOROTHA Bridges. Oncol. 5 (6): 649-55    LABORATORY DATA:  Lab Results  Component Value Date   WBC 9.6 01/25/2023   HGB 13.6 01/25/2023   HCT 40.7 01/25/2023   MCV 91.3 01/25/2023   PLT 297 01/25/2023   Lab Results  Component Value Date   NA 139 01/25/2023   K 4.3 01/25/2023   CL 102 01/25/2023   CO2 28 01/25/2023   Lab Results  Component Value Date   ALT 23 01/25/2023   AST 20 01/25/2023   GGT 48 09/22/2016   ALKPHOS 50 01/25/2023   BILITOT 0.7 01/25/2023      RADIOGRAPHY: NM PET Image Initial (PI) Skull Base To Thigh Result Date: 01/20/2023 CLINICAL DATA:  Initial treatment strategy for anal carcinoma. EXAM: NUCLEAR MEDICINE PET SKULL BASE TO THIGH TECHNIQUE: 6.6 mCi F-18 FDG was injected intravenously. Full-ring PET imaging was performed from the skull base to thigh after the radiotracer. CT data was obtained and used for attenuation correction and anatomic localization. Fasting blood glucose: 107 mg/dl COMPARISON:  MRI 87/82/7975 FINDINGS: Choose one NECK: No hypermetabolic lymph nodes in the neck. Incidental CT findings: None. CHEST: No hypermetabolic mediastinal or hilar nodes. No suspicious pulmonary  nodules on the CT scan. Incidental CT findings: None. ABDOMEN/PELVIS: No clear abnormal metabolic activity within the anal canal. There is mild asymmetric metabolic activity along the posterior LEFT wall of the canal SUV max equal 4.1. No hypermetabolic pelvic or inguinal lymph nodes No abnormal hypermetabolic activity within the liver, pancreas, adrenal glands, or spleen. No hypermetabolic lymph nodes in the abdomen or pelvis. Incidental CT findings: Postcholecystectomy Atherosclerotic calcification of the aorta. Calcified leiomyoma within the uterus. SKELETON: No focal hypermetabolic activity to suggest skeletal metastasis. Incidental CT findings: None. IMPRESSION: 1. No evidence of anal carcinoma metastasis. 2. Mild asymmetric metabolic activity within the canal is nonspecific Electronically Signed   By: Jackquline Boxer M.D.   On: 01/20/2023 13:23   MR PELVIS WO CM RECTAL CA STAGING Result Date: 01/04/2023 CLINICAL DATA:  Anal lesion. EXAM: MRI PELVIS WITHOUT CONTRAST TECHNIQUE: Multiplanar multisequence MR imaging of the pelvis was performed. No intravenous contrast was administered. Ultrasound gel was administered per rectum to optimize tumor evaluation. COMPARISON:  Colonoscope note reviewed in epic.  No prior MRI FINDINGS: By report there is an anal lesion. There is slight asymmetric thickening along the anal canal on the on the left side superiorly as seen on series 7, image 33 measuring 7 mm. There is some subtle restricted diffusion in this location, dark on ADC map. Please correlate for the exact location of abnormality on colonoscopy. Please see coronal series 8, image 24. Slight bulging along the intersphincteric space. No extension through the external sphincter. No involvement extending above the anorectal junction. The rectum is distended with gel has a no other clear mass lesion. The visualized bowel elsewhere in the pelvis is nondilated. No specific abnormal lymph node enlargement identified  in the pelvis. Preserved contours of the underdistended urinary bladder. Uterine fibroids are identified measuring up to 4.1 by 3.2 cm. No marrow or soft tissue edema. IMPRESSION: Slight asymmetric nodular thickening along the left side of the upper anal region with some restricted diffusion. Please correlate with exact location of the abnormality on colonoscopy. There is no definitive extension through the entire thickness internal sphincter with only slight bulging of tissue along the intertrochanteric space. No abnormal lymph nodes identified in the pelvis. Electronically  Signed   By: Ranell Bring M.D.   On: 01/04/2023 11:33       IMPRESSION/PLAN: 1. Stage I, cT1N0M0, Squamous cell carcinoma of the anus.  Today we discussed the patient's workup today including her pathology results and imaging studies.  She appears to be a good candidate for chemoradiation as Dr. Debby does not feel that she is surgically resectable.  We discussed the course of chemoradiation with radiotherapy being daily over 5-1/2 weeks given that her lymph node status was negative.  We reviewed the risks, benefits, short and long-term effects of radiotherapy.  She is interested in proceeding.  We also discussed the curative intent. Written consent is obtained and placed in the chart, a copy was provided to the patient.  She will simulate today and we anticipate starting treatment on 02/07/2023. 2. 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 Medical oncology has already discussed the importance of evaluation with physical therapy prior to pelvic radiation. A referral was placed to physical therapy last week and we will give her vaginal dilators for when she has this post radiation education with physical therapy.   In a visit lasting 55 minutes, greater than 50% of the time was spent face to face discussing the patient's condition, in preparation for the discussion, and coordinating the patient's care.     ________________________________   Norleen CANDIE Limes, MD, PhD   **Disclaimer: This note was dictated with voice recognition software. Similar sounding words can inadvertently be transcribed and this note may contain transcription errors which may not have been corrected upon publication of note.**

## 2023-02-01 ENCOUNTER — Ambulatory Visit
Admission: RE | Admit: 2023-02-01 | Discharge: 2023-02-01 | Disposition: A | Discharge status: Home / Self Care | Payer: Medicare Other | Source: Ambulatory Visit | Attending: Radiation Oncology | Admitting: Radiation Oncology

## 2023-02-01 ENCOUNTER — Encounter: Payer: Self-pay | Admitting: Radiation Oncology

## 2023-02-01 VITALS — BP 151/86 | HR 108 | Temp 98.2°F | Resp 18 | Ht 60.0 in | Wt 133.2 lb

## 2023-02-01 DIAGNOSIS — I10 Essential (primary) hypertension: Secondary | ICD-10-CM | POA: Insufficient documentation

## 2023-02-01 DIAGNOSIS — I700 Atherosclerosis of aorta: Secondary | ICD-10-CM | POA: Insufficient documentation

## 2023-02-01 DIAGNOSIS — Z510 Encounter for antineoplastic radiation therapy: Secondary | ICD-10-CM | POA: Insufficient documentation

## 2023-02-01 DIAGNOSIS — Z87891 Personal history of nicotine dependence: Secondary | ICD-10-CM | POA: Insufficient documentation

## 2023-02-01 DIAGNOSIS — C210 Malignant neoplasm of anus, unspecified: Secondary | ICD-10-CM | POA: Insufficient documentation

## 2023-02-01 DIAGNOSIS — Z803 Family history of malignant neoplasm of breast: Secondary | ICD-10-CM | POA: Insufficient documentation

## 2023-02-01 DIAGNOSIS — Z800 Family history of malignant neoplasm of digestive organs: Secondary | ICD-10-CM | POA: Insufficient documentation

## 2023-02-01 DIAGNOSIS — K648 Other hemorrhoids: Secondary | ICD-10-CM | POA: Insufficient documentation

## 2023-02-01 DIAGNOSIS — Z79899 Other long term (current) drug therapy: Secondary | ICD-10-CM | POA: Insufficient documentation

## 2023-02-01 DIAGNOSIS — K644 Residual hemorrhoidal skin tags: Secondary | ICD-10-CM | POA: Diagnosis not present

## 2023-02-01 DIAGNOSIS — B182 Chronic viral hepatitis C: Secondary | ICD-10-CM | POA: Diagnosis not present

## 2023-02-02 ENCOUNTER — Other Ambulatory Visit: Payer: Self-pay

## 2023-02-02 ENCOUNTER — Other Ambulatory Visit (HOSPITAL_COMMUNITY): Payer: Self-pay | Admitting: Radiology

## 2023-02-03 ENCOUNTER — Ambulatory Visit: Payer: Medicare Other | Attending: Nurse Practitioner | Admitting: Physical Therapy

## 2023-02-03 DIAGNOSIS — R279 Unspecified lack of coordination: Secondary | ICD-10-CM | POA: Insufficient documentation

## 2023-02-03 DIAGNOSIS — M6281 Muscle weakness (generalized): Secondary | ICD-10-CM | POA: Insufficient documentation

## 2023-02-03 DIAGNOSIS — R293 Abnormal posture: Secondary | ICD-10-CM | POA: Diagnosis present

## 2023-02-03 DIAGNOSIS — C210 Malignant neoplasm of anus, unspecified: Secondary | ICD-10-CM | POA: Insufficient documentation

## 2023-02-03 NOTE — Therapy (Signed)
OUTPATIENT PHYSICAL THERAPY FEMALE PELVIC EVALUATION   Patient Name: Brittany Summers MRN: [id number] DOB:[birth year], 68 y.o., female Today's Date: 02/03/2023  END OF SESSION:  PT End of Session - 02/03/23 1107     Visit Number 1    Date for PT Re-Evaluation 08/03/23    Authorization Type MCR/medicaid    PT Start Time 1100    PT Stop Time 1142    PT Time Calculation (min) 42 min    Activity Tolerance Patient tolerated treatment well    Behavior During Therapy [facility] for tasks assessed/performed             Past Medical History:  Diagnosis Date   Chronic hepatitis C without hepatic coma (HCC) 09/22/2016   finished treatment for this   Gallstone    known with h/o cholecystitis pain, no recent recurrence   Hypertension    no meds taken   Past Surgical History:  Procedure Laterality Date   BREAST BIOPSY Left 2017   CHOLECYSTECTOMY N/A 08/04/2022   Procedure: LAPAROSCOPIC CHOLECYSTECTOMY;  Surgeon: Almond Lint, MD;  Location: [facility] OR;  Service: General;  Laterality: N/A;   COLONOSCOPY     MAXILLARY SINUS LIFT     MOUTH SURGERY     ORIF ANKLE FRACTURE Left 07/06/2018   Procedure: OPEN REDUCTION INTERNAL FIXATION (ORIF) left ankle trimalleolar fracture;  Surgeon: Toni Arthurs, MD;  Location: [facility];  Service: Orthopedics;  Laterality: Left;    TONSILLECTOMY     age 20   UPPER GASTROINTESTINAL ENDOSCOPY     Patient Active Problem List   Diagnosis Date Noted   Anal cancer (HCC) 01/25/2023   Stopped smoking with greater than 20 pack year history 05/06/2022   Pulmonary emphysema (HCC) 05/06/2022   Aortic atherosclerosis (HCC) 03/18/2022   Pure hypercholesterolemia 03/18/2022   Vitamin D deficiency 03/18/2022   Osteopenia after menopause 07/18/2017   Hypertension 06/21/2016    PCP: Hetty Blend, NP-C  REFERRING PROVIDER: Pollyann Samples, NP  REFERRING DIAG: C21.0 (ICD-10-CM) - Anal cancer (HCC)  THERAPY DIAG:  Muscle weakness  (generalized)  Abnormal posture  Unspecified lack of coordination  Rationale for Evaluation and Treatment: Rehabilitation  ONSET DATE: 12/2022  SUBJECTIVE:                                                                                                                                                                                           SUBJECTIVE STATEMENT: 10 treats of chemo and 28 radiation treatment plan as of now.   Fluid intake: Yes: water - trying to increase water to 64 oz; coffee in AM, usually one glass  of diet soda a day but has cut this out.     PAIN:  Are you having pain? No   PRECAUTIONS: Other: undergoing CA treatments starting next week    RED FLAGS: None   WEIGHT BEARING RESTRICTIONS: No  FALLS:  Has patient fallen in last 6 months? No  LIVING ENVIRONMENT: Lives with: lives with their spouse Lives in: House/apartment Stairs: Yes: Internal: 15 steps; can reach both Has following equipment at home: None  OCCUPATION: retired   PLOF: Independent  PATIENT GOALS: to stay active and strong  PERTINENT HISTORY:  Osteopenia, HTN, Pulmonary emphysema Sexual abuse: No  BOWEL MOVEMENT: Pain with bowel movement: No Type of bowel movement:Type (Bristol Stool Scale) 4, Frequency daily, and Strain No Fully empty rectum: Yes:   Leakage: No Pads: No Fiber supplement: No  URINATION: Pain with urination: No Fully empty bladder: Yes:   Stream: Strong Urgency: No Frequency: not quicker than every 2 hours, not always up at night sometimes once Leakage: Sneezing and vomiting  Pads: No  INTERCOURSE: Pain with intercourse:  not active, not painful Ability to have vaginal penetration:  Yes:   Climax: not painful Marinoff Scale: 0/3  PREGNANCY: Vaginal deliveries 0 Tearing No C-section deliveries 0 Currently pregnant No  PROLAPSE: None   OBJECTIVE:  Note: Objective measures were completed at Evaluation unless otherwise noted.  DIAGNOSTIC  FINDINGS:    COGNITION: Overall cognitive status: Within functional limits for tasks assessed     SENSATION: Light touch: Appears intact Proprioception: Appears intact  MUSCLE LENGTH: Bil hamstrings limited by 25%   FUNCTIONAL TESTS:  5 times sit to stand: 10.9s no AD Five times Sit to Stand Test (FTSS) Method: Use a straight back chair with a solid seat that is 16-18" high. Ask participant to sit on the chair with arms folded across their chest.   Instructions: "Stand up and sit down as quickly as possible 5 times, keeping your arms folded across your chest."   Measurement: Stop timing when the participant stands the 5th time.  TIME: ______ (in seconds)  Times > 13.6 seconds is associated with increased disability and morbidity (Guralnik, 2000) Times > 15 seconds is predictive of recurrent falls in healthy individuals aged 86 and older (Buatois, et al., 2008) Normal performance values in community dwelling individuals aged 76 and older (Bohannon, 2006): 60-69 years: 11.4 seconds 70-79 years: 12.6 seconds 80-89 years: 14.8 seconds  MCID: >= 2.3 seconds for Vestibular Disorders (Meretta, 2006)   GAIT: WFL no AD  POSTURE: rounded shoulders  PELVIC ALIGNMENT: WFL  LUMBARAROM/PROM:  A/PROM A/PROM  eval  Flexion WFL  Extension WFL  Right lateral flexion WFL  Left lateral flexion WFL  Right rotation Limited by 25%  Left rotation Limited by 25%   (Blank rows = not tested)  LOWER EXTREMITY ROM:  WFL  LOWER EXTREMITY MMT:  Bil hips grossly 4+/5; knees 5/5  PALPATION:   General  no TTP                External Perineal Exam mild redness at vulva and labias                             Internal Pelvic Floor no TTP  Patient confirms identification and approves PT to assess internal pelvic floor and treatment Yes No emotional/communication barriers or cognitive limitation. Patient is motivated to learn. Patient understands and agrees with treatment goals and  plan. PT explains patient  will be examined in standing, sitting, and lying down to see how their muscles and joints work. When they are ready, they will be asked to remove their underwear so PT can examine their perineum. The patient is also given the option of providing their own chaperone as one is not provided in our facility. The patient also has the right and is explained the right to defer or refuse any part of the evaluation or treatment including the internal exam. With the patient's consent, PT will use one gloved finger to gently assess the muscles of the pelvic floor, seeing how well it contracts and relaxes and if there is muscle symmetry. After, the patient will get dressed and PT and patient will discuss exam findings and plan of care. PT and patient discuss plan of care, schedule, attendance policy and HEP activities.  PELVIC MMT:   MMT eval  Vaginal 2/5 improved  to 3/5 with cues for techniques, 6s, 6 reps  Internal Anal Sphincter   External Anal Sphincter   Puborectalis   Diastasis Recti   (Blank rows = not tested)        TONE: Decreased   PROLAPSE: Mild anterior vaginal wall laxity with cough noted in hooklying   TODAY'S TREATMENT:                                                                                                                              DATE:   02/03/23 EVAL Examination completed, findings reviewed, pt educated on POC, HEP, energy conservation, walking program, vaginal dilators. Pt motivated to participate in PT and agreeable to attempt recommendations.     PATIENT EDUCATION:  Education details: [id number] Person educated: Patient Education method: Explanation, Demonstration, Tactile cues, Verbal cues, and Handouts Education comprehension: verbalized understanding and returned demonstration  HOME EXERCISE PROGRAM: [email or web address]  ASSESSMENT:  CLINICAL IMPRESSION: Patient is a 68 y.o. female  who was seen today for physical therapy evaluation and  treatment for anal cancer prior to starting cancer treatments as baseline. Pt presents with good functional moblity without restrictions in her mobility goals. She is walking daily for at least 30-45 mins and reports she is able to do all desired tasks. Pt does have mild hip and back tightness but not limiting. Pt denied pain or concerns with bowels as of now. Does have urinary incontinence with stressors and has for several years. Patient consented to internal pelvic floor assessment vaginally this date and found to have decreased strength, endurance, and coordination. Pt did demonstrate mild loss of urine with cough in hooklying and noted mild anterior wall laxity with cough as well. Pt educated on importance of energy conversation during treatments, walking program, HEP for hip and low back mobility with radiation treatment and vaginal dilator use for post radiation treatment. Pt would benefit from additional PT to return after radiation for reassessment.     OBJECTIVE IMPAIRMENTS: postural dysfunction.   ACTIVITY LIMITATIONS: continence  PARTICIPATION LIMITATIONS: community activity  PERSONAL FACTORS: 1 comorbidity: medical history   are also affecting patient's functional outcome.   REHAB POTENTIAL: Good  CLINICAL DECISION MAKING: Stable/uncomplicated  EVALUATION COMPLEXITY: Low   GOALS: Goals reviewed with patient? Yes  SHORT TERM GOALS: Target date: 03/03/23  Pt will be independent with HEP.   Baseline: Goal status: INITIAL  2.  Pt to be I with voiding mechanics and abdominal massage for bowel habits.  Baseline:  Goal status: INITIAL   LONG TERM GOALS: Target date: 08/03/23  Pt to be I with advanced HEP.  Baseline:  Goal status: INITIAL  2.  Pt to demonstrate at least 5/5 bil hip strength for improved pelvic stability and functional squats without leakage.  Baseline:  Goal status: INITIAL  3.  Pt to demonstrate improved coordination of pelvic floor and breathing  mechanics with 10# squat with appropriate synergistic patterns to decrease pain and leakage at least 75% of the time.    Baseline:  Goal status: INITIAL  4.  Pt will report her BMs are complete and at least 3x weekly due to improved bowel habits and evacuation techniques.  Baseline:  Goal status: INITIAL  5.  Pt to be I with knack for decreased leakage with coughing and stressors.  Baseline:  Goal status: INITIAL PLAN:  PT FREQUENCY:  1 more treatment post radiation completion, however if needed may return 1-2x weekly during cancer treatment    PT DURATION:  8 sessions  PLANNED INTERVENTIONS: 97110-Therapeutic exercises, 97530- Therapeutic activity, 97112- Neuromuscular re-education, 97535- Self Care, 62952- Manual therapy, Patient/Family education, Taping, Dry Needling, Joint mobilization, Spinal mobilization, Scar mobilization, and Biofeedback  PLAN FOR NEXT SESSION: reassess post cancer treatments   Otelia Sergeant, PT, DPT 01/17/254:24 PM

## 2023-02-04 ENCOUNTER — Other Ambulatory Visit: Payer: Self-pay

## 2023-02-04 ENCOUNTER — Ambulatory Visit (HOSPITAL_COMMUNITY)
Admission: RE | Admit: 2023-02-04 | Discharge: 2023-02-04 | Disposition: A | Discharge status: Home / Self Care | Payer: Medicare Other | Source: Ambulatory Visit | Attending: Nurse Practitioner

## 2023-02-04 DIAGNOSIS — C210 Malignant neoplasm of anus, unspecified: Secondary | ICD-10-CM | POA: Diagnosis present

## 2023-02-04 DIAGNOSIS — Z510 Encounter for antineoplastic radiation therapy: Secondary | ICD-10-CM | POA: Diagnosis not present

## 2023-02-04 MED ORDER — LIDOCAINE-EPINEPHRINE 1 %-1:100000 IJ SOLN
20.0000 mL | Freq: Once | INTRAMUSCULAR | Status: AC
  Administered 2023-02-04: 2 mL via INTRADERMAL

## 2023-02-04 MED ORDER — HEPARIN SOD (PORK) LOCK FLUSH 100 UNIT/ML IV SOLN
INTRAVENOUS | Status: AC
  Filled 2023-02-04: qty 5

## 2023-02-04 MED ORDER — LIDOCAINE-EPINEPHRINE 1 %-1:100000 IJ SOLN
INTRAMUSCULAR | Status: AC
  Filled 2023-02-04: qty 1

## 2023-02-04 MED ORDER — HEPARIN SOD (PORK) LOCK FLUSH 100 UNIT/ML IV SOLN
500.0000 [IU] | Freq: Once | INTRAVENOUS | Status: AC
  Administered 2023-02-04: 500 [IU] via INTRAVENOUS

## 2023-02-04 NOTE — Procedures (Signed)
Right DL basilic vein PICC placed. Length 39 cm. Tip SVC/RA junction. No immediate complications. Ok to use. Medication used- 1% lidocaine to skin/SQ tissue. EBL none.

## 2023-02-06 NOTE — Progress Notes (Unsigned)
Patient Care Team: Avanell Shackleton, NP-C as PCP - General (Family Medicine) Napoleon Form, MD as Consulting Physician (Gastroenterology) [facility] as Consulting Physician (Ophthalmology) Pollyann Samples, NP as Nurse Practitioner (Hematology and Oncology)   CHIEF COMPLAINT: Follow up anal cancer   Oncology History  Anal cancer (HCC)  01/25/2023 Initial Diagnosis   Anal cancer (HCC)   02/07/2023 -  Chemotherapy   Patient is on Treatment Plan : ANUS Mitomycin D1,28 + 5FU D1-4, 28-31 q32d     02/07/2023 Cancer Staging   Staging form: Anus, AJCC V9 - Clinical: Stage I (cT1, cN0, cM0) - Signed by Pollyann Samples, NP on 02/07/2023 Stage prefix: Initial diagnosis      CURRENT THERAPY: Definitive concurrent chemoRT with 5FU/mitomycin, starting 02/07/23  INTERVAL HISTORY  Ms. Brailsford returns for follow up as scheduled. Last seen by [place] 01/25/23. Work up revealed she has localized disease but not a candidate for endoscopic resection; she met with radiation, attended chemo class, had first PT session, and underwent PICC placement 1/17 which she tolerated well.  She is here to start definitive ccRT.  Doing well overall, no changes.  Bowel movements remain "timely" and normal.  Denies rectal pain, discharge, or bleeding.   ROS  All other systems reviewed and negative  Past Medical History:  Diagnosis Date   Chronic hepatitis C without hepatic coma (HCC) 09/22/2016   finished treatment for this   Gallstone    known with h/o cholecystitis pain, no recent recurrence   Hypertension    no meds taken     Past Surgical History:  Procedure Laterality Date   BREAST BIOPSY Left 2017   CHOLECYSTECTOMY N/A 08/04/2022   Procedure: LAPAROSCOPIC CHOLECYSTECTOMY;  Surgeon: Almond Lint, MD;  Location: [facility] OR;  Service: General;  Laterality: N/A;   COLONOSCOPY     MAXILLARY SINUS LIFT     MOUTH SURGERY     ORIF ANKLE FRACTURE Left 07/06/2018   Procedure: OPEN REDUCTION  INTERNAL FIXATION (ORIF) left ankle trimalleolar fracture;  Surgeon: Toni Arthurs, MD;  Location: [facility];  Service: Orthopedics;  Laterality: Left;    TONSILLECTOMY     age 68   UPPER GASTROINTESTINAL ENDOSCOPY       Outpatient Encounter Medications as of 02/07/2023  Medication Sig   atorvastatin (LIPITOR) 10 MG tablet Take 1 tablet (10 mg total) by mouth daily.   Cholecalciferol (VITAMIN D3) 50 MCG (2000 UT) TABS Take 2,000 Units by mouth in the morning and at bedtime.   ondansetron (ZOFRAN) 8 MG tablet Take 1 tablet (8 mg total) by mouth every 8 (eight) hours as needed for nausea or vomiting.   prochlorperazine (COMPAZINE) 10 MG tablet Take 1 tablet (10 mg total) by mouth every 6 (six) hours as needed for nausea or vomiting.   Facility-Administered Encounter Medications as of 02/07/2023  Medication   indocyanine green (IC-GREEN) injection 25 mg     Today's Vitals   02/07/23 0830 02/07/23 0831  BP: (!) 163/78   Pulse: 84   Resp: 20   Temp: 97.6 F (36.4 C)   TempSrc: Temporal   SpO2: 98%   Weight: 132 lb 8 oz (60.1 kg)   Height: 5\' 1"  (1.549 m)   PainSc:  0-No pain   Body mass index is 25.04 kg/m.   PHYSICAL EXAM GENERAL:alert, no distress and comfortable SKIN: no rash  EYES: sclera clear LUNGS: clear with normal breathing effort HEART: regular rate & rhythm,  no lower extremity edema ABDOMEN: abdomen soft, non-tender and normal bowel sounds NEURO: alert & oriented x 3 with fluent speech, no focal motor/sensory deficits PICC without erythema    CBC    Component Value Date/Time   WBC 9.4 02/07/2023 0741   WBC 10.1 07/28/2022 1333   RBC 4.08 02/07/2023 0741   HGB 12.3 02/07/2023 0741   HGB 13.5 09/26/2019 1102   HCT 37.3 02/07/2023 0741   HCT 40.6 09/26/2019 1102   PLT 254 02/07/2023 0741   PLT 311 09/26/2019 1102   MCV 91.4 02/07/2023 0741   MCV 90 09/26/2019 1102   MCH 30.1 02/07/2023 0741   MCHC 33.0 02/07/2023 0741   RDW 12.8  02/07/2023 0741   RDW 12.2 09/26/2019 1102   LYMPHSABS 1.7 02/07/2023 0741   LYMPHSABS 2.3 06/08/2017 1636   MONOABS 0.9 02/07/2023 0741   EOSABS 0.2 02/07/2023 0741   EOSABS 0.1 06/08/2017 1636   BASOSABS 0.1 02/07/2023 0741   BASOSABS 0.0 06/08/2017 1636     CMP     Component Value Date/Time   NA 137 02/07/2023 0741   NA 141 09/26/2019 1102   K 3.9 02/07/2023 0741   CL 104 02/07/2023 0741   CO2 28 02/07/2023 0741   GLUCOSE 116 (H) 02/07/2023 0741   BUN 12 02/07/2023 0741   BUN 9 09/26/2019 1102   CREATININE 0.52 02/07/2023 0741   CREATININE 0.45 (L) 09/22/2016 1350   CALCIUM 9.6 02/07/2023 0741   PROT 7.0 02/07/2023 0741   PROT 7.1 09/26/2019 1102   ALBUMIN 4.3 02/07/2023 0741   ALBUMIN 4.7 09/26/2019 1102   AST 16 02/07/2023 0741   ALT 17 02/07/2023 0741   ALT 37 (H) 09/22/2016 1350   ALKPHOS 50 02/07/2023 0741   BILITOT 0.5 02/07/2023 0741   GFRNONAA >60 02/07/2023 0741   GFRNONAA 107 09/22/2016 1350   GFRAA 113 09/26/2019 1102   GFRAA 124 09/22/2016 1350     ASSESSMENT & PLAN: 68 yo female    Anal cancer, squamous cell carcinoma, cT1N0M0, stage I, P16+ -Screening colonoscopy shows a small mass at the most distal rectum, path confirmed SCC -Exam shows a nodular mass just above the anal verge -HIV negative in 2018 -Pelvic MRI 01/04/2023 showed slight asymmetric nodular thickening along the left side of the upper anal region, without definite extension through the internal sphincter, no abnormal lymph nodes -PET scan/30/24 was negative for lymphadenopathy or distant metastasis  -Surgical consult with Dr. Maisie Fus 01/24/23, appears to be fixed to the underlying structures does not appear to be surgically resectable with negative margins and recommends proceeding with curative chemoradiation  -Consented to chemo; had first PT session. PICC placed 1/17 -Ms. Dascenzo is doing well, she remains asymptomatic, today's labs are unremarkable. -We again reviewed the curative  intent of definitive chemoradiation, potential risk/benefit and side effects of chemo, and symptom management.  She is ready to proceed -Cycle 1 day one 5-FU/mitomycin today as scheduled, no dose adjustments.  Begin radiation later today. 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  -We discussed this, will circle back after she completes chemoradiation    PLAN: -Labs reviewed -Reviewed the curative intent of definitive chemoradiation, potential risk/benefit/SE's of chemo, and symptom management.  -Proceed with Cycle 1 day 1 of 5-FU/mitomycin, no dose adjustments -Begin radiation later today -Pump/PICC d/c on 1/24 -Weekly lab and follow-up on therapy -Discussed genetics,  circle back after chemoradiation   Orders Placed This Encounter  Procedures   CBC with Differential (Cancer Center Only)    Standing Status:   Standing    Number of Occurrences:   10    Expiration Date:   02/07/2024   CMP (Cancer Center only)    Standing Status:   Standing    Number of Occurrences:   10    Expiration Date:   02/07/2024      All questions were answered. The patient knows to call the clinic with any problems, questions or concerns. No barriers to learning were detected. I spent 20 minutes counseling the patient face to face. The total time spent in the appointment was 30 minutes and more than 50% was on counseling, review of test results, and coordination of care.   Santiago Glad, NP-C 02/07/2023

## 2023-02-07 ENCOUNTER — Other Ambulatory Visit: Payer: Self-pay

## 2023-02-07 ENCOUNTER — Encounter: Payer: Self-pay | Admitting: Nurse Practitioner

## 2023-02-07 ENCOUNTER — Inpatient Hospital Stay: Payer: Medicare Other

## 2023-02-07 ENCOUNTER — Ambulatory Visit
Admission: RE | Admit: 2023-02-07 | Discharge: 2023-02-07 | Disposition: A | Discharge status: Home / Self Care | Payer: Medicare Other | Source: Ambulatory Visit | Attending: Radiation Oncology | Admitting: Radiation Oncology

## 2023-02-07 ENCOUNTER — Inpatient Hospital Stay (HOSPITAL_BASED_OUTPATIENT_CLINIC_OR_DEPARTMENT_OTHER): Payer: Medicare Other | Admitting: Nurse Practitioner

## 2023-02-07 VITALS — BP 163/78 | HR 84 | Temp 97.6°F | Resp 20 | Ht 61.0 in | Wt 132.5 lb

## 2023-02-07 DIAGNOSIS — C210 Malignant neoplasm of anus, unspecified: Secondary | ICD-10-CM

## 2023-02-07 DIAGNOSIS — Z95828 Presence of other vascular implants and grafts: Secondary | ICD-10-CM | POA: Insufficient documentation

## 2023-02-07 DIAGNOSIS — Z510 Encounter for antineoplastic radiation therapy: Secondary | ICD-10-CM | POA: Diagnosis not present

## 2023-02-07 LAB — CMP (CANCER CENTER ONLY)
ALT: 17 U/L (ref 0–44)
AST: 16 U/L (ref 15–41)
Albumin: 4.3 g/dL (ref 3.5–5.0)
Alkaline Phosphatase: 50 U/L (ref 38–126)
Anion gap: 5 (ref 5–15)
BUN: 12 mg/dL (ref 8–23)
CO2: 28 mmol/L (ref 22–32)
Calcium: 9.6 mg/dL (ref 8.9–10.3)
Chloride: 104 mmol/L (ref 98–111)
Creatinine: 0.52 mg/dL (ref 0.44–1.00)
GFR, Estimated: >60 mL/min (ref >60)
Glucose, Bld: 116 mg/dL — ABNORMAL HIGH (ref 70–99)
Potassium: 3.9 mmol/L (ref 3.5–5.1)
Sodium: 137 mmol/L (ref 135–145)
Total Bilirubin: 0.5 mg/dL (ref 0.0–1.2)
Total Protein: 7 g/dL (ref 6.5–8.1)

## 2023-02-07 LAB — CBC WITH DIFFERENTIAL (CANCER CENTER ONLY)
Abs Immature Granulocytes: 0.03 10*3/uL (ref 0.00–0.07)
Basophils Absolute: 0.1 10*3/uL (ref 0.0–0.1)
Basophils Relative: 1 %
Eosinophils Absolute: 0.2 10*3/uL (ref 0.0–0.5)
Eosinophils Relative: 2 %
HCT: 37.3 % (ref 36.0–46.0)
Hemoglobin: 12.3 g/dL (ref 12.0–15.0)
Immature Granulocytes: 0 %
Lymphocytes Relative: 18 %
Lymphs Abs: 1.7 10*3/uL (ref 0.7–4.0)
MCH: 30.1 pg (ref 26.0–34.0)
MCHC: 33 g/dL (ref 30.0–36.0)
MCV: 91.4 fL (ref 80.0–100.0)
Monocytes Absolute: 0.9 10*3/uL (ref 0.1–1.0)
Monocytes Relative: 10 %
Neutro Abs: 6.4 10*3/uL (ref 1.7–7.7)
Neutrophils Relative %: 69 %
Platelet Count: 254 10*3/uL (ref 150–400)
RBC: 4.08 MIL/uL (ref 3.87–5.11)
RDW: 12.8 % (ref 11.5–15.5)
WBC Count: 9.4 10*3/uL (ref 4.0–10.5)
nRBC: 0 % (ref 0.0–0.2)

## 2023-02-07 LAB — RAD ONC ARIA SESSION SUMMARY
Course Elapsed Days: 0
Plan Fractions Treated to Date: 1
Plan Prescribed Dose Per Fraction: 1.8 Gy
Plan Total Fractions Prescribed: 28
Plan Total Prescribed Dose: 50.4 Gy
Reference Point Dosage Given to Date: 1.8 Gy
Reference Point Session Dosage Given: 1.8 Gy
Session Number: 1

## 2023-02-07 MED ORDER — HEPARIN SOD (PORK) LOCK FLUSH 100 UNIT/ML IV SOLN
500.0000 [IU] | Freq: Once | INTRAVENOUS | Status: DC | PRN
Start: 2023-02-07 — End: 2023-02-07

## 2023-02-07 MED ORDER — SODIUM CHLORIDE 0.9 % IV SOLN: INTRAVENOUS | Status: DC

## 2023-02-07 MED ORDER — SODIUM CHLORIDE 0.9 % IV SOLN
1000.0000 mg/m2/d | INTRAVENOUS | Status: DC
  Administered 2023-02-07: 7000 mg via INTRAVENOUS
  Filled 2023-02-07: qty 140

## 2023-02-07 MED ORDER — SODIUM CHLORIDE 0.9% FLUSH
10.0000 mL | INTRAVENOUS | Status: DC | PRN
Start: 2023-02-07 — End: 2023-02-07

## 2023-02-07 MED ORDER — PROCHLORPERAZINE MALEATE 10 MG PO TABS
10.0000 mg | ORAL_TABLET | Freq: Once | ORAL | Status: AC
  Administered 2023-02-07: 10 mg via ORAL
  Filled 2023-02-07: qty 1

## 2023-02-07 MED ORDER — HEPARIN SOD (PORK) LOCK FLUSH 100 UNIT/ML IV SOLN
500.0000 [IU] | Freq: Once | INTRAVENOUS | Status: AC
  Administered 2023-02-07: 500 [IU]

## 2023-02-07 MED ORDER — SODIUM CHLORIDE 0.9% FLUSH
10.0000 mL | Freq: Once | INTRAVENOUS | Status: AC
  Administered 2023-02-07: 10 mL

## 2023-02-07 MED ORDER — MITOMYCIN CHEMO IV INJECTION 20 MG
10.0000 mg/m2 | Freq: Once | INTRAVENOUS | Status: AC
  Administered 2023-02-07: 16 mg via INTRAVENOUS
  Filled 2023-02-07: qty 32

## 2023-02-07 NOTE — Patient Instructions (Addendum)
[facility] - A DEPT OF [facility]  Discharge Instructions: Thank you for choosing [facility] to provide your oncology and hematology care.   If you have a lab appointment with the Cancer Center, please go directly to the Cancer Center and check in at the registration area.   Wear comfortable clothing and clothing appropriate for easy access to any Portacath or PICC line.   We strive to give you quality time with your provider. You may need to reschedule your appointment if you arrive late (15 or more minutes).  Arriving late affects you and other patients whose appointments are after yours.  Also, if you miss three or more appointments without notifying the office, you may be dismissed from the clinic at the provider's discretion.      For prescription refill requests, have your pharmacy contact our office and allow 72 hours for refills to be completed.    Today you received the following chemotherapy and/or immunotherapy agents mutamycin and 5FU      To help prevent nausea and vomiting after your treatment, we encourage you to take your nausea medication as directed.  BELOW ARE SYMPTOMS THAT SHOULD BE REPORTED IMMEDIATELY: *FEVER GREATER THAN 100.4 F (38 C) OR HIGHER *CHILLS OR SWEATING *NAUSEA AND VOMITING THAT IS NOT CONTROLLED WITH YOUR NAUSEA MEDICATION *UNUSUAL SHORTNESS OF BREATH *UNUSUAL BRUISING OR BLEEDING *URINARY PROBLEMS (pain or burning when urinating, or frequent urination) *BOWEL PROBLEMS (unusual diarrhea, constipation, pain near the anus) TENDERNESS IN MOUTH AND THROAT WITH OR WITHOUT PRESENCE OF ULCERS (sore throat, sores in mouth, or a toothache) UNUSUAL RASH, SWELLING OR PAIN  UNUSUAL VAGINAL DISCHARGE OR ITCHING   Items with * indicate a potential emergency and should be followed up as soon as possible or go to the Emergency Department if any problems should occur.  Please show the CHEMOTHERAPY ALERT CARD or  IMMUNOTHERAPY ALERT CARD at check-in to the Emergency Department and triage nurse.  Should you have questions after your visit or need to cancel or reschedule your appointment, please contact [facility] - A DEPT OF [facility]  Dept: [phone]  and follow the prompts.  Office hours are 8:00 a.m. to 4:30 p.m. Monday - Friday. Please note that voicemails left after 4:00 p.m. may not be returned until the following business day.  We are closed weekends and major holidays. You have access to a nurse at all times for urgent questions. Please call the main number to the clinic Dept: [phone] and follow the prompts.   For any non-urgent questions, you may also contact your provider using MyChart. We now offer e-Visits for anyone 72 and older to request care online for non-urgent symptoms. For details visit [email or web address].   Also download the MyChart app! Go to the app store, search "MyChart", open the app, select [facility] [place], and log in with your MyChart username and password.  Mitomycin Injection What is this medication? MITOMYCIN (mye toe MYE sin) treats stomach cancer and pancreatic cancer. It works by slowing down the growth of cancer cells. This medicine may be used for other purposes; ask your health care provider or pharmacist if you have questions. COMMON BRAND NAME(S): Mutamycin What should I tell my care team before I take this medication? They need to know if you have any of these conditions: Bleeding disorders Infection, such as chickenpox, cold sores, herpes Low blood counts, such as low white cells,  platelets, red blood cells Kidney disease An unusual or allergic reaction to mitomycin, other medications, foods, dyes, or preservatives Pregnant or trying to get pregnant Breastfeeding How should I use this medication? This medication is injected into a vein. It is given by your care team in a hospital or clinic setting. Talk to your care  team about the use of this medication in children. Special care may be needed. Overdosage: If you think you have taken too much of this medicine contact a poison control center or emergency room at once. NOTE: This medicine is only for you. Do not share this medicine with others. What if I miss a dose? Keep appointments for follow-up doses. It is important not to miss your dose. Call your care team if you are unable to keep an appointment. What may interact with this medication? Interactions are not expected. This list may not describe all possible interactions. Give your health care provider a list of all the medicines, herbs, non-prescription drugs, or dietary supplements you use. Also tell them if you smoke, drink alcohol, or use illegal drugs. Some items may interact with your medicine. What should I watch for while using this medication? Your condition will be monitored carefully while you are receiving this medication. You may need blood work while taking this medication. This medication may make you feel generally unwell. This is not uncommon as chemotherapy can affect healthy cells as well as cancer cells. Report any side effects. Continue your course of treatment even though you feel ill unless your care team tells you to stop. This medication may increase your risk of getting an infection. Call your care team for advice if you get a fever, chills, sore throat, or other symptoms of a cold or flu. Do not treat yourself. Try to avoid being around people who are sick. Avoid taking medications that contain aspirin, acetaminophen, ibuprofen, naproxen, or ketoprofen unless instructed by your care team. These medications may hide a fever. This medication may increase your risk to bruise or bleed. Call your care team if you notice any unusual bleeding. Be careful brushing or flossing your teeth or using a toothpick because you may get an infection or bleed more easily. If you have any dental work  done, tell your dentist you are receiving this medication. Talk to your care team if you may be pregnant. Serious birth defects can occur if you take this medication during pregnancy. Contraception is recommended while taking this medication. Your care team can help you find the option that works for you. Do not breastfeed while taking this medication. What side effects may I notice from receiving this medication? Side effects that you should report to your care team as soon as possible: Allergic reactions--skin rash, itching, hives, swelling of the face, lips, tongue, or throat Dry cough, shortness of breath or trouble breathing Infection--fever, chills, cough, sore throat, wounds that don't heal, pain or trouble when passing urine, general feeling of discomfort or being unwell Kidney injury--decrease in the amount of urine, swelling of the ankles, hands, or feet Low red blood cell level--unusual weakness or fatigue, dizziness, headache, trouble breathing Stomach pain, bloody diarrhea, pale skin, unusual weakness or fatigue, decrease in the amount of urine, which may be signs of hemolytic uremic syndrome Unusual bruising or bleeding Side effects that usually do not require medical attention (report these to your care team if they continue or are bothersome): Diarrhea Hair loss Loss of appetite with weight loss Nausea Pain, redness, or swelling with  sores inside the mouth or throat This list may not describe all possible side effects. Call your doctor for medical advice about side effects. You may report side effects to FDA at 1-800-FDA-1088. Where should I keep my medication? This medication is given in a hospital or clinic. It will not be stored at home. NOTE: This sheet is a summary. It may not cover all possible information. If you have questions about this medicine, talk to your doctor, pharmacist, or health care provider.  2024 Elsevier/Gold Standard (2021-05-28 00:00:00)  Fluorouracil  Injection What is this medication? FLUOROURACIL (flure oh YOOR a sil) treats some types of cancer. It works by slowing down the growth of cancer cells. This medicine may be used for other purposes; ask your health care provider or pharmacist if you have questions. COMMON BRAND NAME(S): Adrucil What should I tell my care team before I take this medication? They need to know if you have any of these conditions: Blood disorders Dihydropyrimidine dehydrogenase (DPD) deficiency Infection, such as chickenpox, cold sores, herpes Kidney disease Liver disease Poor nutrition Recent or ongoing radiation therapy An unusual or allergic reaction to fluorouracil, other medications, foods, dyes, or preservatives If you or your partner are pregnant or trying to get pregnant Breast-feeding How should I use this medication? This medication is injected into a vein. It is administered by your care team in a hospital or clinic setting. Talk to your care team about the use of this medication in children. Special care may be needed. Overdosage: If you think you have taken too much of this medicine contact a poison control center or emergency room at once. NOTE: This medicine is only for you. Do not share this medicine with others. What if I miss a dose? Keep appointments for follow-up doses. It is important not to miss your dose. Call your care team if you are unable to keep an appointment. What may interact with this medication? Do not take this medication with any of the following: Live virus vaccines This medication may also interact with the following: Medications that treat or prevent blood clots, such as warfarin, enoxaparin, dalteparin This list may not describe all possible interactions. Give your health care provider a list of all the medicines, herbs, non-prescription drugs, or dietary supplements you use. Also tell them if you smoke, drink alcohol, or use illegal drugs. Some items may interact with  your medicine. What should I watch for while using this medication? Your condition will be monitored carefully while you are receiving this medication. This medication may make you feel generally unwell. This is not uncommon as chemotherapy can affect healthy cells as well as cancer cells. Report any side effects. Continue your course of treatment even though you feel ill unless your care team tells you to stop. In some cases, you may be given additional medications to help with side effects. Follow all directions for their use. This medication may increase your risk of getting an infection. Call your care team for advice if you get a fever, chills, sore throat, or other symptoms of a cold or flu. Do not treat yourself. Try to avoid being around people who are sick. This medication may increase your risk to bruise or bleed. Call your care team if you notice any unusual bleeding. Be careful brushing or flossing your teeth or using a toothpick because you may get an infection or bleed more easily. If you have any dental work done, tell your dentist you are receiving this medication. Avoid  taking medications that contain aspirin, acetaminophen, ibuprofen, naproxen, or ketoprofen unless instructed by your care team. These medications may hide a fever. Do not treat diarrhea with over the counter products. Contact your care team if you have diarrhea that lasts more than 2 days or if it is severe and watery. This medication can make you more sensitive to the sun. Keep out of the sun. If you cannot avoid being in the sun, wear protective clothing and sunscreen. Do not use sun lamps, tanning beds, or tanning booths. Talk to your care team if you or your partner wish to become pregnant or think you might be pregnant. This medication can cause serious birth defects if taken during pregnancy and for 3 months after the last dose. A reliable form of contraception is recommended while taking this medication and for 3  months after the last dose. Talk to your care team about effective forms of contraception. Do not father a child while taking this medication and for 3 months after the last dose. Use a condom while having sex during this time period. Do not breastfeed while taking this medication. This medication may cause infertility. Talk to your care team if you are concerned about your fertility. What side effects may I notice from receiving this medication? Side effects that you should report to your care team as soon as possible: Allergic reactions--skin rash, itching, hives, swelling of the face, lips, tongue, or throat Heart attack--pain or tightness in the chest, shoulders, arms, or jaw, nausea, shortness of breath, cold or clammy skin, feeling faint or lightheaded Heart failure--shortness of breath, swelling of the ankles, feet, or hands, sudden weight gain, unusual weakness or fatigue Heart rhythm changes--fast or irregular heartbeat, dizziness, feeling faint or lightheaded, chest pain, trouble breathing High ammonia level--unusual weakness or fatigue, confusion, loss of appetite, nausea, vomiting, seizures Infection--fever, chills, cough, sore throat, wounds that don't heal, pain or trouble when passing urine, general feeling of discomfort or being unwell Low red blood cell level--unusual weakness or fatigue, dizziness, headache, trouble breathing Pain, tingling, or numbness in the hands or feet, muscle weakness, change in vision, confusion or trouble speaking, loss of balance or coordination, trouble walking, seizures Redness, swelling, and blistering of the skin over hands and feet Severe or prolonged diarrhea Unusual bruising or bleeding Side effects that usually do not require medical attention (report to your care team if they continue or are bothersome): Dry skin Headache Increased tears Nausea Pain, redness, or swelling with sores inside the mouth or throat Sensitivity to  light Vomiting This list may not describe all possible side effects. Call your doctor for medical advice about side effects. You may report side effects to FDA at 1-800-FDA-1088. Where should I keep my medication? This medication is given in a hospital or clinic. It will not be stored at home. NOTE: This sheet is a summary. It may not cover all possible information. If you have questions about this medicine, talk to your doctor, pharmacist, or health care provider.  2024 Elsevier/Gold Standard (2021-05-12 00:00:00)

## 2023-02-08 ENCOUNTER — Ambulatory Visit
Admission: RE | Admit: 2023-02-08 | Discharge: 2023-02-08 | Disposition: A | Discharge status: Home / Self Care | Payer: Medicare Other | Source: Ambulatory Visit | Attending: Radiation Oncology | Admitting: Radiation Oncology

## 2023-02-08 ENCOUNTER — Other Ambulatory Visit: Payer: Self-pay

## 2023-02-08 DIAGNOSIS — Z510 Encounter for antineoplastic radiation therapy: Secondary | ICD-10-CM | POA: Diagnosis not present

## 2023-02-08 LAB — RAD ONC ARIA SESSION SUMMARY
Course Elapsed Days: 1
Plan Fractions Treated to Date: 2
Plan Prescribed Dose Per Fraction: 1.8 Gy
Plan Total Fractions Prescribed: 28
Plan Total Prescribed Dose: 50.4 Gy
Reference Point Dosage Given to Date: 3.6 Gy
Reference Point Session Dosage Given: 1.8 Gy
Session Number: 2

## 2023-02-09 ENCOUNTER — Other Ambulatory Visit: Payer: Self-pay

## 2023-02-09 ENCOUNTER — Ambulatory Visit
Admission: RE | Admit: 2023-02-09 | Discharge: 2023-02-09 | Disposition: A | Discharge status: Home / Self Care | Payer: Medicare Other | Source: Ambulatory Visit | Attending: Radiation Oncology

## 2023-02-09 DIAGNOSIS — Z510 Encounter for antineoplastic radiation therapy: Secondary | ICD-10-CM | POA: Diagnosis not present

## 2023-02-09 LAB — RAD ONC ARIA SESSION SUMMARY
Course Elapsed Days: 2
Plan Fractions Treated to Date: 3
Plan Prescribed Dose Per Fraction: 1.8 Gy
Plan Total Fractions Prescribed: 28
Plan Total Prescribed Dose: 50.4 Gy
Reference Point Dosage Given to Date: 5.4 Gy
Reference Point Session Dosage Given: 1.8 Gy
Session Number: 3

## 2023-02-10 ENCOUNTER — Other Ambulatory Visit: Payer: Self-pay

## 2023-02-10 ENCOUNTER — Ambulatory Visit
Admission: RE | Admit: 2023-02-10 | Discharge: 2023-02-10 | Disposition: A | Discharge status: Home / Self Care | Payer: Medicare Other | Source: Ambulatory Visit | Attending: Radiation Oncology | Admitting: Radiation Oncology

## 2023-02-10 DIAGNOSIS — Z510 Encounter for antineoplastic radiation therapy: Secondary | ICD-10-CM | POA: Diagnosis not present

## 2023-02-10 LAB — RAD ONC ARIA SESSION SUMMARY
Course Elapsed Days: 3
Plan Fractions Treated to Date: 4
Plan Prescribed Dose Per Fraction: 1.8 Gy
Plan Total Fractions Prescribed: 28
Plan Total Prescribed Dose: 50.4 Gy
Reference Point Dosage Given to Date: 7.2 Gy
Reference Point Session Dosage Given: 1.8 Gy
Session Number: 4

## 2023-02-11 ENCOUNTER — Inpatient Hospital Stay: Payer: Medicare Other

## 2023-02-11 ENCOUNTER — Other Ambulatory Visit: Payer: Self-pay

## 2023-02-11 ENCOUNTER — Ambulatory Visit
Admission: RE | Admit: 2023-02-11 | Discharge: 2023-02-11 | Disposition: A | Discharge status: Home / Self Care | Payer: Medicare Other | Source: Ambulatory Visit | Attending: Radiation Oncology

## 2023-02-11 ENCOUNTER — Ambulatory Visit
Admission: RE | Admit: 2023-02-11 | Discharge: 2023-02-11 | Disposition: A | Discharge status: Home / Self Care | Payer: Medicare Other | Source: Ambulatory Visit | Attending: Radiation Oncology | Admitting: Radiation Oncology

## 2023-02-11 VITALS — BP 160/71 | HR 81 | Temp 98.5°F | Resp 16

## 2023-02-11 DIAGNOSIS — Z510 Encounter for antineoplastic radiation therapy: Secondary | ICD-10-CM | POA: Diagnosis not present

## 2023-02-11 DIAGNOSIS — C210 Malignant neoplasm of anus, unspecified: Secondary | ICD-10-CM

## 2023-02-11 LAB — RAD ONC ARIA SESSION SUMMARY
Course Elapsed Days: 4
Plan Fractions Treated to Date: 5
Plan Prescribed Dose Per Fraction: 1.8 Gy
Plan Total Fractions Prescribed: 28
Plan Total Prescribed Dose: 50.4 Gy
Reference Point Dosage Given to Date: 9 Gy
Reference Point Session Dosage Given: 1.8 Gy
Session Number: 5

## 2023-02-11 MED ORDER — SODIUM CHLORIDE 0.9% FLUSH
10.0000 mL | INTRAVENOUS | Status: DC | PRN
  Administered 2023-02-11: 10 mL

## 2023-02-11 NOTE — Progress Notes (Signed)
PICC Removal Note: S: Brittany Summers presents for PICC line removal - PICC line removed by Eliezer Lofts, RN.  O: PICC line removed from R antecubital after sterile site prepped per protocol. PICC catheter tip visualized and intact. Pressure dressing applied. A: No redness, ecchymosis, edema, swelling, or drainage noted at site. P: Instructions provided on post PICC discharge care, including followup notification instructions.

## 2023-02-11 NOTE — Patient Instructions (Signed)
PICC Removal, Adult, Care After The following information offers guidance on how to care for yourself after your procedure. Your health care provider may also give you more specific instructions. If you have problems or questions, contact your health care provider. What can I expect after the procedure? After the procedure, it is common to have: Tenderness or soreness. Redness, swelling, or a scab at the place where your PICC was removed (exit site). Follow these instructions at home: For the first 24 hours after the procedure: Keep the bandage (dressing) on your exit site clean and dry. Do not remove your dressing until your health care provider tells you to do so. Do not lift anything heavy or do activities that require great effort until your health care provider says it is okay. You should avoid: Lifting weights. Doing yard work. Doing any physical activity with repetitive arm movement. Watch closely for any signs of an air bubble in the vein (air embolism). This is a rare but serious complication. Signs of an air embolism include trouble breathing, wheezing, chest pain, or a fast pulse. If you have signs of an air embolism, call 911 right away and lie down on your left side to keep the air from moving into your lungs. After 24 hours have passed:  Remove your dressing as told by your health care provider. Wash your hands with soap and water for at least 20 seconds before and after you change your dressing. If soap and water are not available, use hand sanitizer. Return to your normal activities as told by your health care provider. A small scab may develop over the exit site. Do not pick at the scab. When bathing or showering, gently wash the exit site with soap and water. Pat it dry. Watch for signs of infection, such as: A fever or chills. Swollen glands under your arm. More redness, swelling, or soreness around your arm. Blood, fluid, or pus coming from your exit site. Warmth or a  bad smell coming from your exit site. A red streak spreading away from your exit site. General instructions Take over-the-counter and prescription medicines only as told by your health care provider. Do not take any new medicines without checking with your health care provider first. If you were given an antibiotic ointment, apply it as told by your health care provider. Keep all follow-up visits. This is important. Contact a health care provider if: You have a fever or chills. You have swelling at your exit site or swollen glands under your arm. You have signs of infection at your exit site. You have soreness, redness, or swelling in your arm that gets worse. Get help right away if: You have numbness or tingling in your fingers, hand, or arm. Your arm looks blue and feels cold. You have signs of an air embolism, such as trouble breathing, wheezing, chest pain, or a fast pulse. These symptoms may be an emergency. Get medical help right away. Call 911. Do not wait to see if the symptoms will go away. Do not drive yourself to the hospital. Summary After a PICC is removed, it is common to have tenderness or soreness, redness, swelling, or a scab at the exit site. Keep the bandage (dressing) over the exit site clean and dry. Do not remove the dressing until your health care provider tells you to do so. Do not lift anything heavy or do activities that require great effort until your health care provider says it is okay. Watch closely for any signs  of an air bubble (air embolism). If you have signs of an air embolism, call 911 right away and lie down on your left side. This information is not intended to replace advice given to you by your health care provider. Make sure you discuss any questions you have with your health care provider. Document Revised: 07/23/2020 Document Reviewed: 07/23/2020 Elsevier Patient Education  2024 [employer].

## 2023-02-14 ENCOUNTER — Other Ambulatory Visit: Payer: Self-pay

## 2023-02-14 ENCOUNTER — Ambulatory Visit
Admission: RE | Admit: 2023-02-14 | Discharge: 2023-02-14 | Disposition: A | Discharge status: Home / Self Care | Payer: Medicare Other | Source: Ambulatory Visit | Attending: Radiation Oncology | Admitting: Radiation Oncology

## 2023-02-14 ENCOUNTER — Inpatient Hospital Stay: Payer: Medicare Other

## 2023-02-14 ENCOUNTER — Encounter: Payer: Self-pay | Admitting: Nurse Practitioner

## 2023-02-14 DIAGNOSIS — C210 Malignant neoplasm of anus, unspecified: Secondary | ICD-10-CM

## 2023-02-14 DIAGNOSIS — Z510 Encounter for antineoplastic radiation therapy: Secondary | ICD-10-CM | POA: Diagnosis not present

## 2023-02-14 LAB — CMP (CANCER CENTER ONLY)
ALT: 18 U/L (ref 0–44)
AST: 17 U/L (ref 15–41)
Albumin: 4.4 g/dL (ref 3.5–5.0)
Alkaline Phosphatase: 43 U/L (ref 38–126)
Anion gap: 5 (ref 5–15)
BUN: 15 mg/dL (ref 8–23)
CO2: 28 mmol/L (ref 22–32)
Calcium: 9.7 mg/dL (ref 8.9–10.3)
Chloride: 102 mmol/L (ref 98–111)
Creatinine: 0.49 mg/dL (ref 0.44–1.00)
GFR, Estimated: >60 mL/min (ref >60)
Glucose, Bld: 98 mg/dL (ref 70–99)
Potassium: 4.5 mmol/L (ref 3.5–5.1)
Sodium: 135 mmol/L (ref 135–145)
Total Bilirubin: 0.7 mg/dL (ref 0.0–1.2)
Total Protein: 7.2 g/dL (ref 6.5–8.1)

## 2023-02-14 LAB — RAD ONC ARIA SESSION SUMMARY
Course Elapsed Days: 7
Plan Fractions Treated to Date: 6
Plan Prescribed Dose Per Fraction: 1.8 Gy
Plan Total Fractions Prescribed: 28
Plan Total Prescribed Dose: 50.4 Gy
Reference Point Dosage Given to Date: 10.8 Gy
Reference Point Session Dosage Given: 1.8 Gy
Session Number: 6

## 2023-02-14 LAB — CBC WITH DIFFERENTIAL (CANCER CENTER ONLY)
Abs Immature Granulocytes: 0.07 10*3/uL (ref 0.00–0.07)
Basophils Absolute: 0 10*3/uL (ref 0.0–0.1)
Basophils Relative: 1 %
Eosinophils Absolute: 0.2 10*3/uL (ref 0.0–0.5)
Eosinophils Relative: 3 %
HCT: 37.8 % (ref 36.0–46.0)
Hemoglobin: 12.3 g/dL (ref 12.0–15.0)
Immature Granulocytes: 1 %
Lymphocytes Relative: 12 %
Lymphs Abs: 0.7 10*3/uL (ref 0.7–4.0)
MCH: 30 pg (ref 26.0–34.0)
MCHC: 32.5 g/dL (ref 30.0–36.0)
MCV: 92.2 fL (ref 80.0–100.0)
Monocytes Absolute: 0.2 10*3/uL (ref 0.1–1.0)
Monocytes Relative: 3 %
Neutro Abs: 4.4 10*3/uL (ref 1.7–7.7)
Neutrophils Relative %: 80 %
Platelet Count: 193 10*3/uL (ref 150–400)
RBC: 4.1 MIL/uL (ref 3.87–5.11)
RDW: 12.7 % (ref 11.5–15.5)
WBC Count: 5.5 10*3/uL (ref 4.0–10.5)
nRBC: 0 % (ref 0.0–0.2)

## 2023-02-15 ENCOUNTER — Ambulatory Visit
Admission: RE | Admit: 2023-02-15 | Discharge: 2023-02-15 | Discharge status: Home / Self Care | Payer: Medicare Other | Source: Ambulatory Visit | Attending: Radiation Oncology

## 2023-02-15 ENCOUNTER — Other Ambulatory Visit: Payer: Self-pay

## 2023-02-15 DIAGNOSIS — Z510 Encounter for antineoplastic radiation therapy: Secondary | ICD-10-CM | POA: Diagnosis not present

## 2023-02-15 LAB — RAD ONC ARIA SESSION SUMMARY
Course Elapsed Days: 8
Plan Fractions Treated to Date: 7
Plan Prescribed Dose Per Fraction: 1.8 Gy
Plan Total Fractions Prescribed: 28
Plan Total Prescribed Dose: 50.4 Gy
Reference Point Dosage Given to Date: 12.6 Gy
Reference Point Session Dosage Given: 1.8 Gy
Session Number: 7

## 2023-02-16 ENCOUNTER — Other Ambulatory Visit: Payer: Self-pay

## 2023-02-16 ENCOUNTER — Ambulatory Visit
Admission: RE | Admit: 2023-02-16 | Discharge: 2023-02-16 | Disposition: A | Discharge status: Home / Self Care | Payer: Medicare Other | Source: Ambulatory Visit | Attending: Radiation Oncology | Admitting: Radiation Oncology

## 2023-02-16 DIAGNOSIS — Z510 Encounter for antineoplastic radiation therapy: Secondary | ICD-10-CM | POA: Diagnosis not present

## 2023-02-16 LAB — RAD ONC ARIA SESSION SUMMARY
Course Elapsed Days: 9
Plan Fractions Treated to Date: 8
Plan Prescribed Dose Per Fraction: 1.8 Gy
Plan Total Fractions Prescribed: 28
Plan Total Prescribed Dose: 50.4 Gy
Reference Point Dosage Given to Date: 14.4 Gy
Reference Point Session Dosage Given: 1.8 Gy
Session Number: 8

## 2023-02-17 ENCOUNTER — Ambulatory Visit
Admission: RE | Admit: 2023-02-17 | Discharge: 2023-02-17 | Disposition: A | Discharge status: Home / Self Care | Payer: Medicare Other | Source: Ambulatory Visit | Attending: Radiation Oncology | Admitting: Radiation Oncology

## 2023-02-17 ENCOUNTER — Other Ambulatory Visit: Payer: Self-pay

## 2023-02-17 DIAGNOSIS — Z510 Encounter for antineoplastic radiation therapy: Secondary | ICD-10-CM | POA: Diagnosis not present

## 2023-02-17 LAB — RAD ONC ARIA SESSION SUMMARY
Course Elapsed Days: 10
Plan Fractions Treated to Date: 9
Plan Prescribed Dose Per Fraction: 1.8 Gy
Plan Total Fractions Prescribed: 28
Plan Total Prescribed Dose: 50.4 Gy
Reference Point Dosage Given to Date: 16.2 Gy
Reference Point Session Dosage Given: 1.8 Gy
Session Number: 9

## 2023-02-18 ENCOUNTER — Encounter: Payer: Self-pay | Admitting: Interventional Radiology

## 2023-02-18 ENCOUNTER — Other Ambulatory Visit: Payer: Self-pay

## 2023-02-18 ENCOUNTER — Ambulatory Visit
Admission: RE | Admit: 2023-02-18 | Discharge: 2023-02-18 | Disposition: A | Discharge status: Home / Self Care | Payer: Medicare Other | Source: Ambulatory Visit | Attending: Radiation Oncology

## 2023-02-18 ENCOUNTER — Ambulatory Visit
Admission: RE | Admit: 2023-02-18 | Discharge: 2023-02-18 | Disposition: A | Discharge status: Home / Self Care | Payer: Medicare Other | Source: Ambulatory Visit | Attending: Radiation Oncology | Admitting: Radiation Oncology

## 2023-02-18 DIAGNOSIS — Z510 Encounter for antineoplastic radiation therapy: Secondary | ICD-10-CM | POA: Diagnosis not present

## 2023-02-18 LAB — RAD ONC ARIA SESSION SUMMARY
Course Elapsed Days: 11
Plan Fractions Treated to Date: 10
Plan Prescribed Dose Per Fraction: 1.8 Gy
Plan Total Fractions Prescribed: 28
Plan Total Prescribed Dose: 50.4 Gy
Reference Point Dosage Given to Date: 18 Gy
Reference Point Session Dosage Given: 1.8 Gy
Session Number: 10

## 2023-02-21 ENCOUNTER — Ambulatory Visit
Admission: RE | Admit: 2023-02-21 | Discharge: 2023-02-21 | Disposition: A | Discharge status: Home / Self Care | Payer: Medicare Other | Source: Ambulatory Visit | Attending: Radiation Oncology | Admitting: Radiation Oncology

## 2023-02-21 ENCOUNTER — Other Ambulatory Visit: Payer: Self-pay

## 2023-02-21 DIAGNOSIS — Z9221 Personal history of antineoplastic chemotherapy: Secondary | ICD-10-CM | POA: Insufficient documentation

## 2023-02-21 DIAGNOSIS — C210 Malignant neoplasm of anus, unspecified: Secondary | ICD-10-CM | POA: Diagnosis not present

## 2023-02-21 DIAGNOSIS — R7989 Other specified abnormal findings of blood chemistry: Secondary | ICD-10-CM | POA: Diagnosis not present

## 2023-02-21 DIAGNOSIS — R5383 Other fatigue: Secondary | ICD-10-CM | POA: Insufficient documentation

## 2023-02-21 DIAGNOSIS — R300 Dysuria: Secondary | ICD-10-CM | POA: Diagnosis not present

## 2023-02-21 DIAGNOSIS — K1230 Oral mucositis (ulcerative), unspecified: Secondary | ICD-10-CM | POA: Insufficient documentation

## 2023-02-21 DIAGNOSIS — Z5111 Encounter for antineoplastic chemotherapy: Secondary | ICD-10-CM | POA: Insufficient documentation

## 2023-02-21 DIAGNOSIS — Z923 Personal history of irradiation: Secondary | ICD-10-CM | POA: Insufficient documentation

## 2023-02-21 DIAGNOSIS — I10 Essential (primary) hypertension: Secondary | ICD-10-CM | POA: Diagnosis not present

## 2023-02-21 DIAGNOSIS — Z510 Encounter for antineoplastic radiation therapy: Secondary | ICD-10-CM | POA: Insufficient documentation

## 2023-02-21 DIAGNOSIS — D649 Anemia, unspecified: Secondary | ICD-10-CM | POA: Insufficient documentation

## 2023-02-21 DIAGNOSIS — B182 Chronic viral hepatitis C: Secondary | ICD-10-CM | POA: Diagnosis not present

## 2023-02-21 DIAGNOSIS — D72819 Decreased white blood cell count, unspecified: Secondary | ICD-10-CM | POA: Diagnosis not present

## 2023-02-21 DIAGNOSIS — Z79899 Other long term (current) drug therapy: Secondary | ICD-10-CM | POA: Insufficient documentation

## 2023-02-21 LAB — RAD ONC ARIA SESSION SUMMARY
Course Elapsed Days: 14
Plan Fractions Treated to Date: 11
Plan Prescribed Dose Per Fraction: 1.8 Gy
Plan Total Fractions Prescribed: 28
Plan Total Prescribed Dose: 50.4 Gy
Reference Point Dosage Given to Date: 19.8 Gy
Reference Point Session Dosage Given: 1.8 Gy
Session Number: 11

## 2023-02-22 ENCOUNTER — Encounter: Payer: Self-pay | Admitting: Nurse Practitioner

## 2023-02-22 ENCOUNTER — Ambulatory Visit
Admission: RE | Admit: 2023-02-22 | Discharge: 2023-02-22 | Disposition: A | Discharge status: Home / Self Care | Payer: Medicare Other | Source: Ambulatory Visit | Attending: Radiation Oncology

## 2023-02-22 ENCOUNTER — Inpatient Hospital Stay: Payer: Medicare Other

## 2023-02-22 ENCOUNTER — Other Ambulatory Visit: Payer: Self-pay

## 2023-02-22 ENCOUNTER — Other Ambulatory Visit (HOSPITAL_COMMUNITY): Payer: Self-pay

## 2023-02-22 ENCOUNTER — Encounter: Payer: Self-pay | Admitting: Hematology

## 2023-02-22 ENCOUNTER — Inpatient Hospital Stay (HOSPITAL_BASED_OUTPATIENT_CLINIC_OR_DEPARTMENT_OTHER): Payer: Medicare Other | Admitting: Nurse Practitioner

## 2023-02-22 VITALS — BP 139/75 | HR 97 | Temp 98.1°F | Resp 17 | Ht 61.0 in | Wt 131.3 lb

## 2023-02-22 DIAGNOSIS — R5383 Other fatigue: Secondary | ICD-10-CM | POA: Insufficient documentation

## 2023-02-22 DIAGNOSIS — Z79899 Other long term (current) drug therapy: Secondary | ICD-10-CM | POA: Insufficient documentation

## 2023-02-22 DIAGNOSIS — Z9221 Personal history of antineoplastic chemotherapy: Secondary | ICD-10-CM | POA: Insufficient documentation

## 2023-02-22 DIAGNOSIS — C210 Malignant neoplasm of anus, unspecified: Secondary | ICD-10-CM | POA: Insufficient documentation

## 2023-02-22 DIAGNOSIS — Z5111 Encounter for antineoplastic chemotherapy: Secondary | ICD-10-CM | POA: Insufficient documentation

## 2023-02-22 DIAGNOSIS — I10 Essential (primary) hypertension: Secondary | ICD-10-CM | POA: Insufficient documentation

## 2023-02-22 DIAGNOSIS — Z510 Encounter for antineoplastic radiation therapy: Secondary | ICD-10-CM | POA: Diagnosis not present

## 2023-02-22 DIAGNOSIS — R300 Dysuria: Secondary | ICD-10-CM | POA: Insufficient documentation

## 2023-02-22 DIAGNOSIS — D649 Anemia, unspecified: Secondary | ICD-10-CM | POA: Insufficient documentation

## 2023-02-22 DIAGNOSIS — D72819 Decreased white blood cell count, unspecified: Secondary | ICD-10-CM | POA: Insufficient documentation

## 2023-02-22 DIAGNOSIS — B182 Chronic viral hepatitis C: Secondary | ICD-10-CM | POA: Insufficient documentation

## 2023-02-22 DIAGNOSIS — K1230 Oral mucositis (ulcerative), unspecified: Secondary | ICD-10-CM | POA: Insufficient documentation

## 2023-02-22 DIAGNOSIS — Z923 Personal history of irradiation: Secondary | ICD-10-CM | POA: Insufficient documentation

## 2023-02-22 DIAGNOSIS — R7989 Other specified abnormal findings of blood chemistry: Secondary | ICD-10-CM | POA: Insufficient documentation

## 2023-02-22 LAB — CBC WITH DIFFERENTIAL (CANCER CENTER ONLY)
Abs Immature Granulocytes: 0.01 10*3/uL (ref 0.00–0.07)
Basophils Absolute: 0 10*3/uL (ref 0.0–0.1)
Basophils Relative: 0 %
Eosinophils Absolute: 0.2 10*3/uL (ref 0.0–0.5)
Eosinophils Relative: 5 %
HCT: 35.3 % — ABNORMAL LOW (ref 36.0–46.0)
Hemoglobin: 11.8 g/dL — ABNORMAL LOW (ref 12.0–15.0)
Immature Granulocytes: 0 %
Lymphocytes Relative: 14 %
Lymphs Abs: 0.5 10*3/uL — ABNORMAL LOW (ref 0.7–4.0)
MCH: 30.3 pg (ref 26.0–34.0)
MCHC: 33.4 g/dL (ref 30.0–36.0)
MCV: 90.5 fL (ref 80.0–100.0)
Monocytes Absolute: 0.6 10*3/uL (ref 0.1–1.0)
Monocytes Relative: 15 %
Neutro Abs: 2.4 10*3/uL (ref 1.7–7.7)
Neutrophils Relative %: 66 %
Platelet Count: 64 10*3/uL — ABNORMAL LOW (ref 150–400)
RBC: 3.9 MIL/uL (ref 3.87–5.11)
RDW: 12.2 % (ref 11.5–15.5)
WBC Count: 3.7 10*3/uL — ABNORMAL LOW (ref 4.0–10.5)
nRBC: 0 % (ref 0.0–0.2)

## 2023-02-22 LAB — CMP (CANCER CENTER ONLY)
ALT: 32 U/L (ref 0–44)
AST: 24 U/L (ref 15–41)
Albumin: 4.3 g/dL (ref 3.5–5.0)
Alkaline Phosphatase: 46 U/L (ref 38–126)
Anion gap: 4 — ABNORMAL LOW (ref 5–15)
BUN: 11 mg/dL (ref 8–23)
CO2: 30 mmol/L (ref 22–32)
Calcium: 9.8 mg/dL (ref 8.9–10.3)
Chloride: 101 mmol/L (ref 98–111)
Creatinine: 0.54 mg/dL (ref 0.44–1.00)
GFR, Estimated: >60 mL/min (ref >60)
Glucose, Bld: 100 mg/dL — ABNORMAL HIGH (ref 70–99)
Potassium: 4.7 mmol/L (ref 3.5–5.1)
Sodium: 135 mmol/L (ref 135–145)
Total Bilirubin: 0.5 mg/dL (ref 0.0–1.2)
Total Protein: 7 g/dL (ref 6.5–8.1)

## 2023-02-22 LAB — RAD ONC ARIA SESSION SUMMARY
Course Elapsed Days: 15
Plan Fractions Treated to Date: 12
Plan Prescribed Dose Per Fraction: 1.8 Gy
Plan Total Fractions Prescribed: 28
Plan Total Prescribed Dose: 50.4 Gy
Reference Point Dosage Given to Date: 21.6 Gy
Reference Point Session Dosage Given: 1.8 Gy
Session Number: 12

## 2023-02-22 MED ORDER — NYSTATIN 100000 UNIT/ML MT SUSP
5.0000 mL | Freq: Four times a day (QID) | OROMUCOSAL | 0 refills | Status: DC
  Filled 2023-02-22: qty 240, 12d supply, fill #0

## 2023-02-22 NOTE — Progress Notes (Signed)
 Patient Care Team: Lendia Boby CROME, NP-C as PCP - General (Family Medicine) Shila Gustav GAILS, MD as Consulting Physician (Gastroenterology) [facility] as Consulting Physician (Ophthalmology) Rennae Ferraiolo K, NP as Nurse Practitioner (Hematology and Oncology)   CHIEF COMPLAINT: Follow-up anal cancer  Oncology History  Anal cancer (HCC)  01/25/2023 Initial Diagnosis   Anal cancer (HCC)   02/07/2023 -  Chemotherapy   Patient is on Treatment Plan : ANUS Mitomycin  D1,28 + 5FU D1-4, 28-31 q32d     02/07/2023 Cancer Staging   Staging form: Anus, AJCC V9 - Clinical: Stage I (cT1, cN0, cM0) - Signed by Maylani Embree K, NP on 02/07/2023 Stage prefix: Initial diagnosis      CURRENT THERAPY:  Definitive concurrent chemoRT with 5FU/mitomycin , starting 02/07/23   INTERVAL HISTORY Ms. Frasier returns for follow-up as scheduled, last seen by me 02/07/2023 to start chemo RT, PICC was pulled 1/24, labs 1/27 remained normal.  She developed mouth sores the day after treatment, she managed with baking soda and prevention rinse she bought from the store and on a soft diet with soups and yogurts.  All but 1 have resolved.  She started to notice some diarrhea in the morning and mild pain with bowel movements, using perineal rinse.  She does not take anything for pain.  She has mild fatigue but remains functional able to take care of herself and her husband and all her normal activities although she is napping some which is unusual for her.  She has noticed some bruising and having blood-tinged nasal drainage but no overt epistaxis.  ROS  All other systems reviewed and negative  Past Medical History:  Diagnosis Date   Chronic hepatitis C without hepatic coma (HCC) 09/22/2016   finished treatment for this   Gallstone    known with h/o cholecystitis pain, no recent recurrence   Hypertension    no meds taken     Past Surgical History:  Procedure Laterality Date   BREAST BIOPSY  Left 2017   CHOLECYSTECTOMY N/A 08/04/2022   Procedure: LAPAROSCOPIC CHOLECYSTECTOMY;  Surgeon: Aron Shoulders, MD;  Location: [facility] OR;  Service: General;  Laterality: N/A;   COLONOSCOPY     MAXILLARY SINUS LIFT     MOUTH SURGERY     ORIF ANKLE FRACTURE Left 07/06/2018   Procedure: OPEN REDUCTION INTERNAL FIXATION (ORIF) left ankle trimalleolar fracture;  Surgeon: Kit Rush, MD;  Location: [facility];  Service: Orthopedics;  Laterality: Left;    TONSILLECTOMY     age 3   UPPER GASTROINTESTINAL ENDOSCOPY       Outpatient Encounter Medications as of 02/22/2023  Medication Sig   atorvastatin  (LIPITOR) 10 MG tablet Take 1 tablet (10 mg total) by mouth daily.   Cholecalciferol (VITAMIN D3) 50 MCG (2000 UT) TABS Take 2,000 Units by mouth in the morning and at bedtime.   ondansetron  (ZOFRAN ) 8 MG tablet Take 1 tablet (8 mg total) by mouth every 8 (eight) hours as needed for nausea or vomiting.   prochlorperazine  (COMPAZINE ) 10 MG tablet Take 1 tablet (10 mg total) by mouth every 6 (six) hours as needed for nausea or vomiting.   Facility-Administered Encounter Medications as of 02/22/2023  Medication   indocyanine green  (IC-GREEN ) injection 25 mg     Today's Vitals   02/22/23 0929 02/22/23 0944  BP: 139/75   Pulse: 97   Resp: 17   Temp: 98.1 F (36.7 C)   SpO2: 100%   Weight: 131  lb 5 oz (59.6 kg)   Height: 5' 1 (1.549 m)   PainSc:  3    Body mass index is 24.81 kg/m.   PHYSICAL EXAM GENERAL:alert, no distress and comfortable SKIN: no rash or ecchymoses EYES: sclera clear LUNGS:  normal breathing effort HEART:  no lower extremity edema NEURO: alert & oriented x 3 with fluent speech, no focal motor/sensory deficits Rectal: External exam reveals faint erythema. No ulceration, breakdown or discharge    CBC    Component Value Date/Time   WBC 3.7 (L) 02/22/2023 0913   WBC 10.1 07/28/2022 1333   RBC 3.90 02/22/2023 0913   HGB 11.8 (L) 02/22/2023 0913   HGB  13.5 09/26/2019 1102   HCT 35.3 (L) 02/22/2023 0913   HCT 40.6 09/26/2019 1102   PLT 64 (L) 02/22/2023 0913   PLT 311 09/26/2019 1102   MCV 90.5 02/22/2023 0913   MCV 90 09/26/2019 1102   MCH 30.3 02/22/2023 0913   MCHC 33.4 02/22/2023 0913   RDW 12.2 02/22/2023 0913   RDW 12.2 09/26/2019 1102   LYMPHSABS 0.5 (L) 02/22/2023 0913   LYMPHSABS 2.3 06/08/2017 1636   MONOABS 0.6 02/22/2023 0913   EOSABS 0.2 02/22/2023 0913   EOSABS 0.1 06/08/2017 1636   BASOSABS 0.0 02/22/2023 0913   BASOSABS 0.0 06/08/2017 1636     CMP     Component Value Date/Time   NA 135 02/22/2023 0913   NA 141 09/26/2019 1102   K 4.7 02/22/2023 0913   CL 101 02/22/2023 0913   CO2 30 02/22/2023 0913   GLUCOSE 100 (H) 02/22/2023 0913   BUN 11 02/22/2023 0913   BUN 9 09/26/2019 1102   CREATININE 0.54 02/22/2023 0913   CREATININE 0.45 (L) 09/22/2016 1350   CALCIUM  9.8 02/22/2023 0913   PROT 7.0 02/22/2023 0913   PROT 7.1 09/26/2019 1102   ALBUMIN 4.3 02/22/2023 0913   ALBUMIN 4.7 09/26/2019 1102   AST 24 02/22/2023 0913   ALT 32 02/22/2023 0913   ALT 37 (H) 09/22/2016 1350   ALKPHOS 46 02/22/2023 0913   BILITOT 0.5 02/22/2023 0913   GFRNONAA >60 02/22/2023 0913   GFRNONAA 107 09/22/2016 1350   GFRAA 113 09/26/2019 1102   GFRAA 124 09/22/2016 1350     ASSESSMENT & PLAN:67 yo female    Anal cancer, squamous cell carcinoma, cT1N0M0, stage I, P16+ -Screening colonoscopy shows a small mass at the most distal rectum, path confirmed SCC -Exam shows a nodular mass just above the anal verge -HIV negative in 2018 -Pelvic MRI 01/04/2023 showed slight asymmetric nodular thickening along the left side of the upper anal region, without definite extension through the internal sphincter, no abnormal lymph nodes -PET scan/30/24 was negative for lymphadenopathy or distant metastasis  -Surgical consult with Dr. Debby 01/24/23, appears to be fixed to the underlying structures does not appear to be surgically  resectable with negative margins and recommends proceeding with curative chemoradiation  -Consented to chemo; had first PT session. PICC placed 1/17; began ccRT 1/20 -Ms. Brosnahan appears stable, s/p week 2 chemo RT, tolerating well with mild fatigue, mucositis, diarrhea, and rectal pain.  Will call in Magic mouthwash and she will begin Imodium.  She does not require pain medication at this time.  Skin looks good -Labs reviewed, she has mild leukopenia and anemia with moderate thrombocytopenia platelets dropped to 64K.  Reviewed thrombocytopenic precautions.  I will add lab visit if she has signs of bleeding later this week -Lab and follow-up next week  as scheduled     PLAN: -Labs reviewed, thrombocytopenic precautions -Lab check later this week if needed (ie signs of bleeding) -Reviewed symptom management for fatigue, mucositis, diarrhea, pain -Rx Magic Mouthwash -Lab and follow-up 2/11 as scheduled, or sooner if needed    All questions were answered. The patient knows to call the clinic with any problems, questions or concerns. No barriers to learning were detected.   Kendale Rembold, NP-C 02/22/2023

## 2023-02-23 ENCOUNTER — Ambulatory Visit
Admission: RE | Admit: 2023-02-23 | Discharge: 2023-02-23 | Disposition: A | Discharge status: Home / Self Care | Payer: Medicare Other | Source: Ambulatory Visit | Attending: Radiation Oncology | Admitting: Radiation Oncology

## 2023-02-23 ENCOUNTER — Other Ambulatory Visit: Payer: Self-pay

## 2023-02-23 DIAGNOSIS — Z510 Encounter for antineoplastic radiation therapy: Secondary | ICD-10-CM | POA: Diagnosis not present

## 2023-02-23 LAB — RAD ONC ARIA SESSION SUMMARY
Course Elapsed Days: 16
Plan Fractions Treated to Date: 13
Plan Prescribed Dose Per Fraction: 1.8 Gy
Plan Total Fractions Prescribed: 28
Plan Total Prescribed Dose: 50.4 Gy
Reference Point Dosage Given to Date: 23.4 Gy
Reference Point Session Dosage Given: 1.8 Gy
Session Number: 13

## 2023-02-24 ENCOUNTER — Other Ambulatory Visit (HOSPITAL_COMMUNITY): Payer: Self-pay

## 2023-02-24 ENCOUNTER — Other Ambulatory Visit: Payer: Self-pay

## 2023-02-24 ENCOUNTER — Ambulatory Visit
Admission: RE | Admit: 2023-02-24 | Discharge: 2023-02-24 | Disposition: A | Discharge status: Home / Self Care | Payer: Medicare Other | Source: Ambulatory Visit | Attending: Radiation Oncology | Admitting: Radiation Oncology

## 2023-02-24 DIAGNOSIS — Z510 Encounter for antineoplastic radiation therapy: Secondary | ICD-10-CM | POA: Diagnosis not present

## 2023-02-24 LAB — RAD ONC ARIA SESSION SUMMARY
Course Elapsed Days: 17
Plan Fractions Treated to Date: 14
Plan Prescribed Dose Per Fraction: 1.8 Gy
Plan Total Fractions Prescribed: 28
Plan Total Prescribed Dose: 50.4 Gy
Reference Point Dosage Given to Date: 25.2 Gy
Reference Point Session Dosage Given: 1.8 Gy
Session Number: 14

## 2023-02-25 ENCOUNTER — Ambulatory Visit
Admission: RE | Admit: 2023-02-25 | Discharge: 2023-02-25 | Disposition: A | Discharge status: Home / Self Care | Payer: Medicare Other | Source: Ambulatory Visit | Attending: Radiation Oncology

## 2023-02-25 ENCOUNTER — Ambulatory Visit
Admission: RE | Admit: 2023-02-25 | Discharge: 2023-02-25 | Disposition: A | Discharge status: Home / Self Care | Payer: Medicare Other | Source: Ambulatory Visit | Attending: Radiation Oncology | Admitting: Radiation Oncology

## 2023-02-25 ENCOUNTER — Other Ambulatory Visit: Payer: Self-pay

## 2023-02-25 DIAGNOSIS — Z510 Encounter for antineoplastic radiation therapy: Secondary | ICD-10-CM | POA: Diagnosis not present

## 2023-02-25 LAB — RAD ONC ARIA SESSION SUMMARY
Course Elapsed Days: 18
Plan Fractions Treated to Date: 15
Plan Prescribed Dose Per Fraction: 1.8 Gy
Plan Total Fractions Prescribed: 28
Plan Total Prescribed Dose: 50.4 Gy
Reference Point Dosage Given to Date: 27 Gy
Reference Point Session Dosage Given: 1.8 Gy
Session Number: 15

## 2023-02-28 ENCOUNTER — Ambulatory Visit
Admission: RE | Admit: 2023-02-28 | Discharge: 2023-02-28 | Disposition: A | Discharge status: Home / Self Care | Payer: Medicare Other | Source: Ambulatory Visit | Attending: Radiation Oncology | Admitting: Radiation Oncology

## 2023-02-28 ENCOUNTER — Other Ambulatory Visit: Payer: Self-pay

## 2023-02-28 DIAGNOSIS — Z510 Encounter for antineoplastic radiation therapy: Secondary | ICD-10-CM | POA: Diagnosis not present

## 2023-02-28 LAB — RAD ONC ARIA SESSION SUMMARY
Course Elapsed Days: 21
Plan Fractions Treated to Date: 16
Plan Prescribed Dose Per Fraction: 1.8 Gy
Plan Total Fractions Prescribed: 28
Plan Total Prescribed Dose: 50.4 Gy
Reference Point Dosage Given to Date: 28.8 Gy
Reference Point Session Dosage Given: 1.8 Gy
Session Number: 16

## 2023-02-28 NOTE — Assessment & Plan Note (Signed)
 cT1N0M0 stage I -Discussed screening colonoscopy -Seen by provider secondary Andy Bannister, felt not to be a candidate for endoscopy resection. -Patient started concurrent chemoradiation on February 07, 2023 with mitomycin  and 5-FU regimen

## 2023-03-01 ENCOUNTER — Encounter: Payer: Self-pay | Admitting: Hematology

## 2023-03-01 ENCOUNTER — Ambulatory Visit
Admission: RE | Admit: 2023-03-01 | Discharge: 2023-03-01 | Disposition: A | Discharge status: Home / Self Care | Payer: Medicare Other | Source: Ambulatory Visit | Attending: Radiation Oncology | Admitting: Radiation Oncology

## 2023-03-01 ENCOUNTER — Other Ambulatory Visit: Payer: Self-pay

## 2023-03-01 ENCOUNTER — Inpatient Hospital Stay (HOSPITAL_BASED_OUTPATIENT_CLINIC_OR_DEPARTMENT_OTHER): Payer: Medicare Other | Admitting: Hematology

## 2023-03-01 ENCOUNTER — Inpatient Hospital Stay: Payer: Medicare Other

## 2023-03-01 VITALS — BP 136/69 | HR 88 | Temp 98.1°F | Resp 17 | Wt 131.2 lb

## 2023-03-01 DIAGNOSIS — C210 Malignant neoplasm of anus, unspecified: Secondary | ICD-10-CM

## 2023-03-01 DIAGNOSIS — Z510 Encounter for antineoplastic radiation therapy: Secondary | ICD-10-CM | POA: Diagnosis not present

## 2023-03-01 LAB — CMP (CANCER CENTER ONLY)
ALT: 20 U/L (ref 0–44)
AST: 15 U/L (ref 15–41)
Albumin: 4 g/dL (ref 3.5–5.0)
Alkaline Phosphatase: 43 U/L (ref 38–126)
Anion gap: 4 — ABNORMAL LOW (ref 5–15)
BUN: 13 mg/dL (ref 8–23)
CO2: 30 mmol/L (ref 22–32)
Calcium: 9.4 mg/dL (ref 8.9–10.3)
Chloride: 103 mmol/L (ref 98–111)
Creatinine: 0.51 mg/dL (ref 0.44–1.00)
GFR, Estimated: >60 mL/min (ref >60)
Glucose, Bld: 105 mg/dL — ABNORMAL HIGH (ref 70–99)
Potassium: 4.2 mmol/L (ref 3.5–5.1)
Sodium: 137 mmol/L (ref 135–145)
Total Bilirubin: 0.5 mg/dL (ref 0.0–1.2)
Total Protein: 6.4 g/dL — ABNORMAL LOW (ref 6.5–8.1)

## 2023-03-01 LAB — RAD ONC ARIA SESSION SUMMARY
Course Elapsed Days: 22
Plan Fractions Treated to Date: 17
Plan Prescribed Dose Per Fraction: 1.8 Gy
Plan Total Fractions Prescribed: 28
Plan Total Prescribed Dose: 50.4 Gy
Reference Point Dosage Given to Date: 30.6 Gy
Reference Point Session Dosage Given: 1.8 Gy
Session Number: 17

## 2023-03-01 LAB — CBC WITH DIFFERENTIAL (CANCER CENTER ONLY)
Abs Immature Granulocytes: 0.01 10*3/uL (ref 0.00–0.07)
Basophils Absolute: 0 10*3/uL (ref 0.0–0.1)
Basophils Relative: 0 %
Eosinophils Absolute: 0.1 10*3/uL (ref 0.0–0.5)
Eosinophils Relative: 3 %
HCT: 32.7 % — ABNORMAL LOW (ref 36.0–46.0)
Hemoglobin: 10.8 g/dL — ABNORMAL LOW (ref 12.0–15.0)
Immature Granulocytes: 0 %
Lymphocytes Relative: 8 %
Lymphs Abs: 0.4 10*3/uL — ABNORMAL LOW (ref 0.7–4.0)
MCH: 30.6 pg (ref 26.0–34.0)
MCHC: 33 g/dL (ref 30.0–36.0)
MCV: 92.6 fL (ref 80.0–100.0)
Monocytes Absolute: 0.6 10*3/uL (ref 0.1–1.0)
Monocytes Relative: 12 %
Neutro Abs: 3.8 10*3/uL (ref 1.7–7.7)
Neutrophils Relative %: 77 %
Platelet Count: 100 10*3/uL — ABNORMAL LOW (ref 150–400)
RBC: 3.53 MIL/uL — ABNORMAL LOW (ref 3.87–5.11)
RDW: 13.4 % (ref 11.5–15.5)
WBC Count: 5 10*3/uL (ref 4.0–10.5)
nRBC: 0 % (ref 0.0–0.2)

## 2023-03-01 NOTE — Progress Notes (Signed)
[facility]   [phone] [phone]) [phone]   Clinic Follow up Note   Patient Care Team: Avanell Shackleton, NP-C as PCP - General (Family Medicine) Napoleon Form, MD as Consulting Physician (Gastroenterology) [facility] as Consulting Physician (Ophthalmology) Pollyann Samples, NP as Nurse Practitioner (Hematology and Oncology)  Date of Service:  03/01/2023  CHIEF COMPLAINT: f/u of anal cancer  CURRENT THERAPY:  concurrent chemoradiation   Oncology History   Anal cancer (HCC) cT1N0M0 stage I -Discussed screening colonoscopy -Seen by provider secondary Maisie Fus, felt not to be a candidate for endoscopy resection. -Patient started concurrent chemoradiation on February 07, 2023 with mitomycin and 5-FU regimen     Assessment and Plan    Anal Cancer 68 year old female undergoing radiation therapy and chemotherapy for anal cancer. Reports increased anal itching, burning, and significant diarrhea lasting three hours this morning. No bleeding, fever, or chills. Blood counts recovering post-chemotherapy; white count and platelet count normal, mild anemia noted. External labia skin itching observed. Discussed hygiene maintenance, hydration, and electrolyte balance. Emphasized monitoring for dehydration and infection, especially during the final weeks of treatment. - Continue radiation therapy, last session on March 16, 2023 - Continue chemotherapy as scheduled next week - Schedule PICC line placement - Monitor for fever and signs of infection - Use Tylenol for rectal discomfort - Maintain hygiene, especially after bowel movements - Use Imodium for diarrhea, up to 8 tablets per day if needed - Hydrate with electrolytes, consider using Liquid IV - Call if diarrhea persists or signs of dehydration occur - Follow-up lab work on March 16, 2023  Diarrhea Increased frequency and severity of diarrhea, particularly in the mornings and  after meals. Managed with Imodium and dietary adjustments. Concerned about hydration and electrolyte balance. Discussed the use of up to 8 tablets of Imodium per day and the importance of hydration with electrolytes. - Use Imodium as needed, up to 8 tablets per day - Hydrate with electrolytes, consider using Liquid IV - Monitor for signs of dehydration - Call if diarrhea persists or signs of dehydration occur  Anemia Mild anemia noted, common in patients undergoing chemotherapy. Expected to persist until after chemotherapy is completed. Discussed the need for regular monitoring of blood counts. - Monitor blood counts regularly - Continue chemotherapy as scheduled  General Health Maintenance Advised to maintain hygiene and avoid infections, especially during flu and COVID season. Emphasized the importance of wearing a mask when going out to prevent infections that could interrupt treatment. - Wear a mask when going out - Monitor for signs of infection - Maintain hygiene, especially after bowel movements  Plan -Lab reviewed, she will continue daily radiation - Follow-up chemotherapy next Monday - Schedule PICC line placement before chemo on February 17         SUMMARY OF ONCOLOGIC HISTORY: Oncology History  Anal cancer (HCC)  01/25/2023 Initial Diagnosis   Anal cancer (HCC)   02/07/2023 -  Chemotherapy   Patient is on Treatment Plan : ANUS Mitomycin D1,28 + 5FU D1-4, 28-31 q32d     02/07/2023 Cancer Staging   Staging form: Anus, AJCC V9 - Clinical: Stage I (cT1, cN0, cM0) - Signed by Pollyann Samples, NP on 02/07/2023 Stage prefix: Initial diagnosis      Discussed the use of AI scribe software for clinical note transcription with the patient, who gave verbal consent to proceed.  History of Present Illness   The patient, a 68 year old female with a history of anal  cancer, presents for a follow-up appointment. She reports experiencing increased itching and burning, likely side  effects from her ongoing radiation therapy. She also notes a significant increase in diarrhea, particularly in the mornings and after meals. Despite these symptoms, she has been able to maintain her weight. She has been managing her symptoms with Imodium and hydration, but notes that her sodium levels have historically been low. She also reports hair loss, a common side effect of chemotherapy, but notes that mouth sores she had previously developed have resolved. She denies any bleeding or changes in her anal area, and has been maintaining hygiene with a rinse bottle and damp toilet paper. She has not experienced any nausea or vomiting.         All other systems were reviewed with the patient and are negative.  MEDICAL HISTORY:  Past Medical History:  Diagnosis Date   Chronic hepatitis C without hepatic coma (HCC) 09/22/2016   finished treatment for this   Gallstone    known with h/o cholecystitis pain, no recent recurrence   Hypertension    no meds taken    SURGICAL HISTORY: Past Surgical History:  Procedure Laterality Date   BREAST BIOPSY Left 2017   CHOLECYSTECTOMY N/A 08/04/2022   Procedure: LAPAROSCOPIC CHOLECYSTECTOMY;  Surgeon: Almond Lint, MD;  Location: [facility] OR;  Service: General;  Laterality: N/A;   COLONOSCOPY     MAXILLARY SINUS LIFT     MOUTH SURGERY     ORIF ANKLE FRACTURE Left 07/06/2018   Procedure: OPEN REDUCTION INTERNAL FIXATION (ORIF) left ankle trimalleolar fracture;  Surgeon: Toni Arthurs, MD;  Location: [facility];  Service: Orthopedics;  Laterality: Left;    TONSILLECTOMY     age 68   UPPER GASTROINTESTINAL ENDOSCOPY      I have reviewed the social history and family history with the patient and they are unchanged from previous note.  ALLERGIES:  is allergic to penicillins.  MEDICATIONS:  Current Outpatient Medications  Medication Sig Dispense Refill   atorvastatin (LIPITOR) 10 MG tablet Take 1 tablet (10 mg total) by mouth daily. 90  tablet 3   Cholecalciferol (VITAMIN D3) 50 MCG (2000 UT) TABS Take 2,000 Units by mouth in the morning and at bedtime.     magic mouthwash (nystatin, lidocaine, diphenhydrAMINE, alum & mag hydroxide) suspension Swish 5 mLs to coat mouth and spit out 4 (four) times daily. 240 mL 0   ondansetron (ZOFRAN) 8 MG tablet Take 1 tablet (8 mg total) by mouth every 8 (eight) hours as needed for nausea or vomiting. 30 tablet 1   prochlorperazine (COMPAZINE) 10 MG tablet Take 1 tablet (10 mg total) by mouth every 6 (six) hours as needed for nausea or vomiting. 30 tablet 1   No current facility-administered medications for this visit.   Facility-Administered Medications Ordered in Other Visits  Medication Dose Route Frequency Provider Last Rate Last Admin   indocyanine green (IC-GREEN) injection 25 mg  25 mg Topical Once Almond Lint, MD        PHYSICAL EXAMINATION: ECOG PERFORMANCE STATUS: 1 - Symptomatic but completely ambulatory  Vitals:   03/01/23 1008  BP: 136/69  Pulse: 88  Resp: 17  Temp: 98.1 F (36.7 C)  SpO2: 100%   Wt Readings from Last 3 Encounters:  03/01/23 59.5 kg  02/22/23 59.6 kg  02/07/23 60.1 kg     GENERAL:alert, no distress and comfortable SKIN: skin color, texture, turgor are normal, no rashes or significant lesions EYES: normal, Conjunctiva  are pink and non-injected, sclera clear NECK: supple, thyroid normal size, non-tender, without nodularity LYMPH:  no palpable lymphadenopathy in the cervical, axillary  LUNGS: clear to auscultation and percussion with normal breathing effort HEART: regular rate & rhythm and no murmurs and no lower extremity edema ABDOMEN:abdomen soft, non-tender and normal bowel sounds Musculoskeletal:no cyanosis of digits and no clubbing  NEURO: alert & oriented x 3 with fluent speech, no focal motor/sensory deficits   LABORATORY DATA:  I have reviewed the data as listed    Latest Ref Rng & Units 03/01/2023    9:30 AM 02/22/2023    9:13 AM  02/14/2023   11:53 AM  CBC  WBC 4.0 - 10.5 K/uL 5.0  3.7  5.5   Hemoglobin 12.0 - 15.0 g/dL 04.5  40.9  81.1   Hematocrit 36.0 - 46.0 % 32.7  35.3  37.8   Platelets 150 - 400 K/uL 100  64  193         Latest Ref Rng & Units 03/01/2023    9:30 AM 02/22/2023    9:13 AM 02/14/2023   11:53 AM  CMP  Glucose 70 - 99 mg/dL 914  782  98   BUN 8 - 23 mg/dL 13  11  15    Creatinine 0.44 - 1.00 mg/dL 9.56  2.13  0.86   Sodium 135 - 145 mmol/L 137  135  135   Potassium 3.5 - 5.1 mmol/L 4.2  4.7  4.5   Chloride 98 - 111 mmol/L 103  101  102   CO2 22 - 32 mmol/L 30  30  28    Calcium 8.9 - 10.3 mg/dL 9.4  9.8  9.7   Total Protein 6.5 - 8.1 g/dL 6.4  7.0  7.2   Total Bilirubin 0.0 - 1.2 mg/dL 0.5  0.5  0.7   Alkaline Phos 38 - 126 U/L 43  46  43   AST 15 - 41 U/L 15  24  17    ALT 0 - 44 U/L 20  32  18       RADIOGRAPHIC STUDIES: I have personally reviewed the radiological images as listed and agreed with the findings in the report. No results found.    No orders of the defined types were placed in this encounter.  All questions were answered. The patient knows to call the clinic with any problems, questions or concerns. No barriers to learning was detected. The total time spent in the appointment was 25 minutes.     Malachy Mood, MD 03/01/2023

## 2023-03-02 ENCOUNTER — Other Ambulatory Visit: Payer: Self-pay

## 2023-03-02 ENCOUNTER — Ambulatory Visit
Admission: RE | Admit: 2023-03-02 | Discharge: 2023-03-02 | Disposition: A | Discharge status: Home / Self Care | Payer: Medicare Other | Source: Ambulatory Visit | Attending: Radiation Oncology | Admitting: Radiation Oncology

## 2023-03-02 DIAGNOSIS — Z510 Encounter for antineoplastic radiation therapy: Secondary | ICD-10-CM | POA: Diagnosis not present

## 2023-03-02 LAB — RAD ONC ARIA SESSION SUMMARY
Course Elapsed Days: 23
Plan Fractions Treated to Date: 18
Plan Prescribed Dose Per Fraction: 1.8 Gy
Plan Total Fractions Prescribed: 28
Plan Total Prescribed Dose: 50.4 Gy
Reference Point Dosage Given to Date: 32.4 Gy
Reference Point Session Dosage Given: 1.8 Gy
Session Number: 18

## 2023-03-03 ENCOUNTER — Ambulatory Visit
Admission: RE | Admit: 2023-03-03 | Discharge: 2023-03-03 | Disposition: A | Discharge status: Home / Self Care | Payer: Medicare Other | Source: Ambulatory Visit | Attending: Radiation Oncology | Admitting: Radiation Oncology

## 2023-03-03 ENCOUNTER — Other Ambulatory Visit: Payer: Self-pay

## 2023-03-03 DIAGNOSIS — Z510 Encounter for antineoplastic radiation therapy: Secondary | ICD-10-CM | POA: Diagnosis not present

## 2023-03-03 LAB — RAD ONC ARIA SESSION SUMMARY
Course Elapsed Days: 24
Plan Fractions Treated to Date: 19
Plan Prescribed Dose Per Fraction: 1.8 Gy
Plan Total Fractions Prescribed: 28
Plan Total Prescribed Dose: 50.4 Gy
Reference Point Dosage Given to Date: 34.2 Gy
Reference Point Session Dosage Given: 1.8 Gy
Session Number: 19

## 2023-03-04 ENCOUNTER — Ambulatory Visit
Admission: RE | Admit: 2023-03-04 | Discharge: 2023-03-04 | Disposition: A | Discharge status: Home / Self Care | Payer: Medicare Other | Source: Ambulatory Visit | Attending: Radiation Oncology | Admitting: Radiation Oncology

## 2023-03-04 ENCOUNTER — Other Ambulatory Visit: Payer: Self-pay

## 2023-03-04 DIAGNOSIS — Z510 Encounter for antineoplastic radiation therapy: Secondary | ICD-10-CM | POA: Diagnosis not present

## 2023-03-04 LAB — RAD ONC ARIA SESSION SUMMARY
Course Elapsed Days: 25
Plan Fractions Treated to Date: 20
Plan Prescribed Dose Per Fraction: 1.8 Gy
Plan Total Fractions Prescribed: 28
Plan Total Prescribed Dose: 50.4 Gy
Reference Point Dosage Given to Date: 36 Gy
Reference Point Session Dosage Given: 1.8 Gy
Session Number: 20

## 2023-03-06 NOTE — Assessment & Plan Note (Signed)
cT1N0M0 stage I -Discussed screening colonoscopy -Seen by provider secondary Maisie Fus, felt not to be a candidate for endoscopy resection. -Patient started concurrent chemoradiation on February 07, 2023 with mitomycin and 5-FU regimen, tolerating treatment well overall

## 2023-03-07 ENCOUNTER — Ambulatory Visit
Admission: RE | Admit: 2023-03-07 | Discharge: 2023-03-07 | Disposition: A | Discharge status: Home / Self Care | Payer: Medicare Other | Source: Ambulatory Visit | Attending: Radiation Oncology

## 2023-03-07 ENCOUNTER — Other Ambulatory Visit: Payer: Self-pay | Admitting: *Deleted

## 2023-03-07 ENCOUNTER — Inpatient Hospital Stay (HOSPITAL_BASED_OUTPATIENT_CLINIC_OR_DEPARTMENT_OTHER): Payer: Medicare Other | Admitting: Hematology

## 2023-03-07 ENCOUNTER — Other Ambulatory Visit: Payer: Self-pay

## 2023-03-07 ENCOUNTER — Inpatient Hospital Stay: Payer: Medicare Other

## 2023-03-07 ENCOUNTER — Other Ambulatory Visit (HOSPITAL_COMMUNITY): Payer: Self-pay

## 2023-03-07 ENCOUNTER — Ambulatory Visit (HOSPITAL_COMMUNITY)
Admission: RE | Admit: 2023-03-07 | Discharge: 2023-03-07 | Disposition: A | Discharge status: Home / Self Care | Payer: Medicare Other | Source: Ambulatory Visit | Attending: Hematology | Admitting: Hematology

## 2023-03-07 ENCOUNTER — Encounter: Payer: Self-pay | Admitting: Hematology

## 2023-03-07 ENCOUNTER — Other Ambulatory Visit: Payer: Medicare Other

## 2023-03-07 VITALS — BP 143/66 | HR 94 | Temp 98.0°F | Resp 17 | Wt 131.7 lb

## 2023-03-07 DIAGNOSIS — C210 Malignant neoplasm of anus, unspecified: Secondary | ICD-10-CM

## 2023-03-07 DIAGNOSIS — Z510 Encounter for antineoplastic radiation therapy: Secondary | ICD-10-CM | POA: Diagnosis not present

## 2023-03-07 LAB — CBC WITH DIFFERENTIAL (CANCER CENTER ONLY)
Abs Immature Granulocytes: 0.02 10*3/uL (ref 0.00–0.07)
Basophils Absolute: 0 10*3/uL (ref 0.0–0.1)
Basophils Relative: 0 %
Eosinophils Absolute: 0.3 10*3/uL (ref 0.0–0.5)
Eosinophils Relative: 6 %
HCT: 30.1 % — ABNORMAL LOW (ref 36.0–46.0)
Hemoglobin: 9.9 g/dL — ABNORMAL LOW (ref 12.0–15.0)
Immature Granulocytes: 1 %
Lymphocytes Relative: 8 %
Lymphs Abs: 0.3 10*3/uL — ABNORMAL LOW (ref 0.7–4.0)
MCH: 30.7 pg (ref 26.0–34.0)
MCHC: 32.9 g/dL (ref 30.0–36.0)
MCV: 93.5 fL (ref 80.0–100.0)
Monocytes Absolute: 0.4 10*3/uL (ref 0.1–1.0)
Monocytes Relative: 9 %
Neutro Abs: 3.2 10*3/uL (ref 1.7–7.7)
Neutrophils Relative %: 76 %
Platelet Count: 153 10*3/uL (ref 150–400)
RBC: 3.22 MIL/uL — ABNORMAL LOW (ref 3.87–5.11)
RDW: 14.2 % (ref 11.5–15.5)
WBC Count: 4.3 10*3/uL (ref 4.0–10.5)
nRBC: 0 % (ref 0.0–0.2)

## 2023-03-07 LAB — CMP (CANCER CENTER ONLY)
ALT: 34 U/L (ref 0–44)
AST: 23 U/L (ref 15–41)
Albumin: 3.5 g/dL (ref 3.5–5.0)
Alkaline Phosphatase: 37 U/L — ABNORMAL LOW (ref 38–126)
Anion gap: 8 (ref 5–15)
BUN: 13 mg/dL (ref 8–23)
CO2: 25 mmol/L (ref 22–32)
Calcium: 8.9 mg/dL (ref 8.9–10.3)
Chloride: 103 mmol/L (ref 98–111)
Creatinine: 0.4 mg/dL — ABNORMAL LOW (ref 0.44–1.00)
GFR, Estimated: >60 mL/min (ref >60)
Glucose, Bld: 109 mg/dL — ABNORMAL HIGH (ref 70–99)
Potassium: 3.6 mmol/L (ref 3.5–5.1)
Sodium: 136 mmol/L (ref 135–145)
Total Bilirubin: 0.3 mg/dL (ref 0.0–1.2)
Total Protein: 6.6 g/dL (ref 6.5–8.1)

## 2023-03-07 LAB — RAD ONC ARIA SESSION SUMMARY
Course Elapsed Days: 28
Plan Fractions Treated to Date: 21
Plan Prescribed Dose Per Fraction: 1.8 Gy
Plan Total Fractions Prescribed: 28
Plan Total Prescribed Dose: 50.4 Gy
Reference Point Dosage Given to Date: 37.8 Gy
Reference Point Session Dosage Given: 1.8 Gy
Session Number: 21

## 2023-03-07 MED ORDER — HEPARIN SOD (PORK) LOCK FLUSH 100 UNIT/ML IV SOLN
500.0000 [IU] | Freq: Once | INTRAVENOUS | Status: AC
  Administered 2023-03-07: 500 [IU] via INTRAVENOUS

## 2023-03-07 MED ORDER — HEPARIN SOD (PORK) LOCK FLUSH 100 UNIT/ML IV SOLN: 250.0000 [IU] | Freq: Once | INTRAVENOUS | Status: DC | PRN

## 2023-03-07 MED ORDER — LIDOCAINE HCL 1 % IJ SOLN
INTRAMUSCULAR | Status: AC
  Filled 2023-03-07: qty 20

## 2023-03-07 MED ORDER — MITOMYCIN CHEMO IV INJECTION 20 MG
10.0000 mg/m2 | Freq: Once | INTRAVENOUS | Status: AC
  Administered 2023-03-07: 16 mg via INTRAVENOUS
  Filled 2023-03-07: qty 32

## 2023-03-07 MED ORDER — HEPARIN SOD (PORK) LOCK FLUSH 100 UNIT/ML IV SOLN
INTRAVENOUS | Status: AC
  Filled 2023-03-07: qty 5

## 2023-03-07 MED ORDER — SODIUM CHLORIDE 0.9% FLUSH
3.0000 mL | INTRAVENOUS | Status: DC | PRN
  Administered 2023-03-07: 3 mL

## 2023-03-07 MED ORDER — SODIUM CHLORIDE 0.9 % IV SOLN
1000.0000 mg/m2/d | INTRAVENOUS | Status: DC
  Administered 2023-03-07: 7000 mg via INTRAVENOUS
  Filled 2023-03-07: qty 140

## 2023-03-07 MED ORDER — SODIUM CHLORIDE 0.9 % IV SOLN: INTRAVENOUS | Status: DC

## 2023-03-07 MED ORDER — PROCHLORPERAZINE MALEATE 10 MG PO TABS
10.0000 mg | ORAL_TABLET | Freq: Once | ORAL | Status: AC
  Administered 2023-03-07: 10 mg via ORAL
  Filled 2023-03-07: qty 1

## 2023-03-07 MED ORDER — LIDOCAINE HCL 1 % IJ SOLN
20.0000 mL | Freq: Once | INTRAMUSCULAR | Status: AC
  Administered 2023-03-07: 3 mL via INTRADERMAL

## 2023-03-07 NOTE — Progress Notes (Signed)
[facility]   [phone] [phone]   Clinic Follow up Note   Patient Care Team: Avanell Shackleton, NP-C as PCP - General (Family Medicine) Napoleon Form, MD as Consulting Physician (Gastroenterology) [facility] as Consulting Physician (Ophthalmology) Pollyann Samples, NP as Nurse Practitioner (Hematology and Oncology)  Date of Service:  03/07/2023  CHIEF COMPLAINT: f/u of anal cancer  CURRENT THERAPY:  Concurrent chemoradiation  Oncology History   Anal cancer (HCC) cT1N0M0 stage I -Discussed screening colonoscopy -Seen by provider secondary Maisie Fus, felt not to be a candidate for endoscopy resection. -Patient started concurrent chemoradiation on February 07, 2023 with mitomycin and 5-FU regimen, tolerating treatment well overall    Assessment and Plan    Anal Cancer 68 year old female undergoing treatment for anal cancer. She had a PICC line placed this morning and completed radiation therapy. Reports manageable symptoms including intermittent dysuria and variable bowel movements. No fever reported. Labs from this morning are in question as she was not aware of any blood draw. - Confirm lab results and redraw if necessary - Proceed with chemotherapy infusion - Schedule PICC line removal for Friday - Follow-up appointment with Herbert Seta next Wednesday - Monitor for mucositis and diarrhea as potential side effects of chemotherapy  Bowel Movement Irregularities Bowel movements 2-3 times daily, occasionally loose. Using loperamide as needed. - Continue loperamide as needed - Monitor bowel movement frequency and consistency  Urinary Symptoms Intermittent dysuria without fever. Using cranberry juice and phenazopyridine as needed. - Continue cranberry juice and phenazopyridine as needed - Monitor for signs of infection such as fever.     Plan -Lab reviewed (and confirmed it's her blood), adequate for treatment, will  proceed day 29 treatments today -Lab and follow-up next week    SUMMARY OF ONCOLOGIC HISTORY: Oncology History  Anal cancer (HCC)  01/25/2023 Initial Diagnosis   Anal cancer (HCC)   02/07/2023 -  Chemotherapy   Patient is on Treatment Plan : ANUS Mitomycin D1,28 + 5FU D1-4, 28-31 q32d     02/07/2023 Cancer Staging   Staging form: Anus, AJCC V9 - Clinical: Stage I (cT1, cN0, cM0) - Signed by Pollyann Samples, NP on 02/07/2023 Stage prefix: Initial diagnosis      Discussed the use of AI scribe software for clinical note transcription with the patient, who gave verbal consent to proceed.  History of Present Illness   [name] with a known diagnosis of anal cancer presents for a follow-up visit. The patient reports a mild burning sensation during urination, which is intermittent in nature. She has been monitoring her temperature at home to rule out any signs of infection. The patient has been advised to consume cranberry juice and use Azel as needed.  Regarding bowel movements, the patient reports going two to three times in the morning, with the consistency varying from day to day. She has been managing any loose stools with Imodium as needed. The patient denies any significant pain and reports that her symptoms are currently manageable.  The patient also has a PICC line in place for chemotherapy. There was some confusion regarding the scheduling of her treatment and the need for lab work. The patient was not aware of any blood draw that occurred today.         All other systems were reviewed with the patient and are negative.  MEDICAL HISTORY:  Past Medical History:  Diagnosis Date   Chronic hepatitis C without hepatic coma (HCC) 09/22/2016  finished treatment for this   Gallstone    known with h/o cholecystitis pain, no recent recurrence   Hypertension    no meds taken    SURGICAL HISTORY: Past Surgical History:  Procedure Laterality Date   BREAST BIOPSY Left  2017   CHOLECYSTECTOMY N/A 08/04/2022   Procedure: LAPAROSCOPIC CHOLECYSTECTOMY;  Surgeon: Almond Lint, MD;  Location: [facility] OR;  Service: General;  Laterality: N/A;   COLONOSCOPY     MAXILLARY SINUS LIFT     MOUTH SURGERY     ORIF ANKLE FRACTURE Left 07/06/2018   Procedure: OPEN REDUCTION INTERNAL FIXATION (ORIF) left ankle trimalleolar fracture;  Surgeon: Toni Arthurs, MD;  Location:  SURGERY CENTER;  Service: Orthopedics;  Laterality: Left;    TONSILLECTOMY     age 25   UPPER GASTROINTESTINAL ENDOSCOPY      I have reviewed the social history and family history with the patient and they are unchanged from previous note.  ALLERGIES:  is allergic to penicillins.  MEDICATIONS:  Current Outpatient Medications  Medication Sig Dispense Refill   atorvastatin (LIPITOR) 10 MG tablet Take 1 tablet (10 mg total) by mouth daily. 90 tablet 3   Cholecalciferol (VITAMIN D3) 50 MCG (2000 UT) TABS Take 2,000 Units by mouth in the morning and at bedtime.     magic mouthwash (nystatin, lidocaine, diphenhydrAMINE, alum & mag hydroxide) suspension Swish 5 mLs to coat mouth and spit out 4 (four) times daily. 240 mL 0   ondansetron (ZOFRAN) 8 MG tablet Take 1 tablet (8 mg total) by mouth every 8 (eight) hours as needed for nausea or vomiting. 30 tablet 1   prochlorperazine (COMPAZINE) 10 MG tablet Take 1 tablet (10 mg total) by mouth every 6 (six) hours as needed for nausea or vomiting. 30 tablet 1   No current facility-administered medications for this visit.   Facility-Administered Medications Ordered in Other Visits  Medication Dose Route Frequency Provider Last Rate Last Admin   0.9 %  sodium chloride infusion   Intravenous Continuous Malachy Mood, MD   Stopped at 03/07/23 1328   fluorouracil (ADRUCIL) 7,000 mg in sodium chloride 0.9 % 110 mL chemo infusion  1,000 mg/m2/day (Treatment Plan Recorded) Intravenous 4 days Malachy Mood, MD   Infusion Verify at 03/07/23 1343   heparin lock flush 100  unit/mL  250 Units Intracatheter Once PRN Malachy Mood, MD       indocyanine green (IC-GREEN) injection 25 mg  25 mg Topical Once Almond Lint, MD       sodium chloride flush (NS) 0.9 % injection 3 mL  3 mL Intracatheter PRN Malachy Mood, MD   3 mL at 03/07/23 1329    PHYSICAL EXAMINATION: ECOG PERFORMANCE STATUS: 1 - Symptomatic but completely ambulatory  Vitals:   03/07/23 1144  BP: (!) 143/66  Pulse: 94  Resp: 17  Temp: 98 F (36.7 C)  SpO2: 100%   Wt Readings from Last 3 Encounters:  03/07/23 131 lb 11.2 oz (59.7 kg)  03/01/23 131 lb 3.2 oz (59.5 kg)  02/22/23 131 lb 5 oz (59.6 kg)     GENERAL:alert, no distress and comfortable SKIN: skin color, texture, turgor are normal, no rashes or significant lesions EYES: normal, Conjunctiva are pink and non-injected, sclera clear NECK: supple, thyroid normal size, non-tender, without nodularity LYMPH:  no palpable lymphadenopathy in the cervical, axillary  LUNGS: clear to auscultation and percussion with normal breathing effort HEART: regular rate & rhythm and no murmurs and no lower extremity edema  ABDOMEN:abdomen soft, non-tender and normal bowel sounds Musculoskeletal:no cyanosis of digits and no clubbing  NEURO: alert & oriented x 3 with fluent speech, no focal motor/sensory deficits    LABORATORY DATA:  I have reviewed the data as listed    Latest Ref Rng & Units 03/07/2023    9:50 AM 03/01/2023    9:30 AM 02/22/2023    9:13 AM  CBC  WBC 4.0 - 10.5 K/uL 4.3  5.0  3.7   Hemoglobin 12.0 - 15.0 g/dL 9.9  40.9  81.1   Hematocrit 36.0 - 46.0 % 30.1  32.7  35.3   Platelets 150 - 400 K/uL 153  100  64         Latest Ref Rng & Units 03/07/2023    9:50 AM 03/01/2023    9:30 AM 02/22/2023    9:13 AM  CMP  Glucose 70 - 99 mg/dL 914  782  956   BUN 8 - 23 mg/dL 13  13  11    Creatinine 0.44 - 1.00 mg/dL 2.13  0.86  5.78   Sodium 135 - 145 mmol/L 136  137  135   Potassium 3.5 - 5.1 mmol/L 3.6  4.2  4.7   Chloride 98 - 111 mmol/L 103   103  101   CO2 22 - 32 mmol/L 25  30  30    Calcium 8.9 - 10.3 mg/dL 8.9  9.4  9.8   Total Protein 6.5 - 8.1 g/dL 6.6  6.4  7.0   Total Bilirubin 0.0 - 1.2 mg/dL 0.3  0.5  0.5   Alkaline Phos 38 - 126 U/L 37  43  46   AST 15 - 41 U/L 23  15  24    ALT 0 - 44 U/L 34  20  32       RADIOGRAPHIC STUDIES: I have personally reviewed the radiological images as listed and agreed with the findings in the report. IR PICC PLACEMENT RIGHT <5 YRS INC IMG GUIDE Result Date: 03/07/2023 INDICATION: Patient with history of anal cancer; central venous access requested for chemotherapy EXAM: RIGHT UPPER EXTREMITY PICC LINE PLACEMENT WITH ULTRASOUND AND FLUOROSCOPIC GUIDANCE MEDICATIONS: 3 ml 1% lidocaine to skin and subcutaneous tissue ANESTHESIA/SEDATION: None FLUOROSCOPY: Radiation Exposure Index (as provided by the fluoroscopic device): 1 mGy Kerma COMPLICATIONS: None immediate. PROCEDURE: The patient was advised of the possible risks and complications and agreed to undergo the procedure. The patient was then brought to the angiographic suite for the procedure. The right arm was prepped with chlorhexidine, draped in the usual sterile fashion using maximum barrier technique (cap and mask, sterile gown, sterile gloves, large sterile sheet, hand hygiene and cutaneous antisepsis) and infiltrated locally with 1% Lidocaine. Ultrasound demonstrated patency of the right basilic vein, and this was documented with an image. Under real-time ultrasound guidance, this vein was accessed with a 21 gauge micropuncture needle and image documentation was performed. A 0.018 wire was introduced in to the vein. Over this, a 5 [place] dual lumen power injectable PICC was advanced to the lower SVC/right atrial junction. Fluoroscopy during the procedure and fluoro spot radiograph confirms appropriate catheter position. The catheter was flushed and covered with a sterile dressing. Catheter length: 38 cm IMPRESSION: Successful right arm power  PICC line placement with ultrasound and fluoroscopic guidance. The catheter is ready for use. Performed by: Artemio Aly Electronically Signed   By: Corlis Leak M.D.   On: 03/07/2023 09:28      No orders of the defined  types were placed in this encounter.  All questions were answered. The patient knows to call the clinic with any problems, questions or concerns. No barriers to learning was detected. The total time spent in the appointment was 25 minutes.     Malachy Mood, MD 03/07/2023

## 2023-03-07 NOTE — Procedures (Signed)
Right double-lumen basilic vein PICC placed.  Length 38 cm.  Tip SVC/right atrial junction.  Medication used -1% lidocaine to skin and subcutaneous tissue.  Okay to use.  EBL less than 2 cc.

## 2023-03-08 ENCOUNTER — Other Ambulatory Visit: Payer: Self-pay

## 2023-03-08 ENCOUNTER — Ambulatory Visit
Admission: RE | Admit: 2023-03-08 | Discharge: 2023-03-08 | Disposition: A | Discharge status: Home / Self Care | Payer: Medicare Other | Source: Ambulatory Visit | Attending: Radiation Oncology

## 2023-03-08 DIAGNOSIS — Z510 Encounter for antineoplastic radiation therapy: Secondary | ICD-10-CM | POA: Diagnosis not present

## 2023-03-08 LAB — RAD ONC ARIA SESSION SUMMARY
Course Elapsed Days: 29
Plan Fractions Treated to Date: 22
Plan Prescribed Dose Per Fraction: 1.8 Gy
Plan Total Fractions Prescribed: 28
Plan Total Prescribed Dose: 50.4 Gy
Reference Point Dosage Given to Date: 39.6 Gy
Reference Point Session Dosage Given: 1.8 Gy
Session Number: 22

## 2023-03-09 ENCOUNTER — Ambulatory Visit
Admission: RE | Admit: 2023-03-09 | Discharge: 2023-03-09 | Disposition: A | Discharge status: Home / Self Care | Payer: Medicare Other | Source: Ambulatory Visit | Attending: Radiation Oncology | Admitting: Radiation Oncology

## 2023-03-09 ENCOUNTER — Other Ambulatory Visit: Payer: Self-pay

## 2023-03-09 DIAGNOSIS — Z510 Encounter for antineoplastic radiation therapy: Secondary | ICD-10-CM | POA: Diagnosis not present

## 2023-03-09 LAB — RAD ONC ARIA SESSION SUMMARY
Course Elapsed Days: 30
Plan Fractions Treated to Date: 23
Plan Prescribed Dose Per Fraction: 1.8 Gy
Plan Total Fractions Prescribed: 28
Plan Total Prescribed Dose: 50.4 Gy
Reference Point Dosage Given to Date: 41.4 Gy
Reference Point Session Dosage Given: 1.8 Gy
Session Number: 23

## 2023-03-10 ENCOUNTER — Other Ambulatory Visit: Payer: Self-pay

## 2023-03-10 ENCOUNTER — Inpatient Hospital Stay: Payer: Medicare Other

## 2023-03-10 ENCOUNTER — Telehealth: Payer: Self-pay

## 2023-03-10 ENCOUNTER — Other Ambulatory Visit: Payer: Self-pay | Admitting: Hematology

## 2023-03-10 ENCOUNTER — Ambulatory Visit
Admission: RE | Admit: 2023-03-10 | Discharge: 2023-03-10 | Disposition: A | Discharge status: Home / Self Care | Payer: Medicare Other | Source: Ambulatory Visit | Attending: Radiation Oncology | Admitting: Radiation Oncology

## 2023-03-10 ENCOUNTER — Ambulatory Visit
Admission: RE | Admit: 2023-03-10 | Discharge: 2023-03-10 | Discharge status: Home / Self Care | Payer: Medicare Other | Source: Ambulatory Visit | Attending: Radiation Oncology

## 2023-03-10 DIAGNOSIS — C210 Malignant neoplasm of anus, unspecified: Secondary | ICD-10-CM

## 2023-03-10 DIAGNOSIS — R300 Dysuria: Secondary | ICD-10-CM

## 2023-03-10 DIAGNOSIS — Z510 Encounter for antineoplastic radiation therapy: Secondary | ICD-10-CM | POA: Diagnosis not present

## 2023-03-10 LAB — URINALYSIS, COMPLETE (UACMP) WITH MICROSCOPIC
Bacteria, UA: NONE SEEN
Bilirubin Urine: NEGATIVE
Glucose, UA: NEGATIVE mg/dL
Hgb urine dipstick: NEGATIVE
Ketones, ur: NEGATIVE mg/dL
Leukocytes,Ua: NEGATIVE
Nitrite: NEGATIVE
Protein, ur: NEGATIVE mg/dL
Specific Gravity, Urine: 1.01 (ref 1.005–1.030)
pH: 5 (ref 5.0–8.0)

## 2023-03-10 LAB — RAD ONC ARIA SESSION SUMMARY
Course Elapsed Days: 31
Plan Fractions Treated to Date: 24
Plan Prescribed Dose Per Fraction: 1.8 Gy
Plan Total Fractions Prescribed: 28
Plan Total Prescribed Dose: 50.4 Gy
Reference Point Dosage Given to Date: 43.2 Gy
Reference Point Session Dosage Given: 1.8 Gy
Session Number: 24

## 2023-03-10 MED ORDER — HYDROCODONE-ACETAMINOPHEN 5-325 MG PO TABS: 1.0000 | ORAL_TABLET | Freq: Four times a day (QID) | ORAL | 0 refills | Status: DC | PRN

## 2023-03-10 NOTE — Addendum Note (Signed)
Encounter addended by: Edward Qualia on: 03/10/2023 10:28 AM  Actions taken: Imaging Exam ended

## 2023-03-10 NOTE — Addendum Note (Signed)
Encounter addended by: Edward Qualia on: 03/10/2023 10:23 AM  Actions taken: Imaging Exam ended

## 2023-03-10 NOTE — Addendum Note (Signed)
Encounter addended by: Edward Qualia on: 03/10/2023 10:22 AM  Actions taken: Imaging Exam ended

## 2023-03-10 NOTE — Telephone Encounter (Signed)
Patient stated she tried the Tylenol for pain as requested per Dr. Mosetta Putt she stated it is not working her pain is still an 8 out of 10 even after taking the Tylenol. She has requested something stronger. Made Dr. Mosetta Putt aware of her request.

## 2023-03-11 ENCOUNTER — Other Ambulatory Visit: Payer: Self-pay | Admitting: Family Medicine

## 2023-03-11 ENCOUNTER — Ambulatory Visit
Admission: RE | Admit: 2023-03-11 | Discharge: 2023-03-11 | Disposition: A | Discharge status: Home / Self Care | Payer: Medicare Other | Source: Ambulatory Visit | Attending: Radiation Oncology | Admitting: Radiation Oncology

## 2023-03-11 ENCOUNTER — Inpatient Hospital Stay: Payer: Medicare Other

## 2023-03-11 ENCOUNTER — Other Ambulatory Visit: Payer: Self-pay

## 2023-03-11 VITALS — BP 152/62 | HR 78 | Temp 98.0°F | Resp 18

## 2023-03-11 DIAGNOSIS — C210 Malignant neoplasm of anus, unspecified: Secondary | ICD-10-CM

## 2023-03-11 DIAGNOSIS — E7800 Pure hypercholesterolemia, unspecified: Secondary | ICD-10-CM

## 2023-03-11 DIAGNOSIS — Z510 Encounter for antineoplastic radiation therapy: Secondary | ICD-10-CM | POA: Diagnosis not present

## 2023-03-11 DIAGNOSIS — I700 Atherosclerosis of aorta: Secondary | ICD-10-CM

## 2023-03-11 LAB — RAD ONC ARIA SESSION SUMMARY
Course Elapsed Days: 32
Plan Fractions Treated to Date: 25
Plan Prescribed Dose Per Fraction: 1.8 Gy
Plan Total Fractions Prescribed: 28
Plan Total Prescribed Dose: 50.4 Gy
Reference Point Dosage Given to Date: 45 Gy
Reference Point Session Dosage Given: 1.8 Gy
Session Number: 25

## 2023-03-11 LAB — URINE CULTURE: Culture: NO GROWTH

## 2023-03-11 MED ORDER — HEPARIN SOD (PORK) LOCK FLUSH 100 UNIT/ML IV SOLN: 500.0000 [IU] | Freq: Once | INTRAVENOUS | Status: DC | PRN

## 2023-03-11 MED ORDER — SODIUM CHLORIDE 0.9% FLUSH: 10.0000 mL | INTRAVENOUS | Status: DC | PRN

## 2023-03-11 NOTE — Progress Notes (Signed)
Picc line and Chemotherapy D/Ced. Patient tolerated well with no issues. Patient laid flat for 30 minutes with dressing. No blood on dressing upon completion of wait period. VSS. Patient tolerated well with no issues. Education given about aftercare. Ambulatory to the lobby.

## 2023-03-11 NOTE — Patient Instructions (Signed)
 PICC Removal, Adult, Care After The following information offers guidance on how to care for yourself after your procedure. Your health care provider may also give you more specific instructions. If you have problems or questions, contact your health care provider. What can I expect after the procedure? After the procedure, it is common to have: Tenderness or soreness. Redness, swelling, or a scab at the place where your PICC was removed (exit site). Follow these instructions at home: For the first 24 hours after the procedure: Keep the bandage (dressing) on your exit site clean and dry. Do not remove your dressing until your health care provider tells you to do so. Do not lift anything heavy or do activities that require great effort until your health care provider says it is okay. You should avoid: Lifting weights. Doing yard work. Doing any physical activity with repetitive arm movement. Watch closely for any signs of an air bubble in the vein (air embolism). This is a rare but serious complication. Signs of an air embolism include trouble breathing, wheezing, chest pain, or a fast pulse. If you have signs of an air embolism, call 911 right away and lie down on your left side to keep the air from moving into your lungs. After 24 hours have passed:  Remove your dressing as told by your health care provider. Wash your hands with soap and water for at least 20 seconds before and after you change your dressing. If soap and water are not available, use hand sanitizer. Return to your normal activities as told by your health care provider. A small scab may develop over the exit site. Do not pick at the scab. When bathing or showering, gently wash the exit site with soap and water. Pat it dry. Watch for signs of infection, such as: A fever or chills. Swollen glands under your arm. More redness, swelling, or soreness around your arm. Blood, fluid, or pus coming from your exit site. Warmth or a  bad smell coming from your exit site. A red streak spreading away from your exit site. General instructions Take over-the-counter and prescription medicines only as told by your health care provider. Do not take any new medicines without checking with your health care provider first. If you were given an antibiotic ointment, apply it as told by your health care provider. Keep all follow-up visits. This is important. Contact a health care provider if: You have a fever or chills. You have swelling at your exit site or swollen glands under your arm. You have signs of infection at your exit site. You have soreness, redness, or swelling in your arm that gets worse. Get help right away if: You have numbness or tingling in your fingers, hand, or arm. Your arm looks blue and feels cold. You have signs of an air embolism, such as trouble breathing, wheezing, chest pain, or a fast pulse. These symptoms may be an emergency. Get medical help right away. Call 911. Do not wait to see if the symptoms will go away. Do not drive yourself to the hospital. Summary After a PICC is removed, it is common to have tenderness or soreness, redness, swelling, or a scab at the exit site. Keep the bandage (dressing) over the exit site clean and dry. Do not remove the dressing until your health care provider tells you to do so. Do not lift anything heavy or do activities that require great effort until your health care provider says it is okay. Watch closely for any signs  of an air bubble (air embolism). If you have signs of an air embolism, call 911 right away and lie down on your left side. This information is not intended to replace advice given to you by your health care provider. Make sure you discuss any questions you have with your health care provider. Document Revised: 07/23/2020 Document Reviewed: 07/23/2020 Elsevier Patient Education  2024 [employer].

## 2023-03-14 ENCOUNTER — Other Ambulatory Visit: Payer: Self-pay

## 2023-03-14 ENCOUNTER — Other Ambulatory Visit: Payer: Self-pay | Admitting: Hematology

## 2023-03-14 ENCOUNTER — Ambulatory Visit
Admission: RE | Admit: 2023-03-14 | Discharge: 2023-03-14 | Disposition: A | Discharge status: Home / Self Care | Payer: Medicare Other | Source: Ambulatory Visit | Attending: Radiation Oncology | Admitting: Radiation Oncology

## 2023-03-14 DIAGNOSIS — C210 Malignant neoplasm of anus, unspecified: Secondary | ICD-10-CM

## 2023-03-14 DIAGNOSIS — Z510 Encounter for antineoplastic radiation therapy: Secondary | ICD-10-CM | POA: Diagnosis not present

## 2023-03-14 LAB — RAD ONC ARIA SESSION SUMMARY
Course Elapsed Days: 35
Plan Fractions Treated to Date: 26
Plan Prescribed Dose Per Fraction: 1.8 Gy
Plan Total Fractions Prescribed: 28
Plan Total Prescribed Dose: 50.4 Gy
Reference Point Dosage Given to Date: 46.8 Gy
Reference Point Session Dosage Given: 1.8 Gy
Session Number: 26

## 2023-03-15 ENCOUNTER — Ambulatory Visit
Admission: RE | Admit: 2023-03-15 | Discharge: 2023-03-15 | Disposition: A | Discharge status: Home / Self Care | Payer: Medicare Other | Source: Ambulatory Visit | Attending: Radiation Oncology | Admitting: Radiation Oncology

## 2023-03-15 ENCOUNTER — Other Ambulatory Visit: Payer: Self-pay

## 2023-03-15 ENCOUNTER — Ambulatory Visit
Admission: RE | Admit: 2023-03-15 | Discharge: 2023-03-15 | Disposition: A | Discharge status: Home / Self Care | Payer: Medicare Other | Source: Ambulatory Visit | Attending: Radiation Oncology

## 2023-03-15 DIAGNOSIS — Z510 Encounter for antineoplastic radiation therapy: Secondary | ICD-10-CM | POA: Diagnosis not present

## 2023-03-15 LAB — RAD ONC ARIA SESSION SUMMARY
Course Elapsed Days: 36
Plan Fractions Treated to Date: 27
Plan Prescribed Dose Per Fraction: 1.8 Gy
Plan Total Fractions Prescribed: 28
Plan Total Prescribed Dose: 50.4 Gy
Reference Point Dosage Given to Date: 48.6 Gy
Reference Point Session Dosage Given: 1.8 Gy
Session Number: 27

## 2023-03-15 NOTE — Assessment & Plan Note (Signed)
 cT1N0M0 stage I -Discussed screening colonoscopy -Seen by provider secondary Maisie Fus, felt not to be a candidate for endoscopy resection. -Patient started concurrent chemoradiation on February 07, 2023 with mitomycin and 5-FU regimen, tolerating treatment well overall  -Completion mitomycin and 5-FU on 03/11/2023. -Final radiation 03/16/2023.

## 2023-03-15 NOTE — Progress Notes (Unsigned)
 Patient Care Team: Avanell Shackleton, NP-C as PCP - General (Family Medicine) Napoleon Form, MD as Consulting Physician (Gastroenterology) [facility] as Consulting Physician (Ophthalmology) Pollyann Samples, NP as Nurse Practitioner (Hematology and Oncology)  Clinic Day:  03/16/2023  Referring physician: Avanell Shackleton, NP-C  ASSESSMENT & PLAN:   Assessment & Plan: Anal cancer (HCC) cT1N0M0 stage I -Discussed screening colonoscopy -Seen by provider secondary Brittany Fus, felt not to be a candidate for endoscopy resection. -Patient started concurrent chemoradiation on February 07, 2023 with mitomycin and 5-FU regimen, tolerating treatment well overall  -Completion mitomycin and 5-FU on 03/11/2023. -Final radiation 03/16/2023.  Decreased appetite Having trouble tolerating oral foods.  Things not tasting very good. Consuming Ensure or boost protein shakes to help supplement calories and protein intake.  Pain related to radiation Last treatment of radiation today, 03/16/2023. Skin check per Dr. Mitzi Hansen and is without blisters or lesions.  Skin intact. Patient understands to keep skin clean and dry.  Will notify clinic if there is worsening of pain.  Diarrhea related to chemoradiation She continues to take OTC Imodium which does help. She denies blood in the stool.  Cytopenias related to chemoradiation WBC 2.4, Hgb 10.0, HCT 36.5, PLT 133 Recheck labs in 1 week and treat as indicated.  Plan: Patient's last dose radiation just prior to today's visit. She understands to keep her skin clean and dry, and avoid applying ointments or creams to the area. Will continue to use Imodium as needed for diarrhea. Labs reviewed.  Stable and mild anemia.  Mild leukopenia Mildly elevated liver functions.  Chemotherapy completed on Friday, 03/11/2023. Recheck labs and follow-up in 1 week.     The patient understands the plans discussed today and is in agreement with them.  She  knows to contact our office if she develops concerns prior to her next appointment.  I provided 25 minutes of face-to-face time during this encounter and > 50% was spent counseling as documented under my assessment and plan.    Carlean Jews, NP  [facility] - A DEPT [facility] [address] [place] [address] Dept: [phone] Dept Fax: [phone]   No orders of the defined types were placed in this encounter.     CHIEF COMPLAINT:  CC: Anal cancer  Current Treatment: Concurrent chemoradiation  INTERVAL HISTORY:  Brittany Summers is here today for repeat clinical assessment.  Recently finished chemotherapy mitomycin and 5-FU.  Final radiation was today, 03/16/2023.  Reports reduced appetite.  Many foods just not tasting well.  Does have mild, intermittent nausea.  Prescribed antiemetics are helpful.  Skin around rectal area is very irritated without blistering or lesions..  Does have some diarrhea and takes Imodium to help.  Denies presence of blood in her stool.  She does feel fatigued. She denies chest pain, chest pressure, or shortness of breath. She denies headaches or visual disturbances. She denies fevers or chills. Her weight has decreased 4 pounds over last week .  I have reviewed the past medical history, past surgical history, social history and family history with the patient and they are unchanged from previous note.  ALLERGIES:  is allergic to penicillins.  MEDICATIONS:  Current Outpatient Medications  Medication Sig Dispense Refill   atorvastatin (LIPITOR) 10 MG tablet TAKE 1 TABLET BY MOUTH DAILY. Please schedule office visit for further refills. 90 tablet 0   Cholecalciferol (VITAMIN D3) 50 MCG (2000  UT) TABS Take 2,000 Units by mouth in the morning and at bedtime.     HYDROcodone-acetaminophen (NORCO/VICODIN) 5-325 MG tablet Take 1 tablet by mouth every 6 (six) hours as needed for moderate pain  (pain score 4-6). 20 tablet 0   magic mouthwash (nystatin, lidocaine, diphenhydrAMINE, alum & mag hydroxide) suspension Swish 5 mLs to coat mouth and spit out 4 (four) times daily. 240 mL 0   ondansetron (ZOFRAN) 8 MG tablet Take 1 tablet (8 mg total) by mouth every 8 (eight) hours as needed for nausea or vomiting. 30 tablet 1   prochlorperazine (COMPAZINE) 10 MG tablet Take 1 tablet (10 mg total) by mouth every 6 (six) hours as needed for nausea or vomiting. 30 tablet 1   No current facility-administered medications for this visit.   Facility-Administered Medications Ordered in Other Visits  Medication Dose Route Frequency Provider Last Rate Last Admin   indocyanine green (IC-GREEN) injection 25 mg  25 mg Topical Once Almond Lint, MD        HISTORY OF PRESENT ILLNESS:   Oncology History  Anal cancer (HCC)  01/25/2023 Initial Diagnosis   Anal cancer (HCC)   02/07/2023 -  Chemotherapy   Patient is on Treatment Plan : ANUS Mitomycin D1,28 + 5FU D1-4, 28-31 q32d     02/07/2023 Cancer Staging   Staging form: Anus, AJCC V9 - Clinical: Stage I (cT1, cN0, cM0) - Signed by Pollyann Samples, NP on 02/07/2023 Stage prefix: Initial diagnosis       REVIEW OF SYSTEMS:   Constitutional: Denies fevers or chills.  Has had 4 pound weight loss due to decreased appetite. Eyes: Denies blurriness of vision Ears, nose, mouth, throat, and face: Denies mucositis or sore throat Respiratory: Denies cough, dyspnea or wheezes Cardiovascular: Denies palpitation, chest discomfort or lower extremity swelling Gastrointestinal: Has had mild, intermittent nausea.  Does have frequent diarrhea.  Using Imodium which is helpful. Skin: Denies abnormal skin rashes Lymphatics: Denies new lymphadenopathy or easy bruising Neurological:Denies numbness, tingling or new weaknesses Behavioral/Psych: Mood is stable, no new changes  All other systems were reviewed with the patient and are negative.   VITALS:   Today's  Vitals   03/16/23 1003  BP: 122/68  Pulse: 95  Resp: (!) 22  Temp: 98.2 F (36.8 C)  TempSrc: Temporal  SpO2: 100%  Weight: 127 lb 3.2 oz (57.7 kg)  PainSc: 6    Body mass index is 24.03 kg/m.   Wt Readings from Last 3 Encounters:  03/16/23 127 lb 3.2 oz (57.7 kg)  03/07/23 131 lb 11.2 oz (59.7 kg)  03/01/23 131 lb 3.2 oz (59.5 kg)    Body mass index is 24.03 kg/m.  Performance status (ECOG): 1 - Symptomatic but completely ambulatory  PHYSICAL EXAM:   GENERAL:alert, no distress.  Patient having difficult time finding comfortable seated position.  Ended up standing through most of today's clinic visit. SKIN: skin color, texture, turgor are normal, no rashes or significant lesions EYES: normal, Conjunctiva are pink and non-injected, sclera clear OROPHARYNX:no exudate, no erythema and lips, buccal mucosa, and tongue normal  NECK: supple, thyroid normal size, non-tender, without nodularity LYMPH:  no palpable lymphadenopathy in the cervical, axillary or inguinal LUNGS: clear to auscultation and percussion with normal breathing effort HEART: regular rate & rhythm and no murmurs and no lower extremity edema ABDOMEN:abdomen soft, non-tender and normal bowel sounds Musculoskeletal:no cyanosis of digits and no clubbing  NEURO: alert & oriented x 3 with fluent speech, no focal motor/sensory  deficits  LABORATORY DATA:  I have reviewed the data as listed    Component Value Date/Time   NA 136 03/16/2023 0932   NA 141 09/26/2019 1102   K 3.9 03/16/2023 0932   CL 100 03/16/2023 0932   CO2 29 03/16/2023 0932   GLUCOSE 105 (H) 03/16/2023 0932   BUN 13 03/16/2023 0932   BUN 9 09/26/2019 1102   CREATININE 0.50 03/16/2023 0932   CREATININE 0.40 (L) 03/07/2023 0950   CREATININE 0.45 (L) 09/22/2016 1350   CALCIUM 9.1 03/16/2023 0932   PROT 6.8 03/16/2023 0932   PROT 7.1 09/26/2019 1102   ALBUMIN 4.0 03/16/2023 0932   ALBUMIN 4.7 09/26/2019 1102   AST 29 03/16/2023 0932   AST 23  03/07/2023 0950   ALT 59 (H) 03/16/2023 0932   ALT 34 03/07/2023 0950   ALT 37 (H) 09/22/2016 1350   ALKPHOS 57 03/16/2023 0932   BILITOT 0.7 03/16/2023 0932   BILITOT 0.3 03/07/2023 0950   GFRNONAA >60 03/16/2023 0932   GFRNONAA >60 03/07/2023 0950   GFRNONAA 107 09/22/2016 1350   GFRAA 113 09/26/2019 1102   GFRAA 124 09/22/2016 1350     Lab Results  Component Value Date   WBC 2.4 (L) 03/16/2023   NEUTROABS 1.7 03/16/2023   HGB 10.0 (L) 03/16/2023   HCT 30.5 (L) 03/16/2023   MCV 92.1 03/16/2023   PLT 133 (L) 03/16/2023     RADIOGRAPHIC STUDIES: IR PICC PLACEMENT RIGHT >5 YRS INC IMG GUIDE Result Date: 03/07/2023 INDICATION: Patient with history of anal cancer; central venous access requested for chemotherapy EXAM: RIGHT UPPER EXTREMITY PICC LINE PLACEMENT WITH ULTRASOUND AND FLUOROSCOPIC GUIDANCE MEDICATIONS: 3 ml 1% lidocaine to skin and subcutaneous tissue ANESTHESIA/SEDATION: None FLUOROSCOPY: Radiation Exposure Index (as provided by the fluoroscopic device): 1 mGy Kerma COMPLICATIONS: None immediate. PROCEDURE: The patient was advised of the possible risks and complications and agreed to undergo the procedure. The patient was then brought to the angiographic suite for the procedure. The right arm was prepped with chlorhexidine, draped in the usual sterile fashion using maximum barrier technique (cap and mask, sterile gown, sterile gloves, large sterile sheet, hand hygiene and cutaneous antisepsis) and infiltrated locally with 1% Lidocaine. Ultrasound demonstrated patency of the right basilic vein, and this was documented with an image. Under real-time ultrasound guidance, this vein was accessed with a 21 gauge micropuncture needle and image documentation was performed. A 0.018 wire was introduced in to the vein. Over this, a 5 [place] dual lumen power injectable PICC was advanced to the lower SVC/right atrial junction. Fluoroscopy during the procedure and fluoro spot radiograph  confirms appropriate catheter position. The catheter was flushed and covered with a sterile dressing. Catheter length: 38 cm IMPRESSION: Successful right arm power PICC line placement with ultrasound and fluoroscopic guidance. The catheter is ready for use. Performed by: Artemio Aly Electronically Signed   By: Corlis Leak M.D.   On: 03/07/2023 09:28

## 2023-03-16 ENCOUNTER — Inpatient Hospital Stay (HOSPITAL_BASED_OUTPATIENT_CLINIC_OR_DEPARTMENT_OTHER): Payer: Medicare Other | Admitting: Nurse Practitioner

## 2023-03-16 ENCOUNTER — Encounter: Payer: Self-pay | Admitting: Nurse Practitioner

## 2023-03-16 ENCOUNTER — Inpatient Hospital Stay: Payer: Medicare Other

## 2023-03-16 ENCOUNTER — Ambulatory Visit
Admission: RE | Admit: 2023-03-16 | Discharge: 2023-03-16 | Disposition: A | Discharge status: Home / Self Care | Payer: Medicare Other | Source: Ambulatory Visit | Attending: Radiation Oncology | Admitting: Radiation Oncology

## 2023-03-16 ENCOUNTER — Other Ambulatory Visit: Payer: Self-pay

## 2023-03-16 VITALS — BP 122/68 | HR 95 | Temp 98.2°F | Resp 22 | Wt 127.2 lb

## 2023-03-16 DIAGNOSIS — C210 Malignant neoplasm of anus, unspecified: Secondary | ICD-10-CM | POA: Diagnosis not present

## 2023-03-16 DIAGNOSIS — Z510 Encounter for antineoplastic radiation therapy: Secondary | ICD-10-CM | POA: Diagnosis not present

## 2023-03-16 LAB — CBC WITH DIFFERENTIAL/PLATELET
Abs Immature Granulocytes: 0 10*3/uL (ref 0.00–0.07)
Basophils Absolute: 0 10*3/uL (ref 0.0–0.1)
Basophils Relative: 0 %
Eosinophils Absolute: 0.1 10*3/uL (ref 0.0–0.5)
Eosinophils Relative: 6 %
HCT: 30.5 % — ABNORMAL LOW (ref 36.0–46.0)
Hemoglobin: 10 g/dL — ABNORMAL LOW (ref 12.0–15.0)
Immature Granulocytes: 0 %
Lymphocytes Relative: 8 %
Lymphs Abs: 0.2 10*3/uL — ABNORMAL LOW (ref 0.7–4.0)
MCH: 30.2 pg (ref 26.0–34.0)
MCHC: 32.8 g/dL (ref 30.0–36.0)
MCV: 92.1 fL (ref 80.0–100.0)
Monocytes Absolute: 0.3 10*3/uL (ref 0.1–1.0)
Monocytes Relative: 14 %
Neutro Abs: 1.7 10*3/uL (ref 1.7–7.7)
Neutrophils Relative %: 72 %
Platelets: 133 10*3/uL — ABNORMAL LOW (ref 150–400)
RBC: 3.31 MIL/uL — ABNORMAL LOW (ref 3.87–5.11)
RDW: 14.3 % (ref 11.5–15.5)
WBC: 2.4 10*3/uL — ABNORMAL LOW (ref 4.0–10.5)
nRBC: 0 % (ref 0.0–0.2)

## 2023-03-16 LAB — RAD ONC ARIA SESSION SUMMARY
Course Elapsed Days: 37
Plan Fractions Treated to Date: 28
Plan Prescribed Dose Per Fraction: 1.8 Gy
Plan Total Fractions Prescribed: 28
Plan Total Prescribed Dose: 50.4 Gy
Reference Point Dosage Given to Date: 50.4 Gy
Reference Point Session Dosage Given: 1.8 Gy
Session Number: 28

## 2023-03-16 LAB — COMPREHENSIVE METABOLIC PANEL
ALT: 59 U/L — ABNORMAL HIGH (ref 0–44)
AST: 29 U/L (ref 15–41)
Albumin: 4 g/dL (ref 3.5–5.0)
Alkaline Phosphatase: 57 U/L (ref 38–126)
Anion gap: 7 (ref 5–15)
BUN: 13 mg/dL (ref 8–23)
CO2: 29 mmol/L (ref 22–32)
Calcium: 9.1 mg/dL (ref 8.9–10.3)
Chloride: 100 mmol/L (ref 98–111)
Creatinine, Ser: 0.5 mg/dL (ref 0.44–1.00)
GFR, Estimated: >60 mL/min (ref >60)
Glucose, Bld: 105 mg/dL — ABNORMAL HIGH (ref 70–99)
Potassium: 3.9 mmol/L (ref 3.5–5.1)
Sodium: 136 mmol/L (ref 135–145)
Total Bilirubin: 0.7 mg/dL (ref 0.0–1.2)
Total Protein: 6.8 g/dL (ref 6.5–8.1)

## 2023-03-17 NOTE — Radiation Completion Notes (Addendum)
  Radiation Oncology         [phone] ________________________________  Name: Brittany Summers MRN: [id number]  Date of Service: 03/16/2023  DOB: August 02, 1955  End of Treatment Note   Diagnosis:   Stage I, cT1N0M0, Squamous cell carcinoma of the anus   Intent: Curative     ==========DELIVERED PLANS==========  First Treatment Date: 2023-02-07 Last Treatment Date: 2023-03-16   Plan Name: Anus Site: Anus Technique: IMRT Mode: Photon Dose Per Fraction: 1.8 Gy Prescribed Dose (Delivered / Prescribed): 50.4 Gy / 50.4 Gy Prescribed Fxs (Delivered / Prescribed): 28 / 28     ==========ON TREATMENT VISIT DATES========== 2023-02-11, 2023-02-18, 2023-02-25, 2023-03-03, 2023-03-10, 2023-03-15    See weekly On Treatment Notes in Epic for details in the Media tab (listed as Progress notes on the On Treatment Visit Dates listed above). The patient tolerated radiation. She developed fatigue and anticipated skin changes in the treatment field. She developed some nausea and loose stool during therapy.  The patient will receive a call in about one month from the radiation oncology department. She will continue follow up with Dr. Mosetta Putt as well as Dr. Maisie Fus in surveillance.      Osker Mason, PAC

## 2023-03-21 ENCOUNTER — Encounter: Payer: Self-pay | Admitting: Hematology

## 2023-03-21 NOTE — Assessment & Plan Note (Signed)
 cT1N0M0 stage I -Discussed screening colonoscopy -Seen by provider secondary Maisie Fus, felt not to be a candidate for endoscopy resection. -Patient started concurrent chemoradiation on February 07, 2023 with mitomycin and 5-FU regimen, tolerated the treatment well overall, she completed chemoradiation on March 16, 2023.

## 2023-03-22 ENCOUNTER — Encounter: Payer: Self-pay | Admitting: Hematology

## 2023-03-22 ENCOUNTER — Inpatient Hospital Stay: Payer: Medicare Other | Attending: Hematology

## 2023-03-22 ENCOUNTER — Inpatient Hospital Stay (HOSPITAL_BASED_OUTPATIENT_CLINIC_OR_DEPARTMENT_OTHER): Payer: Medicare Other | Admitting: Hematology

## 2023-03-22 VITALS — BP 116/58 | HR 92 | Temp 98.0°F | Resp 17 | Wt 123.1 lb

## 2023-03-22 DIAGNOSIS — Z79899 Other long term (current) drug therapy: Secondary | ICD-10-CM | POA: Insufficient documentation

## 2023-03-22 DIAGNOSIS — T451X5A Adverse effect of antineoplastic and immunosuppressive drugs, initial encounter: Secondary | ICD-10-CM | POA: Diagnosis not present

## 2023-03-22 DIAGNOSIS — Z923 Personal history of irradiation: Secondary | ICD-10-CM | POA: Diagnosis not present

## 2023-03-22 DIAGNOSIS — K1379 Other lesions of oral mucosa: Secondary | ICD-10-CM | POA: Diagnosis not present

## 2023-03-22 DIAGNOSIS — I10 Essential (primary) hypertension: Secondary | ICD-10-CM | POA: Diagnosis not present

## 2023-03-22 DIAGNOSIS — C210 Malignant neoplasm of anus, unspecified: Secondary | ICD-10-CM | POA: Insufficient documentation

## 2023-03-22 DIAGNOSIS — Z9221 Personal history of antineoplastic chemotherapy: Secondary | ICD-10-CM | POA: Insufficient documentation

## 2023-03-22 DIAGNOSIS — D61810 Antineoplastic chemotherapy induced pancytopenia: Secondary | ICD-10-CM | POA: Insufficient documentation

## 2023-03-22 DIAGNOSIS — B182 Chronic viral hepatitis C: Secondary | ICD-10-CM | POA: Insufficient documentation

## 2023-03-22 LAB — CBC WITH DIFFERENTIAL (CANCER CENTER ONLY)
Abs Immature Granulocytes: 0.03 10*3/uL (ref 0.00–0.07)
Basophils Absolute: 0 10*3/uL (ref 0.0–0.1)
Basophils Relative: 0 %
Eosinophils Absolute: 0 10*3/uL (ref 0.0–0.5)
Eosinophils Relative: 1 %
HCT: 29.6 % — ABNORMAL LOW (ref 36.0–46.0)
Hemoglobin: 10 g/dL — ABNORMAL LOW (ref 12.0–15.0)
Immature Granulocytes: 1 %
Lymphocytes Relative: 9 %
Lymphs Abs: 0.3 10*3/uL — ABNORMAL LOW (ref 0.7–4.0)
MCH: 31.2 pg (ref 26.0–34.0)
MCHC: 33.8 g/dL (ref 30.0–36.0)
MCV: 92.2 fL (ref 80.0–100.0)
Monocytes Absolute: 1.2 10*3/uL — ABNORMAL HIGH (ref 0.1–1.0)
Monocytes Relative: 40 %
Neutro Abs: 1.5 10*3/uL — ABNORMAL LOW (ref 1.7–7.7)
Neutrophils Relative %: 49 %
Platelet Count: 63 10*3/uL — ABNORMAL LOW (ref 150–400)
RBC: 3.21 MIL/uL — ABNORMAL LOW (ref 3.87–5.11)
RDW: 14.6 % (ref 11.5–15.5)
WBC Count: 3.1 10*3/uL — ABNORMAL LOW (ref 4.0–10.5)
nRBC: 0 % (ref 0.0–0.2)

## 2023-03-22 LAB — CMP (CANCER CENTER ONLY)
ALT: 25 U/L (ref 0–44)
AST: 20 U/L (ref 15–41)
Albumin: 3.5 g/dL (ref 3.5–5.0)
Alkaline Phosphatase: 50 U/L (ref 38–126)
Anion gap: 7 (ref 5–15)
BUN: 7 mg/dL — ABNORMAL LOW (ref 8–23)
CO2: 34 mmol/L — ABNORMAL HIGH (ref 22–32)
Calcium: 8.9 mg/dL (ref 8.9–10.3)
Chloride: 95 mmol/L — ABNORMAL LOW (ref 98–111)
Creatinine: 0.45 mg/dL (ref 0.44–1.00)
GFR, Estimated: >60 mL/min (ref >60)
Glucose, Bld: 115 mg/dL — ABNORMAL HIGH (ref 70–99)
Potassium: 3 mmol/L — ABNORMAL LOW (ref 3.5–5.1)
Sodium: 136 mmol/L (ref 135–145)
Total Bilirubin: 0.5 mg/dL (ref 0.0–1.2)
Total Protein: 6.4 g/dL — ABNORMAL LOW (ref 6.5–8.1)

## 2023-03-22 MED ORDER — DIPHENOXYLATE-ATROPINE 2.5-0.025 MG PO TABS
1.0000 | ORAL_TABLET | Freq: Four times a day (QID) | ORAL | 1 refills | Status: DC | PRN
Start: 2023-03-22 — End: 2023-10-12

## 2023-03-22 NOTE — Progress Notes (Signed)
 [facility]   [phone] [phone]   Clinic Follow up Note   Brittany Summers Care Team: Avanell Shackleton, NP-C as PCP - General (Family Medicine) Napoleon Form, MD as Consulting Physician (Gastroenterology) [facility] as Consulting Physician (Ophthalmology) Pollyann Samples, NP as Nurse Practitioner (Hematology and Oncology)  Date of Service:  03/22/2023  CHIEF COMPLAINT: f/u of anal cancer  CURRENT THERAPY:  Surveillance  Oncology History   Anal cancer (HCC) cT1N0M0 stage I -Discussed screening colonoscopy -Seen by provider secondary Maisie Fus, felt not to be a candidate for endoscopy resection. -Brittany Summers started concurrent chemoradiation on February 07, 2023 with mitomycin and 5-FU regimen, tolerated the treatment well overall, she completed chemoradiation on March 16, 2023.    Assessment and Plan    Anal Cancer 68 year old female undergoing concurrent chemoradiation for anal cancer. Completed second round of chemotherapy last week. Experiencing significant diarrhea (up to four episodes in the morning), skin breakdown and bleeding around the anus, and dryness and peeling in the groin area. Blood counts are dropping as expected, with a platelet count of 63,000. No need for blood transfusion at this time. Discussed Lomotil for diarrhea management, including potential side effects such as dry mouth and increased heart rate. Radiation continues to work for up to three months post-treatment. PET scan scheduled for the end of May to assess treatment response. Emphasized the importance of good nutrition and exercise to aid recovery. - Prescribe Lomotil, 1-2 tablets up to four times a day, for diarrhea management. - Use a squeeze bottle with water for cleaning after bowel movements. - Keep the area dry using a hair dryer on a cool setting. - Continue Tylenol for pain management; hydrocodone available if needed. - Monitor for signs of  severe thrombocytopenia (petechiae, gum bleeding, epistaxis) and advise to avoid falls and cuts. - Order PET scan for the end of May to assess treatment response. - Schedule follow-up appointment in June to review PET scan results. - Monitor blood counts in two weeks. - Encourage good nutrition and exercise.  Chemotherapy-Induced Thrombocytopenia Platelet count is 63,000, which is low but not requiring transfusion. Advised to monitor for signs of bleeding and avoid activities that could cause injury. - Monitor for signs of bleeding such as petechiae, gum bleeding, and epistaxis. - Advise to avoid falls and cuts.      PLAN -Lab reviewed, pancytopenic from chemotherapy, will repeat lab in 2 weeks -Follow-up in 3 months with lab and lab PET scan 1 week before to evaluate her response to chemoradiation   SUMMARY OF ONCOLOGIC HISTORY: Oncology History  Anal cancer (HCC)  01/25/2023 Initial Diagnosis   Anal cancer (HCC)   02/07/2023 -  Chemotherapy   Brittany Summers is on Treatment Plan : ANUS Mitomycin D1,28 + 5FU D1-4, 28-31 q32d     02/07/2023 Cancer Staging   Staging form: Anus, AJCC V9 - Clinical: Stage I (cT1, cN0, cM0) - Signed by Pollyann Samples, NP on 02/07/2023 Stage prefix: Initial diagnosis      Discussed the use of AI scribe software for clinical note transcription with the Brittany Summers, who gave verbal consent to proceed.  History of Present Illness   A 68 year old Brittany Summers with a history of anal cancer, currently on concurrent chemo-radiation therapy, presents for a follow-up visit. The Brittany Summers reports persistent diarrhea despite taking Imodium, with up to four bowel movements in the morning. The Brittany Summers also reports fatigue but notes a recent improvement in mental clarity. The Brittany Summers experienced mouth  sores after the first round of chemotherapy, but these did not recur after the second round. The Brittany Summers also reports discomfort and occasional bleeding in the anal area, with some skin  breakdown in the groin area. The Brittany Summers has been managing the discomfort with Tylenol and occasionally hydrocodone.         All other systems were reviewed with the Brittany Summers and are negative.  MEDICAL HISTORY:  Past Medical History:  Diagnosis Date   Chronic hepatitis C without hepatic coma (HCC) 09/22/2016   finished treatment for this   Gallstone    known with h/o cholecystitis pain, no recent recurrence   Hypertension    no meds taken    SURGICAL HISTORY: Past Surgical History:  Procedure Laterality Date   BREAST BIOPSY Left 2017   CHOLECYSTECTOMY N/A 08/04/2022   Procedure: LAPAROSCOPIC CHOLECYSTECTOMY;  Surgeon: Almond Lint, MD;  Location: [facility] OR;  Service: General;  Laterality: N/A;   COLONOSCOPY     MAXILLARY SINUS LIFT     MOUTH SURGERY     ORIF ANKLE FRACTURE Left 07/06/2018   Procedure: OPEN REDUCTION INTERNAL FIXATION (ORIF) left ankle trimalleolar fracture;  Surgeon: Toni Arthurs, MD;  Location: [facility];  Service: Orthopedics;  Laterality: Left;    TONSILLECTOMY     age 64   UPPER GASTROINTESTINAL ENDOSCOPY      I have reviewed the social history and family history with the Brittany Summers and they are unchanged from previous note.  ALLERGIES:  is allergic to penicillins.  MEDICATIONS:  Current Outpatient Medications  Medication Sig Dispense Refill   atorvastatin (LIPITOR) 10 MG tablet TAKE 1 TABLET BY MOUTH DAILY. Please schedule office visit for further refills. 90 tablet 0   Cholecalciferol (VITAMIN D3) 50 MCG (2000 UT) TABS Take 2,000 Units by mouth in the morning and at bedtime.     diphenoxylate-atropine (LOMOTIL) 2.5-0.025 MG tablet Take 1-2 tablets by mouth 4 (four) times daily as needed for diarrhea or loose stools. 60 tablet 1   HYDROcodone-acetaminophen (NORCO/VICODIN) 5-325 MG tablet Take 1 tablet by mouth every 6 (six) hours as needed for moderate pain (pain score 4-6). 20 tablet 0   magic mouthwash (nystatin, lidocaine,  diphenhydrAMINE, alum & mag hydroxide) suspension Swish 5 mLs to coat mouth and spit out 4 (four) times daily. 240 mL 0   ondansetron (ZOFRAN) 8 MG tablet Take 1 tablet (8 mg total) by mouth every 8 (eight) hours as needed for nausea or vomiting. 30 tablet 1   prochlorperazine (COMPAZINE) 10 MG tablet Take 1 tablet (10 mg total) by mouth every 6 (six) hours as needed for nausea or vomiting. 30 tablet 1   No current facility-administered medications for this visit.   Facility-Administered Medications Ordered in Other Visits  Medication Dose Route Frequency Provider Last Rate Last Admin   indocyanine green (IC-GREEN) injection 25 mg  25 mg Topical Once Almond Lint, MD        PHYSICAL EXAMINATION: ECOG PERFORMANCE STATUS: 2 - Symptomatic, <50% confined to bed  Vitals:   03/22/23 0925  BP: (!) 116/58  Pulse: 92  Resp: 17  Temp: 98 F (36.7 C)  SpO2: 99%   Wt Readings from Last 3 Encounters:  03/22/23 123 lb 1.6 oz (55.8 kg)  03/16/23 127 lb 3.2 oz (57.7 kg)  03/07/23 131 lb 11.2 oz (59.7 kg)     GENERAL:alert, no distress and comfortable SKIN: skin color, texture, turgor are normal, no rashes or significant lesions EYES: normal, Conjunctiva are  pink and non-injected, sclera clear NECK: supple, thyroid normal size, non-tender, without nodularity LYMPH:  no palpable lymphadenopathy in the cervical, axillary  LUNGS: clear to auscultation and percussion with normal breathing effort HEART: regular rate & rhythm and no murmurs and no lower extremity edema ABDOMEN:abdomen soft, non-tender and normal bowel sounds Musculoskeletal:no cyanosis of digits and no clubbing  NEURO: alert & oriented x 3 with fluent speech, no focal motor/sensory deficits      LABORATORY DATA:  I have reviewed the data as listed    Latest Ref Rng & Units 03/22/2023    9:00 AM 03/16/2023    9:32 AM 03/07/2023    9:50 AM  CBC  WBC 4.0 - 10.5 K/uL 3.1  2.4  4.3   Hemoglobin 12.0 - 15.0 g/dL 66.4  40.3  9.9    Hematocrit 36.0 - 46.0 % 29.6  30.5  30.1   Platelets 150 - 400 K/uL 63  133  153         Latest Ref Rng & Units 03/22/2023    9:00 AM 03/16/2023    9:32 AM 03/07/2023    9:50 AM  CMP  Glucose 70 - 99 mg/dL 474  259  563   BUN 8 - 23 mg/dL 7  13  13    Creatinine 0.44 - 1.00 mg/dL 8.75  6.43  3.29   Sodium 135 - 145 mmol/L 136  136  136   Potassium 3.5 - 5.1 mmol/L 3.0  3.9  3.6   Chloride 98 - 111 mmol/L 95  100  103   CO2 22 - 32 mmol/L 34  29  25   Calcium 8.9 - 10.3 mg/dL 8.9  9.1  8.9   Total Protein 6.5 - 8.1 g/dL 6.4  6.8  6.6   Total Bilirubin 0.0 - 1.2 mg/dL 0.5  0.7  0.3   Alkaline Phos 38 - 126 U/L 50  57  37   AST 15 - 41 U/L 20  29  23    ALT 0 - 44 U/L 25  59  34       RADIOGRAPHIC STUDIES: I have personally reviewed the radiological images as listed and agreed with the findings in the report. No results found.    Orders Placed This Encounter  Procedures   NM PET Image Restag (PS) Skull Base To Thigh    Standing Status:   Future    Expected Date:   06/15/2023    Expiration Date:   03/21/2024    If indicated for the ordered procedure, I authorize the administration of a radiopharmaceutical per Radiology protocol:   Yes    Preferred imaging location?:   [facility]   All questions were answered. The Brittany Summers knows to call the clinic with any problems, questions or concerns. No barriers to learning was detected. The total time spent in the appointment was 25 minutes.     Malachy Mood, MD 03/22/2023

## 2023-03-23 ENCOUNTER — Other Ambulatory Visit: Payer: Self-pay

## 2023-03-25 ENCOUNTER — Other Ambulatory Visit: Payer: Self-pay | Admitting: Hematology

## 2023-03-25 ENCOUNTER — Encounter: Payer: Self-pay | Admitting: Hematology

## 2023-03-25 MED ORDER — POTASSIUM CHLORIDE CRYS ER 20 MEQ PO TBCR: 20.0000 meq | EXTENDED_RELEASE_TABLET | Freq: Two times a day (BID) | ORAL | 0 refills | Status: DC

## 2023-03-27 ENCOUNTER — Other Ambulatory Visit: Payer: Self-pay | Admitting: Hematology

## 2023-03-28 NOTE — Telephone Encounter (Signed)
 Refilled by Dr. Mosetta Putt on 03/25/2023.  See MyChart message to pt from Dr. Mosetta Putt

## 2023-04-05 ENCOUNTER — Encounter: Payer: Self-pay | Admitting: Nurse Practitioner

## 2023-04-05 ENCOUNTER — Inpatient Hospital Stay

## 2023-04-05 DIAGNOSIS — C210 Malignant neoplasm of anus, unspecified: Secondary | ICD-10-CM

## 2023-04-05 LAB — CMP (CANCER CENTER ONLY)
ALT: 18 U/L (ref 0–44)
AST: 15 U/L (ref 15–41)
Albumin: 3.8 g/dL (ref 3.5–5.0)
Alkaline Phosphatase: 48 U/L (ref 38–126)
Anion gap: 6 (ref 5–15)
BUN: 12 mg/dL (ref 8–23)
CO2: 29 mmol/L (ref 22–32)
Calcium: 9.3 mg/dL (ref 8.9–10.3)
Chloride: 105 mmol/L (ref 98–111)
Creatinine: 0.44 mg/dL (ref 0.44–1.00)
GFR, Estimated: >60 mL/min (ref >60)
Glucose, Bld: 104 mg/dL — ABNORMAL HIGH (ref 70–99)
Potassium: 4 mmol/L (ref 3.5–5.1)
Sodium: 140 mmol/L (ref 135–145)
Total Bilirubin: 0.6 mg/dL (ref 0.0–1.2)
Total Protein: 6.5 g/dL (ref 6.5–8.1)

## 2023-04-05 LAB — CBC WITH DIFFERENTIAL (CANCER CENTER ONLY)
Abs Immature Granulocytes: 0.02 10*3/uL (ref 0.00–0.07)
Basophils Absolute: 0 10*3/uL (ref 0.0–0.1)
Basophils Relative: 0 %
Eosinophils Absolute: 0.1 10*3/uL (ref 0.0–0.5)
Eosinophils Relative: 2 %
HCT: 28.4 % — ABNORMAL LOW (ref 36.0–46.0)
Hemoglobin: 9.5 g/dL — ABNORMAL LOW (ref 12.0–15.0)
Immature Granulocytes: 0 %
Lymphocytes Relative: 13 %
Lymphs Abs: 0.6 10*3/uL — ABNORMAL LOW (ref 0.7–4.0)
MCH: 31.8 pg (ref 26.0–34.0)
MCHC: 33.5 g/dL (ref 30.0–36.0)
MCV: 95 fL (ref 80.0–100.0)
Monocytes Absolute: 0.6 10*3/uL (ref 0.1–1.0)
Monocytes Relative: 14 %
Neutro Abs: 3.2 10*3/uL (ref 1.7–7.7)
Neutrophils Relative %: 71 %
Platelet Count: 92 10*3/uL — ABNORMAL LOW (ref 150–400)
RBC: 2.99 MIL/uL — ABNORMAL LOW (ref 3.87–5.11)
RDW: 18 % — ABNORMAL HIGH (ref 11.5–15.5)
WBC Count: 4.5 10*3/uL (ref 4.0–10.5)
nRBC: 0 % (ref 0.0–0.2)

## 2023-04-11 NOTE — Progress Notes (Addendum)
  Radiation Oncology         [phone] ________________________________  Name: Brittany Summers MRN: [id number]  Date of Service:04/11/2023 DOB: April 29, 1955  Post Treatment Telephone Note  Diagnosis:  Stage I, cT1N0M0, Squamous cell carcinoma of the anus (as documented in provider EOT note)  The patient was available for call today.   Symptoms of fatigue have improved since completing therapy.  Symptoms of skin changes have improved since completing therapy.   Symptoms of bladder changes have improved since completing therapy.  Symptoms of bowel changes have not improved since completing therapy. Patient is still experiencing some moderate constipation/ diarrhea w/ anal mucus, but is overall doing okay.  The patient has scheduled follow up with her medical oncologist Dr. Mosetta Putt on 06/22/2023 and their surgeon Dr. Maisie Fus on 05/02/23 for ongoing surveillance. She was encouraged to call if she develops concerns or questions regarding radiation.   This concludes the interaction.  Ruel Favors, LPN

## 2023-04-18 ENCOUNTER — Ambulatory Visit
Admission: RE | Admit: 2023-04-18 | Discharge: 2023-04-18 | Disposition: A | Discharge status: Home / Self Care | Payer: Medicare Other | Source: Ambulatory Visit | Attending: Hematology | Admitting: Hematology

## 2023-04-19 ENCOUNTER — Ambulatory Visit: Payer: Medicare Other | Attending: Nurse Practitioner | Admitting: Physical Therapy

## 2023-04-19 ENCOUNTER — Encounter: Payer: Self-pay | Admitting: Physical Therapy

## 2023-04-19 DIAGNOSIS — R279 Unspecified lack of coordination: Secondary | ICD-10-CM | POA: Diagnosis present

## 2023-04-19 DIAGNOSIS — M6281 Muscle weakness (generalized): Secondary | ICD-10-CM | POA: Diagnosis present

## 2023-04-19 DIAGNOSIS — R293 Abnormal posture: Secondary | ICD-10-CM | POA: Insufficient documentation

## 2023-04-19 NOTE — Therapy (Signed)
 OUTPATIENT PHYSICAL THERAPY FEMALE PELVIC TREATMENT   Patient Name: Brittany Summers MRN: [id number] DOB:Mar 23, 1955, 68 y.o., female Today's Date: 04/19/2023  END OF SESSION:  PT End of Session - 04/19/23 1148     Visit Number 2    Date for PT Re-Evaluation 08/03/23    Authorization Type MCR/medicaid    PT Start Time 1145    PT Stop Time 1219    PT Time Calculation (min) 34 min    Activity Tolerance Patient tolerated treatment well    Behavior During Therapy [facility] for tasks assessed/performed              Past Medical History:  Diagnosis Date   Chronic hepatitis C without hepatic coma (HCC) 09/22/2016   finished treatment for this   Gallstone    known with h/o cholecystitis pain, no recent recurrence   Hypertension    no meds taken   Past Surgical History:  Procedure Laterality Date   BREAST BIOPSY Left 2017   CHOLECYSTECTOMY N/A 08/04/2022   Procedure: LAPAROSCOPIC CHOLECYSTECTOMY;  Surgeon: Almond Lint, MD;  Location: [facility] OR;  Service: General;  Laterality: N/A;   COLONOSCOPY     MAXILLARY SINUS LIFT     MOUTH SURGERY     ORIF ANKLE FRACTURE Left 07/06/2018   Procedure: OPEN REDUCTION INTERNAL FIXATION (ORIF) left ankle trimalleolar fracture;  Surgeon: Toni Arthurs, MD;  Location: [facility];  Service: Orthopedics;  Laterality: Left;    TONSILLECTOMY     age 69   UPPER GASTROINTESTINAL ENDOSCOPY     Patient Active Problem List   Diagnosis Date Noted   Port-A-Cath in place 02/07/2023   Anal cancer (HCC) 01/25/2023   Stopped smoking with greater than 20 pack year history 05/06/2022   Pulmonary emphysema (HCC) 05/06/2022   Aortic atherosclerosis (HCC) 03/18/2022   Pure hypercholesterolemia 03/18/2022   Vitamin D deficiency 03/18/2022   Osteopenia after menopause 07/18/2017   Hypertension 06/21/2016    PCP: Hetty Blend, NP-C  REFERRING PROVIDER: Pollyann Samples, NP  REFERRING DIAG: C21.0 (ICD-10-CM) - Anal cancer (HCC)  THERAPY  DIAG:  Muscle weakness (generalized)  Abnormal posture  Unspecified lack of coordination  Rationale for Evaluation and Treatment: Rehabilitation  ONSET DATE: 12/2022  SUBJECTIVE:                                                                                                                                                                                           SUBJECTIVE STATEMENT: Did have diarrhea the final few weeks of treatment, constant and uncontrollable. However now 5 weeks post treatment, no longer having leakage of stool or  diarrhea. Pt states now she does still has at least 4 bowel movements in the morning. Reports usually one larger stool and then a couple smaller. Usually around type 4.  Has been doing vaginal dilators small plus now.  No bladder symptoms.    Fluid intake: Yes: water - trying to increase water to 64 oz; coffee in AM, usually one glass of diet soda a day but has cut this out.     PAIN:  Are you having pain? No   PRECAUTIONS: Other: undergoing CA treatments starting next week    RED FLAGS: None   WEIGHT BEARING RESTRICTIONS: No  FALLS:  Has patient fallen in last 6 months? No  LIVING ENVIRONMENT: Lives with: lives with their spouse Lives in: House/apartment Stairs: Yes: Internal: 15 steps; can reach both Has following equipment at home: None  OCCUPATION: retired   PLOF: Independent  PATIENT GOALS: to stay active and strong  PERTINENT HISTORY:  Osteopenia, HTN, Pulmonary emphysema Sexual abuse: No  BOWEL MOVEMENT: Pain with bowel movement: No Type of bowel movement:Type (Bristol Stool Scale) 4, Frequency daily, and Strain No Fully empty rectum: Yes:   Leakage: No Pads: No Fiber supplement: No  URINATION: Pain with urination: No Fully empty bladder: Yes:   Stream: Strong Urgency: No Frequency: not quicker than every 2 hours, not always up at night sometimes once Leakage: Sneezing and vomiting  Pads: No  INTERCOURSE: Pain  with intercourse:  not active, not painful Ability to have vaginal penetration:  Yes:   Climax: not painful Marinoff Scale: 0/3  PREGNANCY: Vaginal deliveries 0 Tearing No C-section deliveries 0 Currently pregnant No  PROLAPSE: None   OBJECTIVE:  Note: Objective measures were completed at Evaluation unless otherwise noted.  DIAGNOSTIC FINDINGS:    COGNITION: Overall cognitive status: Within functional limits for tasks assessed     SENSATION: Light touch: Appears intact Proprioception: Appears intact  MUSCLE LENGTH: Bil hamstrings limited by 25%   FUNCTIONAL TESTS:  5 times sit to stand: 10.9s no AD Five times Sit to Stand Test (FTSS) Method: Use a straight back chair with a solid seat that is 16-18" high. Ask participant to sit on the chair with arms folded across their chest.   Instructions: "Stand up and sit down as quickly as possible 5 times, keeping your arms folded across your chest."   Measurement: Stop timing when the participant stands the 5th time.  TIME: ______ (in seconds)  Times > 13.6 seconds is associated with increased disability and morbidity (Guralnik, 2000) Times > 15 seconds is predictive of recurrent falls in healthy individuals aged 21 and older (Buatois, et al., 2008) Normal performance values in community dwelling individuals aged 6 and older (Bohannon, 2006): 60-69 years: 11.4 seconds 70-79 years: 12.6 seconds 80-89 years: 14.8 seconds  MCID: >= 2.3 seconds for Vestibular Disorders (Meretta, 2006)   GAIT: WFL no AD  POSTURE: rounded shoulders  PELVIC ALIGNMENT: WFL  LUMBARAROM/PROM:  A/PROM A/PROM  eval  Flexion WFL  Extension WFL  Right lateral flexion WFL  Left lateral flexion WFL  Right rotation Limited by 25%  Left rotation Limited by 25%   (Blank rows = not tested)  LOWER EXTREMITY ROM:  WFL  LOWER EXTREMITY MMT:  Bil hips grossly 4+/5; knees 5/5  PALPATION:   General  no TTP                External  Perineal Exam mild redness at vulva and labias  Internal Pelvic Floor no TTP  Patient confirms identification and approves PT to assess internal pelvic floor and treatment Yes No emotional/communication barriers or cognitive limitation. Patient is motivated to learn. Patient understands and agrees with treatment goals and plan. PT explains patient will be examined in standing, sitting, and lying down to see how their muscles and joints work. When they are ready, they will be asked to remove their underwear so PT can examine their perineum. The patient is also given the option of providing their own chaperone as one is not provided in our facility. The patient also has the right and is explained the right to defer or refuse any part of the evaluation or treatment including the internal exam. With the patient's consent, PT will use one gloved finger to gently assess the muscles of the pelvic floor, seeing how well it contracts and relaxes and if there is muscle symmetry. After, the patient will get dressed and PT and patient will discuss exam findings and plan of care. PT and patient discuss plan of care, schedule, attendance policy and HEP activities.  PELVIC MMT:   MMT eval  Vaginal 2/5 improved  to 3/5 with cues for techniques, 6s, 6 reps  Internal Anal Sphincter   External Anal Sphincter   Puborectalis   Diastasis Recti   (Blank rows = not tested)        TONE: Decreased   PROLAPSE: Mild anterior vaginal wall laxity with cough noted in hooklying   TODAY'S TREATMENT:                                                                                                                              DATE:   02/03/23 EVAL Examination completed, findings reviewed, pt educated on POC, HEP, energy conservation, walking program, vaginal dilators. Pt motivated to participate in PT and agreeable to attempt recommendations.     04/19/23  5xSTS completed pt scored 8.9s - within  age norms, no AD or LOB noted Pt educated on progression of HEP, vaginal dilators and walking program within energy conservations.  Voiding mechanics and abdominal massage for bowel habits  PATIENT EDUCATION:  Education details: [id number] Person educated: Patient Education method: Explanation, Demonstration, Tactile cues, Verbal cues, and Handouts Education comprehension: verbalized understanding and returned demonstration  HOME EXERCISE PROGRAM: [email or web address]  ASSESSMENT:  CLINICAL IMPRESSION: Patient presents for treatment, reports she is now done with cancer treatments. Feels like she is doing better every day, no bladder symptoms at all now,  type 4 bowel movements but frequency is ~4x daily in AM but no incontinence. Pt complaint with HEP and vaginal dilators and reports she is having good progress with this. Pt denied internal reassessment today and/or rectal assessment, was educated on benefits of this but declined. Pt educated on abdominal massage, voiding mechanics, and HEP updated. Pt denied questions tolerated well. Pt agreeable to DC today and denied additional PT needs. Understands she will need new referral for future  PT needs.    OBJECTIVE IMPAIRMENTS: postural dysfunction.   ACTIVITY LIMITATIONS: continence  PARTICIPATION LIMITATIONS: community activity  PERSONAL FACTORS: 1 comorbidity: medical history   are also affecting patient's functional outcome.   REHAB POTENTIAL: Good  CLINICAL DECISION MAKING: Stable/uncomplicated  EVALUATION COMPLEXITY: Low   GOALS: Goals reviewed with patient? Yes  SHORT TERM GOALS: Target date: 03/03/23  Pt will be independent with HEP.   Baseline: Goal status: MET  2.  Pt to be I with voiding mechanics and abdominal massage for bowel habits.  Baseline:  Goal status: MET   LONG TERM GOALS: Target date: 08/03/23  Pt to be I with advanced HEP.  Baseline:  Goal status: MET  2.  Pt to demonstrate at least 5/5 bil hip strength for  improved pelvic stability and functional squats without leakage.  Baseline:  Goal status: MET  3.  Pt to demonstrate improved coordination of pelvic floor and breathing mechanics with 10# squat with appropriate synergistic patterns to decrease pain and leakage at least 75% of the time.    Baseline:  Goal status: not assessed - pt denied PT needs  4.  Pt will report her BMs are complete and at least 3x weekly due to improved bowel habits and evacuation techniques.  Baseline:  Goal status: MET  5.  Pt to be I with knack for decreased leakage with coughing and stressors.  Baseline:  Goal status: MET PLAN:  PT FREQUENCY:  1 more treatment post radiation completion, however if needed may return 1-2x weekly during cancer treatment    PT DURATION:  8 sessions  PLANNED INTERVENTIONS: 97110-Therapeutic exercises, 97530- Therapeutic activity, 97112- Neuromuscular re-education, 97535- Self Care, [address]- Manual therapy, Patient/Family education, Taping, Dry Needling, Joint mobilization, Spinal mobilization, Scar mobilization, and Biofeedback  PLAN FOR NEXT SESSION:   PHYSICAL THERAPY DISCHARGE SUMMARY  Visits from Start of Care: 2  Current functional level related to goals / functional outcomes: All STG met, LTG met except squats with weight not reassessed, pt denied additional PT needs   Remaining deficits: Bowel frequency 4x daily but reports full evacuation when now, no straining or leakage.    Education / Equipment: HEP   Patient agrees to discharge. Patient goals were partially met. Patient is being discharged due to being pleased with the current functional level.   Otelia Sergeant, PT, DPT 04/19/2510:19 PM

## 2023-04-19 NOTE — Patient Instructions (Signed)
  Complete for ~5 mins 2x daily (once in AM once in PM), gentle pressure, shouldn't be painful.

## 2023-06-08 ENCOUNTER — Other Ambulatory Visit: Payer: Self-pay | Admitting: Family Medicine

## 2023-06-08 DIAGNOSIS — E7800 Pure hypercholesterolemia, unspecified: Secondary | ICD-10-CM

## 2023-06-08 DIAGNOSIS — I700 Atherosclerosis of aorta: Secondary | ICD-10-CM

## 2023-06-09 ENCOUNTER — Other Ambulatory Visit: Payer: Self-pay

## 2023-06-15 ENCOUNTER — Other Ambulatory Visit

## 2023-06-16 ENCOUNTER — Inpatient Hospital Stay: Attending: Hematology

## 2023-06-16 ENCOUNTER — Encounter (HOSPITAL_COMMUNITY)
Admission: RE | Admit: 2023-06-16 | Discharge: 2023-06-16 | Disposition: A | Discharge status: Home / Self Care | Source: Ambulatory Visit | Attending: Hematology | Admitting: Hematology

## 2023-06-16 DIAGNOSIS — C210 Malignant neoplasm of anus, unspecified: Secondary | ICD-10-CM | POA: Insufficient documentation

## 2023-06-16 DIAGNOSIS — C211 Malignant neoplasm of anal canal: Secondary | ICD-10-CM | POA: Diagnosis not present

## 2023-06-16 LAB — CBC WITH DIFFERENTIAL (CANCER CENTER ONLY)
Abs Immature Granulocytes: 0.02 10*3/uL (ref 0.00–0.07)
Basophils Absolute: 0.1 10*3/uL (ref 0.0–0.1)
Basophils Relative: 1 %
Eosinophils Absolute: 0.2 10*3/uL (ref 0.0–0.5)
Eosinophils Relative: 2 %
HCT: 34.8 % — ABNORMAL LOW (ref 36.0–46.0)
Hemoglobin: 12 g/dL (ref 12.0–15.0)
Immature Granulocytes: 0 %
Lymphocytes Relative: 8 %
Lymphs Abs: 0.6 10*3/uL — ABNORMAL LOW (ref 0.7–4.0)
MCH: 33.1 pg (ref 26.0–34.0)
MCHC: 34.5 g/dL (ref 30.0–36.0)
MCV: 95.9 fL (ref 80.0–100.0)
Monocytes Absolute: 0.8 10*3/uL (ref 0.1–1.0)
Monocytes Relative: 11 %
Neutro Abs: 6.1 10*3/uL (ref 1.7–7.7)
Neutrophils Relative %: 78 %
Platelet Count: 202 10*3/uL (ref 150–400)
RBC: 3.63 MIL/uL — ABNORMAL LOW (ref 3.87–5.11)
RDW: 12.8 % (ref 11.5–15.5)
WBC Count: 7.8 10*3/uL (ref 4.0–10.5)
nRBC: 0 % (ref 0.0–0.2)

## 2023-06-16 LAB — CMP (CANCER CENTER ONLY)
ALT: 10 U/L (ref 0–44)
AST: 14 U/L — ABNORMAL LOW (ref 15–41)
Albumin: 4.2 g/dL (ref 3.5–5.0)
Alkaline Phosphatase: 43 U/L (ref 38–126)
Anion gap: 6 (ref 5–15)
BUN: 9 mg/dL (ref 8–23)
CO2: 28 mmol/L (ref 22–32)
Calcium: 9.6 mg/dL (ref 8.9–10.3)
Chloride: 101 mmol/L (ref 98–111)
Creatinine: 0.46 mg/dL (ref 0.44–1.00)
GFR, Estimated: >60 mL/min (ref >60)
Glucose, Bld: 102 mg/dL — ABNORMAL HIGH (ref 70–99)
Potassium: 4.3 mmol/L (ref 3.5–5.1)
Sodium: 135 mmol/L (ref 135–145)
Total Bilirubin: 0.6 mg/dL (ref 0.0–1.2)
Total Protein: 6.8 g/dL (ref 6.5–8.1)

## 2023-06-16 LAB — GLUCOSE, CAPILLARY: Glucose-Capillary: 99 mg/dL (ref 70–99)

## 2023-06-16 MED ORDER — FLUDEOXYGLUCOSE F - 18 (FDG) INJECTION
6.1000 | Freq: Once | INTRAVENOUS | Status: AC
Start: 2023-06-16 — End: 2023-06-16
  Administered 2023-06-16: 6.1 via INTRAVENOUS

## 2023-06-21 NOTE — Assessment & Plan Note (Signed)
 cT1N0M0 stage I -Discussed screening colonoscopy -Seen by provider secondary Maisie Fus, felt not to be a candidate for endoscopy resection. -Patient started concurrent chemoradiation on February 07, 2023 with mitomycin and 5-FU regimen, tolerated the treatment well overall, she completed chemoradiation on March 16, 2023.

## 2023-06-22 ENCOUNTER — Encounter: Payer: Self-pay | Admitting: Hematology

## 2023-06-22 ENCOUNTER — Inpatient Hospital Stay: Attending: Hematology | Admitting: Hematology

## 2023-06-22 VITALS — BP 120/70 | HR 111 | Temp 98.7°F | Resp 15 | Ht 61.0 in | Wt 119.7 lb

## 2023-06-22 DIAGNOSIS — C210 Malignant neoplasm of anus, unspecified: Secondary | ICD-10-CM | POA: Diagnosis not present

## 2023-06-22 DIAGNOSIS — Z79899 Other long term (current) drug therapy: Secondary | ICD-10-CM | POA: Diagnosis not present

## 2023-06-22 NOTE — Progress Notes (Signed)
 [facility]   [phone] [phone]   Clinic Follow up Note   Patient Care Team: Abram Abraham, NP-C as PCP - General (Family Medicine) Sergio Dandy, MD as Consulting Physician (Gastroenterology) [facility] as Consulting Physician (Ophthalmology) Burton, Lacie K, NP as Nurse Practitioner (Hematology and Oncology)  Date of Service:  06/22/2023  CHIEF COMPLAINT: f/u of anal cancer  CURRENT THERAPY:  Cancer surveillance  Oncology History   Anal cancer (HCC) cT1N0M0 stage I -Discussed screening colonoscopy -Seen by provider secondary Andy Bannister, felt not to be a candidate for endoscopy resection. -Patient started concurrent chemoradiation on February 07, 2023 with mitomycin  and 5-FU regimen, tolerated the treatment well overall, she completed chemoradiation on March 16, 2023.  Assessment & Plan Anal cancer Completed treatment in February 2023. Recent PET scan shows no residual uptake in the rectum, indicating no visible tumor. Previous tumor was small and located in the rectum. No abnormalities in bones, lungs, or liver. Findings correspond with scar tissue observed by Dr. Andy Bannister in April. Blood work shows slight anemia but overall recovery is good with normal hemoglobin, platelet, and white blood cell counts. - Order CT scan in 6 months, contingent on insurance approval - Schedule follow-up appointment in 6 months - Coordinate lab work a week before the follow-up appointment - Ensure CT scan is done on the same day as the follow-up appointment - Continue follow-up with Dr. Andy Bannister every 3 months for endoscopic exams  Plan - I personally reviewed her restaging PET scan images with patient, she had complete radiographic response, no evidence of residual cancer to me.  Formal report is still pending. - She will follow-up with Dr. Andy Bannister for endoscope every 3 months, next due in July - Follow-up with me in 6 months with lab  and CT scan 1 week before   SUMMARY OF ONCOLOGIC HISTORY: Oncology History  Anal cancer (HCC)  01/25/2023 Initial Diagnosis   Anal cancer (HCC)   02/07/2023 -  Chemotherapy   Patient is on Treatment Plan : ANUS Mitomycin  D1,28 + 5FU D1-4, 28-31 q32d     02/07/2023 Cancer Staging   Staging form: Anus, AJCC V9 - Clinical: Stage I (cT1, cN0, cM0) - Signed by Burton, Lacie K, NP on 02/07/2023 Stage prefix: Initial diagnosis      Discussed the use of AI scribe software for clinical note transcription with the patient, who gave verbal consent to proceed.  History of Present Illness Brittany Summers is a 68 year old female with anal cancer who presents for follow-up.  She experiences altered bowel habits with several small, loose stools in the mornings, approximately four times in twenty-four hours. Hydration is adequate.  She completed treatment in February 2023. A recent PET scan performed six days ago awaits official reading. The last scan in December was pre-treatment. Follow-up visits occur every three months, with the last in April showing only scar tissue.  Blood work indicates slight improvement in red count and hemoglobin, previously low post-chemoradiation. Kidney and liver functions are normal, and platelet and white blood counts have normalized. She feels better since two months post-treatment.     All other systems were reviewed with the patient and are negative.  MEDICAL HISTORY:  Past Medical History:  Diagnosis Date   Chronic hepatitis C without hepatic coma (HCC) 09/22/2016   finished treatment for this   Gallstone    known with h/o cholecystitis pain, no recent recurrence   Hypertension    no  meds taken    SURGICAL HISTORY: Past Surgical History:  Procedure Laterality Date   BREAST BIOPSY Left 2017   CHOLECYSTECTOMY N/A 08/04/2022   Procedure: LAPAROSCOPIC CHOLECYSTECTOMY;  Surgeon: Lockie Rima, MD;  Location: [facility] OR;  Service: General;  Laterality: N/A;    COLONOSCOPY     MAXILLARY SINUS LIFT     MOUTH SURGERY     ORIF ANKLE FRACTURE Left 07/06/2018   Procedure: OPEN REDUCTION INTERNAL FIXATION (ORIF) left ankle trimalleolar fracture;  Surgeon: Amada Backer, MD;  Location: [facility];  Service: Orthopedics;  Laterality: Left;    TONSILLECTOMY     age 11   UPPER GASTROINTESTINAL ENDOSCOPY      I have reviewed the social history and family history with the patient and they are unchanged from previous note.  ALLERGIES:  is allergic to penicillins.  MEDICATIONS:  Current Outpatient Medications  Medication Sig Dispense Refill   atorvastatin  (LIPITOR) 10 MG tablet TAKE 1 TABLET BY MOUTH DAILY. Please schedule office visit for further refills. 90 tablet 0   Cholecalciferol (VITAMIN D3) 50 MCG (2000 UT) TABS Take 2,000 Units by mouth in the morning and at bedtime.     diphenoxylate -atropine  (LOMOTIL ) 2.5-0.025 MG tablet Take 1-2 tablets by mouth 4 (four) times daily as needed for diarrhea or loose stools. 60 tablet 1   HYDROcodone -acetaminophen  (NORCO/VICODIN) 5-325 MG tablet Take 1 tablet by mouth every 6 (six) hours as needed for moderate pain (pain score 4-6). 20 tablet 0   magic mouthwash (nystatin , lidocaine , diphenhydrAMINE, alum & mag hydroxide) suspension Swish 5 mLs to coat mouth and spit out 4 (four) times daily. 240 mL 0   ondansetron  (ZOFRAN ) 8 MG tablet Take 1 tablet (8 mg total) by mouth every 8 (eight) hours as needed for nausea or vomiting. 30 tablet 1   potassium chloride  SA (KLOR-CON  M) 20 MEQ tablet Take 1 tablet (20 mEq total) by mouth 2 (two) times daily. 10 tablet 0   prochlorperazine  (COMPAZINE ) 10 MG tablet Take 1 tablet (10 mg total) by mouth every 6 (six) hours as needed for nausea or vomiting. 30 tablet 1   No current facility-administered medications for this visit.   Facility-Administered Medications Ordered in Other Visits  Medication Dose Route Frequency Provider Last Rate Last Admin   indocyanine  green (IC-GREEN ) injection 25 mg  25 mg Topical Once Lockie Rima, MD        PHYSICAL EXAMINATION: ECOG PERFORMANCE STATUS: 0 - Asymptomatic  Vitals:   06/22/23 1017 06/22/23 1021  BP: (!) 151/83 120/70  Pulse: (!) 111   Resp: 15   Temp: 98.7 F (37.1 C)   SpO2: 98%    Wt Readings from Last 3 Encounters:  06/22/23 119 lb 11.2 oz (54.3 kg)  03/22/23 123 lb 1.6 oz (55.8 kg)  03/16/23 127 lb 3.2 oz (57.7 kg)     GENERAL:alert, no distress and comfortable SKIN: skin color, texture, turgor are normal, no rashes or significant lesions EYES: normal, Conjunctiva are pink and non-injected, sclera clear Musculoskeletal:no cyanosis of digits and no clubbing  NEURO: alert & oriented x 3 with fluent speech, no focal motor/sensory deficits  Physical Exam    LABORATORY DATA:  I have reviewed the data as listed    Latest Ref Rng & Units 06/16/2023    9:04 AM 04/05/2023   10:31 AM 03/22/2023    9:00 AM  CBC  WBC 4.0 - 10.5 K/uL 7.8  4.5  3.1   Hemoglobin 12.0 -  15.0 g/dL 19.1  9.5  47.8   Hematocrit 36.0 - 46.0 % 34.8  28.4  29.6   Platelets 150 - 400 K/uL 202  92  63         Latest Ref Rng & Units 06/16/2023    9:04 AM 04/05/2023   10:31 AM 03/22/2023    9:00 AM  CMP  Glucose 70 - 99 mg/dL 295  621  308   BUN 8 - 23 mg/dL 9  12  7    Creatinine 0.44 - 1.00 mg/dL 6.57  8.46  9.62   Sodium 135 - 145 mmol/L 135  140  136   Potassium 3.5 - 5.1 mmol/L 4.3  4.0  3.0   Chloride 98 - 111 mmol/L 101  105  95   CO2 22 - 32 mmol/L 28  29  34   Calcium  8.9 - 10.3 mg/dL 9.6  9.3  8.9   Total Protein 6.5 - 8.1 g/dL 6.8  6.5  6.4   Total Bilirubin 0.0 - 1.2 mg/dL 0.6  0.6  0.5   Alkaline Phos 38 - 126 U/L 43  48  50   AST 15 - 41 U/L 14  15  20    ALT 0 - 44 U/L 10  18  25        RADIOGRAPHIC STUDIES: I have personally reviewed the radiological images as listed and agreed with the findings in the report. No results found.    Orders Placed This Encounter  Procedures   CT CHEST  ABDOMEN PELVIS W CONTRAST    Standing Status:   Future    Expected Date:   12/14/2023    Expiration Date:   06/21/2024    If indicated for the ordered procedure, I authorize the administration of contrast media per Radiology protocol:   Yes    Does the patient have a contrast media/X-ray dye allergy?:   No    Preferred imaging location?:   [facility]    If indicated for the ordered procedure, I authorize the administration of oral contrast media per Radiology protocol:   Yes   All questions were answered. The patient knows to call the clinic with any problems, questions or concerns. No barriers to learning was detected. The total time spent in the appointment was 25 minutes, including review of chart and various tests results, discussions about plan of care and coordination of care plan     Sonja [place], MD 06/22/2023

## 2023-06-23 ENCOUNTER — Other Ambulatory Visit: Payer: Self-pay

## 2023-06-24 ENCOUNTER — Encounter: Payer: Self-pay | Admitting: Hematology

## 2023-06-27 ENCOUNTER — Other Ambulatory Visit: Payer: Self-pay

## 2023-07-05 ENCOUNTER — Ambulatory Visit

## 2023-07-05 VITALS — Ht 61.0 in | Wt 116.0 lb

## 2023-07-05 DIAGNOSIS — Z0100 Encounter for examination of eyes and vision without abnormal findings: Secondary | ICD-10-CM

## 2023-07-05 DIAGNOSIS — K635 Polyp of colon: Secondary | ICD-10-CM | POA: Diagnosis not present

## 2023-07-05 DIAGNOSIS — Z0000 Encounter for general adult medical examination without abnormal findings: Secondary | ICD-10-CM

## 2023-07-05 NOTE — Patient Instructions (Addendum)
 Brittany Summers , Thank you for taking time out of your busy schedule to complete your Annual Wellness Visit with me. I enjoyed our conversation and look forward to speaking with you again next year. I, as well as your care team,  appreciate your ongoing commitment to your health goals. Please review the following plan we discussed and let me know if I can assist you in the future. Your Game plan/ To Do List    Referrals: If you haven't heard from the office you've been referred to, please reach out to them at the phone provided.  Referral to Dr Linnell Richardson. Leonia Raman (GI) for a repeat Colonoscopy; Referral to [facility] for an eye exam for 2025. Follow up Visits: Next Medicare AWV with our clinical staff: 07/05/2024   Have you seen your provider in the last 6 months (3 months if uncontrolled diabetes)? Yes Next Office Visit with your provider: 07/07/2023  Clinician Recommendations:  Aim for 30 minutes of exercise or brisk walking, 6-8 glasses of water, and 5 servings of fruits and vegetables each day. Educated and advised on getting the Shingles vaccines in 2025.      This is a list of the screening recommended for you and due dates:  Health Maintenance  Topic Date Due   Zoster (Shingles) Vaccine (1 of 2) Never done   COVID-19 Vaccine (3 - Pfizer risk series) 04/17/2019   Flu Shot  08/19/2023   Screening for Lung Cancer  09/23/2023   Colon Cancer Screening  12/21/2023   Medicare Annual Wellness Visit  07/04/2024   Mammogram  10/27/2024   DTaP/Tdap/Td vaccine (2 - Td or Tdap) 03/19/2026   Pneumococcal Vaccine for age over 63  Completed   DEXA scan (bone density measurement)  Completed   Hepatitis C Screening  Completed   HPV Vaccine  Aged Out   Meningitis B Vaccine  Aged Out    Advanced directives: (Copy Requested) Please bring a copy of your health care power of attorney and living will to the office to be added to your chart at your convenience. You can mail to [facility] [address] [place], [place] [address] or email to [email or web address] Advance Care Planning is important because it:  [x]  Makes sure you receive the medical care that is consistent with your values, goals, and preferences  [x]  It provides guidance to your family and loved ones and reduces their decisional burden about whether or not they are making the right decisions based on your wishes.  Follow the link provided in your after visit summary or read over the paperwork we have mailed to you to help you started getting your Advance Directives in place. If you need assistance in completing these, please reach out to us  so that we can help you!

## 2023-07-05 NOTE — Progress Notes (Addendum)
 Subjective:   Brittany Summers is a 68 y.o. who presents for a Medicare Wellness preventive visit.  As a reminder, Annual Wellness Visits don't include a physical exam, and some assessments may be limited, especially if this visit is performed virtually. We may recommend an in-person follow-up visit with your provider if needed.  Visit Complete: Virtual I connected with  Brittany Summers on 07/07/23 by a audio enabled telemedicine application and verified that I am speaking with the correct person using two identifiers.  Patient Location: Home  Provider Location: Office/Clinic  I discussed the limitations of evaluation and management by telemedicine. The patient expressed understanding and agreed to proceed.  Vital Signs: Because this visit was a virtual/telehealth visit, some criteria may be missing or patient reported. Any vitals not documented were not able to be obtained and vitals that have been documented are patient reported.  VideoDeclined- This patient declined Librarian, academic. Therefore the visit was completed with audio only.  Persons Participating in Visit: Patient.  AWV Questionnaire: Yes: Patient Medicare AWV questionnaire was completed by the patient on 07/04/2023; I have confirmed that all information answered by patient is correct and no changes since this date.  Cardiac Risk Factors include: advanced age (>61men, >55 women);hypertension;dyslipidemia     Objective:    Today's Vitals   07/05/23 0934  Weight: 116 lb (52.6 kg)  Height: 5' 1 (1.549 m)  PainSc: 0-No pain   Body mass index is 21.92 kg/m.     07/05/2023    9:32 AM 02/22/2023    9:43 AM 02/22/2023    9:38 AM 02/01/2023    9:51 AM 07/28/2022    1:13 PM 03/11/2022   10:04 AM 07/06/2018   10:55 AM  Advanced Directives  Does Patient Have a Medical Advance Directive? Yes No No No No No No  Type of Estate agent of [place];Living will        Copy of  Healthcare Power of Attorney in Chart? No - copy requested        Would patient like information on creating a medical advance directive?    No - Patient declined No - Patient declined No - Patient declined No - Patient declined      Data saved with a previous flowsheet row definition    Current Medications (verified) Outpatient Encounter Medications as of 07/05/2023  Medication Sig   atorvastatin  (LIPITOR) 10 MG tablet TAKE 1 TABLET BY MOUTH DAILY. Please schedule office visit for further refills.   Cholecalciferol (VITAMIN D3) 50 MCG (2000 UT) TABS Take 2,000 Units by mouth in the morning and at bedtime.   diphenoxylate -atropine  (LOMOTIL ) 2.5-0.025 MG tablet Take 1-2 tablets by mouth 4 (four) times daily as needed for diarrhea or loose stools.   HYDROcodone -acetaminophen  (NORCO/VICODIN) 5-325 MG tablet Take 1 tablet by mouth every 6 (six) hours as needed for moderate pain (pain score 4-6). (Patient not taking: Reported on 07/05/2023)   magic mouthwash (nystatin , lidocaine , diphenhydrAMINE, alum & mag hydroxide) suspension Swish 5 mLs to coat mouth and spit out 4 (four) times daily. (Patient not taking: Reported on 07/05/2023)   ondansetron  (ZOFRAN ) 8 MG tablet Take 1 tablet (8 mg total) by mouth every 8 (eight) hours as needed for nausea or vomiting. (Patient not taking: Reported on 07/05/2023)   potassium chloride  SA (KLOR-CON  M) 20 MEQ tablet Take 1 tablet (20 mEq total) by mouth 2 (two) times daily. (Patient not taking: Reported on 07/05/2023)   prochlorperazine  (COMPAZINE )  10 MG tablet Take 1 tablet (10 mg total) by mouth every 6 (six) hours as needed for nausea or vomiting. (Patient not taking: Reported on 07/05/2023)   Facility-Administered Encounter Medications as of 07/05/2023  Medication   indocyanine green  (IC-GREEN ) injection 25 mg    Allergies (verified) Penicillins   History: Past Medical History:  Diagnosis Date   Chronic hepatitis C without hepatic coma (HCC) 09/22/2016    finished treatment for this   Gallstone    known with h/o cholecystitis pain, no recent recurrence   Hypertension    no meds taken   Past Surgical History:  Procedure Laterality Date   BREAST BIOPSY Left 2017   CHOLECYSTECTOMY N/A 08/04/2022   Procedure: LAPAROSCOPIC CHOLECYSTECTOMY;  Surgeon: Aron Shoulders, MD;  Location: [facility] OR;  Service: General;  Laterality: N/A;   COLONOSCOPY     MAXILLARY SINUS LIFT     MOUTH SURGERY     ORIF ANKLE FRACTURE Left 07/06/2018   Procedure: OPEN REDUCTION INTERNAL FIXATION (ORIF) left ankle trimalleolar fracture;  Surgeon: Kit Rush, MD;  Location: [facility];  Service: Orthopedics;  Laterality: Left;    TONSILLECTOMY     age 85   UPPER GASTROINTESTINAL ENDOSCOPY     Family History  Problem Relation Age of Onset   Hypertension Mother    Breast cancer Mother        Dx in her early 43's   Hypertension Father    Diabetes Father    Leukemia Father    Colon cancer Maternal Uncle 42 - 79   Breast cancer Maternal Grandmother    Stomach cancer Maternal Grandmother 68   Stomach cancer Maternal Great-grandmother    Colon polyps Neg Hx    Rectal cancer Neg Hx    Esophageal cancer Neg Hx    Social History   Socioeconomic History   Marital status: Married    Spouse name: Not on file   Number of children: 0   Years of education: Not on file   Highest education level: Associate degree: occupational, Scientist, product/process development, or vocational program  Occupational History   Occupation: retired  Tobacco Use   Smoking status: Former    Current packs/day: 0.00    Types: Cigarettes    Quit date: 07/15/2015    Years since quitting: 7.9   Smokeless tobacco: Never  Vaping Use   Vaping status: Never Used  Substance and Sexual Activity   Alcohol use: No    Alcohol/week: 0.0 standard drinks of alcohol    Comment: as of 04-28-16 no alcohol    Drug use: No   Sexual activity: Not Currently    Partners: Male  Other Topics Concern   Not on file   Social History Narrative   Not on file   Social Drivers of Health   Financial Resource Strain: Low Risk  (07/05/2023)   Overall Financial Resource Strain (CARDIA)    Difficulty of Paying Living Expenses: Not very hard  Food Insecurity: No Food Insecurity (07/05/2023)   Hunger Vital Sign    Worried About Running Out of Food in the Last Year: Never true    Ran Out of Food in the Last Year: Never true  Transportation Needs: No Transportation Needs (07/05/2023)   PRAPARE - Administrator, Civil Service (Medical): No    Lack of Transportation (Non-Medical): No  Physical Activity: Insufficiently Active (07/05/2023)   Exercise Vital Sign    Days of Exercise per Week: 3 days    Minutes  of Exercise per Session: 30 min  Stress: No Stress Concern Present (07/05/2023)   [employer] of Occupational Health - Occupational Stress Questionnaire    Feeling of Stress: Not at all  Social Connections: Socially Integrated (07/05/2023)   Social Connection and Isolation Panel    Frequency of Communication with Friends and Family: More than three times a week    Frequency of Social Gatherings with Friends and Family: Three times a week    Attends Religious Services: More than 4 times per year    Active Member of Clubs or Organizations: No    Attends Engineer, structural: More than 4 times per year    Marital Status: Married    Tobacco Counseling Counseling given: No    Clinical Intake:  Pre-visit preparation completed: Yes  Pain : No/denies pain Pain Score: 0-No pain     BMI - recorded: 21.92 Nutritional Risks: None Diabetes: No  No results found for: HGBA1C   How often do you need to have someone help you when you read instructions, pamphlets, or other written materials from your doctor or pharmacy?: 1 - Never  Interpreter Needed?: No  Information entered by :: Verdie Saba, CMA   Activities of Daily Living     07/05/2023    9:36 AM 07/04/2023    9:36 AM   In your present state of health, do you have any difficulty performing the following activities:  Hearing? 0 0  Vision? 0 0  Difficulty concentrating or making decisions? 0 0  Walking or climbing stairs? 0 0  Dressing or bathing? 0 0  Doing errands, shopping? 0 0  Preparing Food and eating ? N N  Using the Toilet? N N  In the past six months, have you accidently leaked urine? N N  Do you have problems with loss of bowel control? N N  Managing your Medications? N N  Managing your Finances? N N  Housekeeping or managing your Housekeeping? N N    Patient Care Team: Lendia Boby CROME, NP-C as PCP - General (Family Medicine) Nandigam, Kavitha V, MD as Consulting Physician (Gastroenterology) [facility] as Consulting Physician (Ophthalmology) Burton, Lacie K, NP as Nurse Practitioner (Hematology and Oncology)  I have updated your Care Teams any recent Medical Services you may have received from other providers in the past year.     Assessment:   This is a routine wellness examination for Brittany Summers.  Hearing/Vision screen Hearing Screening - Comments:: Denies hearing difficulties   Vision Screening - Comments:: Wears rx glasses - due to see Dr Octavia   Goals Addressed               This Visit's Progress     Patient Stated (pt-stated)        Patient stated she plans to stay active and exercise       Depression Screen     07/05/2023    9:37 AM 02/01/2023   10:01 AM 03/18/2022   11:02 AM 03/11/2022   10:08 AM 02/10/2022    1:46 PM 09/26/2019   10:05 AM 09/12/2017   10:08 AM  PHQ 2/9 Scores  PHQ - 2 Score 0 0 0 0 0 0 0  PHQ- 9 Score 0     0     Fall Risk     07/05/2023    9:36 AM 07/04/2023    9:36 AM 03/18/2022   11:03 AM 03/11/2022   10:05 AM 02/10/2022    1:46 PM  Fall Risk   Falls in the past year? 0 0 0 0 0  Number falls in past yr: 0 0 0 0 0  Injury with Fall? 0 0 0 0 0  Risk for fall due to : No Fall Risks  No Fall Risks No Fall Risks No Fall  Risks  Follow up Falls evaluation completed;Falls prevention discussed  Falls evaluation completed Falls prevention discussed Falls evaluation completed      Data saved with a previous flowsheet row definition    MEDICARE RISK AT HOME:  Medicare Risk at Home Any stairs in or around the home?: Yes If so, are there any without handrails?: No Home free of loose throw rugs in walkways, pet beds, electrical cords, etc?: Yes Adequate lighting in your home to reduce risk of falls?: Yes Life alert?: No Use of a cane, walker or w/c?: No Grab bars in the bathroom?: No Shower chair or bench in shower?: No Elevated toilet seat or a handicapped toilet?: No  TIMED UP AND GO:  Was the test performed?  No  Cognitive Function: 6CIT completed        07/05/2023    9:38 AM 03/11/2022   10:08 AM 09/26/2019   10:02 AM  6CIT Screen  What Year? 0 points 0 points 0 points  What month? 0 points 0 points 0 points  What time? 0 points 0 points 0 points  Count back from 20 0 points 0 points 0 points  Months in reverse 0 points 0 points 0 points  Repeat phrase 0 points 0 points 0 points  Total Score 0 points 0 points 0 points    Immunizations Immunization History  Administered Date(s) Administered   Hepatitis A, Adult 10/21/2016, 04/25/2017   Hepatitis B, ADULT 10/21/2016, 11/22/2016, 04/25/2017   Influenza,inj,Quad PF,6+ Mos 03/18/2016, 10/11/2016, 09/12/2017   Influenza-Unspecified 11/06/2018   PFIZER(Purple Top)SARS-COV-2 Vaccination 02/24/2019, 03/20/2019   PNEUMOCOCCAL CONJUGATE-20 03/18/2022   Pneumococcal Polysaccharide-23 09/22/2016   Tdap 03/18/2016    Screening Tests Health Maintenance  Topic Date Due   Zoster Vaccines- Shingrix (1 of 2) Never done   COVID-19 Vaccine (3 - Pfizer risk series) 04/17/2019   INFLUENZA VACCINE  08/19/2023   Lung Cancer Screening  09/23/2023   Colonoscopy  12/21/2023   Medicare Annual Wellness (AWV)  07/04/2024   MAMMOGRAM  10/27/2024   DTaP/Tdap/Td  (2 - Td or Tdap) 03/19/2026   Pneumococcal Vaccine: 50+ Years  Completed   DEXA SCAN  Completed   Hepatitis C Screening  Completed   HPV VACCINES  Aged Out   Meningococcal B Vaccine  Aged Out    Health Maintenance  Health Maintenance Due  Topic Date Due   Zoster Vaccines- Shingrix (1 of 2) Never done   COVID-19 Vaccine (3 - Pfizer risk series) 04/17/2019   Health Maintenance Items Addressed:  I have recommended that this patient have a immunization for Shingles  but she declines at this time. I have discussed the risks and benefits of this procedure with her. The patient verbalizes understanding.   Referral sent to Dr MARLA. Shila ([facility]) for a repeat Colonoscopy (dx-colon polyps)  Lung Cancer Screening status: Patient had a PET Scan in 2025. Discussed w/PCP.  Additional Screening:  Vision Screening: Recommended annual ophthalmology exams for early detection of glaucoma and other disorders of the eye. Would you like a referral to an eye doctor? No  Referral send to [facility] for an eye exam.  Dental Screening: Recommended annual dental exams for  proper oral hygiene  Community Resource Referral / Chronic Care Management: CRR required this visit?  No   CCM required this visit?  No   Plan:    I have personally reviewed and noted the following in the patient's chart:   Medical and social history Use of alcohol, tobacco or illicit drugs  Current medications and supplements including opioid prescriptions. Patient is not currently taking opioid prescriptions. Functional ability and status Nutritional status Physical activity Advanced directives List of other physicians Hospitalizations, surgeries, and ER visits in previous 12 months Vitals Screenings to include cognitive, depression, and falls Referrals and appointments  In addition, I have reviewed and discussed with patient certain preventive protocols, quality metrics, and best practice recommendations. A  written personalized care plan for preventive services as well as general preventive health recommendations were provided to patient.   Verdie CHRISTELLA Saba, CMA   07/07/2023   After Visit Summary: (MyChart) Due to this being a telephonic visit, the after visit summary with patients personalized plan was offered to patient via MyChart   Notes: Please refer to Routing Comments.

## 2023-07-06 ENCOUNTER — Other Ambulatory Visit: Payer: Self-pay

## 2023-07-07 ENCOUNTER — Ambulatory Visit: Admitting: Family Medicine

## 2023-07-07 ENCOUNTER — Ambulatory Visit

## 2023-07-07 ENCOUNTER — Encounter: Payer: Self-pay | Admitting: Family Medicine

## 2023-07-07 VITALS — BP 138/78 | HR 92 | Temp 98.0°F | Ht 61.0 in | Wt 116.6 lb

## 2023-07-07 DIAGNOSIS — I10 Essential (primary) hypertension: Secondary | ICD-10-CM | POA: Diagnosis not present

## 2023-07-07 DIAGNOSIS — I700 Atherosclerosis of aorta: Secondary | ICD-10-CM | POA: Diagnosis not present

## 2023-07-07 DIAGNOSIS — E7800 Pure hypercholesterolemia, unspecified: Secondary | ICD-10-CM | POA: Diagnosis not present

## 2023-07-07 DIAGNOSIS — E559 Vitamin D deficiency, unspecified: Secondary | ICD-10-CM | POA: Diagnosis not present

## 2023-07-07 DIAGNOSIS — J439 Emphysema, unspecified: Secondary | ICD-10-CM

## 2023-07-07 NOTE — Progress Notes (Signed)
 Subjective:     Patient ID: Brittany Summers, female    DOB: 1955-02-11, 68 y.o.   MRN: [id number]  No chief complaint on file.   HPI   History of Present Illness         She is here to follow-up on hyperlipidemia.  She has been taking her statin daily.  She has been treated for anal cancer and recently had a good PET scan.  Taking a vitamin D  supplement daily.     Health Maintenance Due  Topic Date Due   Zoster Vaccines- Shingrix (1 of 2) Never done   COVID-19 Vaccine (3 - Pfizer risk series) 04/17/2019    Past Medical History:  Diagnosis Date   Allergy 1960   penicillin   Anemia    Cancer (HCC)    Chronic hepatitis C without hepatic coma (HCC) 09/22/2016   finished treatment for this   Gallstone    known with h/o cholecystitis pain, no recent recurrence   Hypertension    no meds taken    Past Surgical History:  Procedure Laterality Date   BREAST BIOPSY Left 2017   CHOLECYSTECTOMY N/A 08/04/2022   Procedure: LAPAROSCOPIC CHOLECYSTECTOMY;  Surgeon: Lockie Rima, MD;  Location: [facility] OR;  Service: General;  Laterality: N/A;   COLONOSCOPY     FRACTURE SURGERY  07/06/2018   Ankle surgery left   MAXILLARY SINUS LIFT     MOUTH SURGERY     ORIF ANKLE FRACTURE Left 07/06/2018   Procedure: OPEN REDUCTION INTERNAL FIXATION (ORIF) left ankle trimalleolar fracture;  Surgeon: Amada Backer, MD;  Location: [facility];  Service: Orthopedics;  Laterality: Left;    TONSILLECTOMY     age 21   UPPER GASTROINTESTINAL ENDOSCOPY      Family History  Problem Relation Age of Onset   Hypertension Mother    Breast cancer Mother        Dx in her early 74's   Arthritis Mother    Hypertension Father    Diabetes Father    Leukemia Father    Colon cancer Maternal Uncle 70 - 79   Breast cancer Maternal Grandmother    Stomach cancer Maternal Grandmother 22   Stomach cancer Maternal Great-grandmother    Diabetes Sister    Colon polyps Neg Hx    Rectal  cancer Neg Hx    Esophageal cancer Neg Hx     Social History   Socioeconomic History   Marital status: Married    Spouse name: Not on file   Number of children: 0   Years of education: Not on file   Highest education level: Associate degree: occupational, Scientist, product/process development, or vocational program  Occupational History   Occupation: retired  Tobacco Use   Smoking status: Former    Current packs/day: 0.00    Types: Cigarettes    Quit date: 07/15/2015    Years since quitting: 7.9   Smokeless tobacco: Never  Vaping Use   Vaping status: Never Used  Substance and Sexual Activity   Alcohol use: No    Alcohol/week: 0.0 standard drinks of alcohol    Comment: as of 04-28-16 no alcohol    Drug use: No   Sexual activity: Not Currently    Partners: Male  Other Topics Concern   Not on file  Social History Narrative   Not on file   Social Drivers of Health   Financial Resource Strain: Low Risk  (07/05/2023)   Overall Financial Resource Strain (CARDIA)  Difficulty of Paying Living Expenses: Not very hard  Food Insecurity: No Food Insecurity (07/05/2023)   Hunger Vital Sign    Worried About Running Out of Food in the Last Year: Never true    Ran Out of Food in the Last Year: Never true  Transportation Needs: No Transportation Needs (07/05/2023)   PRAPARE - Administrator, Civil Service (Medical): No    Lack of Transportation (Non-Medical): No  Physical Activity: Insufficiently Active (07/05/2023)   Exercise Vital Sign    Days of Exercise per Week: 3 days    Minutes of Exercise per Session: 30 min  Stress: No Stress Concern Present (07/05/2023)   [employer] of Occupational Health - Occupational Stress Questionnaire    Feeling of Stress: Not at all  Social Connections: Socially Integrated (07/05/2023)   Social Connection and Isolation Panel    Frequency of Communication with Friends and Family: More than three times a week    Frequency of Social Gatherings with Friends and  Family: Three times a week    Attends Religious Services: More than 4 times per year    Active Member of Clubs or Organizations: No    Attends Banker Meetings: More than 4 times per year    Marital Status: Married  Catering manager Violence: Not At Risk (07/05/2023)   Humiliation, Afraid, Rape, and Kick questionnaire    Fear of Current or Ex-Partner: No    Emotionally Abused: No    Physically Abused: No    Sexually Abused: No    Outpatient Medications Prior to Visit  Medication Sig Dispense Refill   atorvastatin  (LIPITOR) 10 MG tablet TAKE 1 TABLET BY MOUTH DAILY. Please schedule office visit for further refills. 90 tablet 0   Cholecalciferol (VITAMIN D3) 50 MCG (2000 UT) TABS Take 2,000 Units by mouth in the morning and at bedtime.     diphenoxylate -atropine  (LOMOTIL ) 2.5-0.025 MG tablet Take 1-2 tablets by mouth 4 (four) times daily as needed for diarrhea or loose stools. 60 tablet 1   magic mouthwash (nystatin , lidocaine , diphenhydrAMINE, alum & mag hydroxide) suspension Swish 5 mLs to coat mouth and spit out 4 (four) times daily. (Patient not taking: Reported on 07/07/2023) 240 mL 0   HYDROcodone -acetaminophen  (NORCO/VICODIN) 5-325 MG tablet Take 1 tablet by mouth every 6 (six) hours as needed for moderate pain (pain score 4-6). (Patient not taking: Reported on 07/07/2023) 20 tablet 0   ondansetron  (ZOFRAN ) 8 MG tablet Take 1 tablet (8 mg total) by mouth every 8 (eight) hours as needed for nausea or vomiting. (Patient not taking: Reported on 07/07/2023) 30 tablet 1   potassium chloride  SA (KLOR-CON  M) 20 MEQ tablet Take 1 tablet (20 mEq total) by mouth 2 (two) times daily. (Patient not taking: Reported on 07/07/2023) 10 tablet 0   prochlorperazine  (COMPAZINE ) 10 MG tablet Take 1 tablet (10 mg total) by mouth every 6 (six) hours as needed for nausea or vomiting. (Patient not taking: Reported on 07/07/2023) 30 tablet 1   Facility-Administered Medications Prior to Visit  Medication  Dose Route Frequency Provider Last Rate Last Admin   indocyanine green  (IC-GREEN ) injection 25 mg  25 mg Topical Once Byerly, Faera, MD        Allergies  Allergen Reactions   Penicillins     Has received Ancef  in the past    Review of Systems  Constitutional:  Negative for chills, fever and malaise/fatigue.  Respiratory:  Negative for shortness of breath.   Cardiovascular:  Negative for chest pain, palpitations and leg swelling.  Gastrointestinal:  Negative for abdominal pain, constipation, diarrhea, nausea and vomiting.  Genitourinary:  Negative for dysuria, frequency and urgency.  Neurological:  Negative for dizziness, focal weakness and headaches.  Psychiatric/Behavioral:  Negative for depression. The patient is not nervous/anxious.        Objective:    Physical Exam Constitutional:      General: She is not in acute distress.    Appearance: She is not ill-appearing.  HENT:     Mouth/Throat:     Mouth: Mucous membranes are moist.     Pharynx: Oropharynx is clear.   Eyes:     Extraocular Movements: Extraocular movements intact.     Conjunctiva/sclera: Conjunctivae normal.    Cardiovascular:     Rate and Rhythm: Normal rate and regular rhythm.  Pulmonary:     Effort: Pulmonary effort is normal.     Breath sounds: Normal breath sounds.   Musculoskeletal:     Cervical back: Normal range of motion and neck supple.     Right lower leg: No edema.     Left lower leg: No edema.   Skin:    General: Skin is warm and dry.   Neurological:     General: No focal deficit present.     Mental Status: She is alert and oriented to person, place, and time.     Cranial Nerves: No cranial nerve deficit.     Motor: No weakness.     Coordination: Coordination normal.     Gait: Gait normal.   Psychiatric:        Mood and Affect: Mood normal.        Behavior: Behavior normal.        Thought Content: Thought content normal.      BP 138/78 (BP Location: Left Arm, Patient  Position: Sitting)   Pulse 92   Temp 98 F (36.7 C) (Temporal)   Ht 5' 1 (1.549 m)   Wt 116 lb 9.6 oz (52.9 kg)   LMP  (LMP Unknown)   SpO2 99%   BMI 22.03 kg/m  Wt Readings from Last 3 Encounters:  07/07/23 116 lb 9.6 oz (52.9 kg)  07/05/23 116 lb (52.6 kg)  06/22/23 119 lb 11.2 oz (54.3 kg)       Assessment & Plan:   Problem List Items Addressed This Visit     Aortic atherosclerosis (HCC)   Hypertension   Relevant Orders   TSH   Pulmonary emphysema (HCC)   Pure hypercholesterolemia - Primary   Relevant Orders   Lipid panel   Vitamin D  deficiency   Relevant Orders   VITAMIN D  25 Hydroxy (Vit-D Deficiency, Fractures)   Check lipids and continue statin. Continue healthy diet.  COPD and asymptomatic even with exercise.  PET scan did not show any pulmonary nodules or concerns.  Vitamin D  def- check vitamin D  level  Follow up in 3 months for CPE  I have discontinued Shameka G. Ledger's ondansetron , prochlorperazine , HYDROcodone -acetaminophen , and potassium chloride  SA. I am also having her maintain her Vitamin D3, (magic mouthwash (nystatin , lidocaine , diphenhydrAMINE, alum & mag hydroxide) suspension), atorvastatin , and diphenoxylate -atropine .  No orders of the defined types were placed in this encounter.

## 2023-07-11 ENCOUNTER — Other Ambulatory Visit (INDEPENDENT_AMBULATORY_CARE_PROVIDER_SITE_OTHER)

## 2023-07-11 ENCOUNTER — Ambulatory Visit: Payer: Self-pay | Admitting: Family Medicine

## 2023-07-11 ENCOUNTER — Other Ambulatory Visit: Payer: Self-pay | Admitting: Family Medicine

## 2023-07-11 DIAGNOSIS — I700 Atherosclerosis of aorta: Secondary | ICD-10-CM

## 2023-07-11 DIAGNOSIS — I10 Essential (primary) hypertension: Secondary | ICD-10-CM

## 2023-07-11 DIAGNOSIS — E7800 Pure hypercholesterolemia, unspecified: Secondary | ICD-10-CM | POA: Diagnosis not present

## 2023-07-11 DIAGNOSIS — E559 Vitamin D deficiency, unspecified: Secondary | ICD-10-CM

## 2023-07-11 LAB — LIPID PANEL
Cholesterol: 136 mg/dL (ref 0–200)
HDL: 46.4 mg/dL (ref >39.00)
LDL Cholesterol: 75 mg/dL (ref 0–99)
NonHDL: 89.62
Total CHOL/HDL Ratio: 3
Triglycerides: 74 mg/dL (ref 0.0–149.0)
VLDL: 14.8 mg/dL (ref 0.0–40.0)

## 2023-07-11 LAB — TSH: TSH: 2.29 u[IU]/mL (ref 0.35–5.50)

## 2023-07-11 LAB — VITAMIN D 25 HYDROXY (VIT D DEFICIENCY, FRACTURES): VITD: 37.45 ng/mL (ref 30.00–100.00)

## 2023-07-11 MED ORDER — ATORVASTATIN CALCIUM 10 MG PO TABS: ORAL_TABLET | ORAL | 1 refills | Status: DC

## 2023-10-12 ENCOUNTER — Ambulatory Visit: Admitting: Family Medicine

## 2023-10-12 ENCOUNTER — Encounter: Payer: Self-pay | Admitting: Family Medicine

## 2023-10-12 VITALS — BP 124/72 | HR 93 | Temp 97.8°F | Ht 61.0 in | Wt 112.0 lb

## 2023-10-12 DIAGNOSIS — E7800 Pure hypercholesterolemia, unspecified: Secondary | ICD-10-CM

## 2023-10-12 DIAGNOSIS — I700 Atherosclerosis of aorta: Secondary | ICD-10-CM

## 2023-10-12 DIAGNOSIS — I10 Essential (primary) hypertension: Secondary | ICD-10-CM

## 2023-10-12 DIAGNOSIS — E559 Vitamin D deficiency, unspecified: Secondary | ICD-10-CM

## 2023-10-12 DIAGNOSIS — Z87891 Personal history of nicotine dependence: Secondary | ICD-10-CM

## 2023-10-12 DIAGNOSIS — M8580 Other specified disorders of bone density and structure, unspecified site: Secondary | ICD-10-CM

## 2023-10-12 DIAGNOSIS — J439 Emphysema, unspecified: Secondary | ICD-10-CM

## 2023-10-12 NOTE — Progress Notes (Signed)
 Complete physical exam  Patient: Brittany Summers   DOB: 1955/10/18   68 y.o. Female  MRN: [id number]  Subjective:    Chief Complaint  Patient presents with   Annual Exam    Not fasting   She is here for a physical exam and to follow up on chronic health conditions.    Discussed the use of AI scribe software for clinical note transcription with the patient, who gave verbal consent to proceed.  History of Present Illness Brittany Summers is a 68 year old female who presents for her annual physical exam.  Cardiovascular health - Aortic atherosclerosis managed with atorvastatin  - Remains asymptomatic - Satisfied with current blood pressure control  Respiratory status - No respiratory symptoms - Able to walk and use stairs without difficulty  Preventive health maintenance - Plans to schedule colonoscopy after December - Plans to schedule appointment with eye doctor - Takes vitamin D , calcium , and a multivitamin with methylated folate - Remains diligent about health screenings and follow-ups despite caregiving responsibilities for husband with dementia    Health Maintenance  Topic Date Due   Zoster (Shingles) Vaccine (1 of 2) Never done   Screening for Lung Cancer  09/23/2023   Colon Cancer Screening  12/21/2023   COVID-19 Vaccine (3 - Pfizer risk series) 10/28/2023*   Flu Shot  04/17/2024*   Medicare Annual Wellness Visit  07/04/2024   DTaP/Tdap/Td vaccine (2 - Td or Tdap) 03/19/2026   Pneumococcal Vaccine for age over 74  Completed   DEXA scan (bone density measurement)  Completed   Hepatitis C Screening  Completed   HPV Vaccine  Aged Out   Meningitis B Vaccine  Aged Out   Hepatitis B Vaccine  Discontinued   Breast Cancer Screening  Discontinued  *Topic was postponed. The date shown is not the original due date.    Depression screening:    10/12/2023    9:43 AM 07/05/2023    9:37 AM 02/01/2023   10:01 AM  Depression screen PHQ 2/9  Decreased Interest 0 0 0   Down, Depressed, Hopeless 0 0 0  PHQ - 2 Score 0 0 0  Altered sleeping  0   Tired, decreased energy  0   Change in appetite  0   Feeling bad or failure about yourself   0   Trouble concentrating  0   Moving slowly or fidgety/restless  0   Suicidal thoughts  0   PHQ-9 Score  0   Difficult doing work/chores  Not difficult at all    Anxiety Screening:     No data to display           Patient Care Team: Lendia Boby CROME, NP-C as PCP - General (Family Medicine) Nandigam, Kavitha V, MD as Consulting Physician (Gastroenterology) [facility] as Consulting Physician (Ophthalmology) Burton, Lacie K, NP as Nurse Practitioner (Hematology and Oncology)   Outpatient Medications Prior to Visit  Medication Sig   atorvastatin  (LIPITOR) 10 MG tablet TAKE 1 TABLET BY MOUTH DAILY. Please schedule office visit for further refills.   Cholecalciferol (VITAMIN D3) 50 MCG (2000 UT) TABS Take 2,000 Units by mouth in the morning and at bedtime.   [DISCONTINUED] diphenoxylate -atropine  (LOMOTIL ) 2.5-0.025 MG tablet Take 1-2 tablets by mouth 4 (four) times daily as needed for diarrhea or loose stools.   [DISCONTINUED] magic mouthwash (nystatin , lidocaine , diphenhydrAMINE, alum & mag hydroxide) suspension Swish 5 mLs to coat mouth and spit out 4 (four) times daily. (Patient  not taking: Reported on 07/07/2023)   Facility-Administered Medications Prior to Visit  Medication Dose Route Frequency Provider   indocyanine green  (IC-GREEN ) injection 25 mg  25 mg Topical Once Byerly, Faera, MD    Review of Systems  Constitutional:  Negative for chills, fever, malaise/fatigue and weight loss.  HENT:  Negative for congestion, ear pain, sinus pain and sore throat.   Eyes:  Negative for blurred vision, double vision and pain.  Respiratory:  Negative for cough, shortness of breath and wheezing.   Cardiovascular:  Negative for chest pain, palpitations and leg swelling.  Gastrointestinal:  Negative  for abdominal pain, constipation, diarrhea, nausea and vomiting.  Genitourinary:  Negative for dysuria, frequency and urgency.  Musculoskeletal:  Negative for back pain, joint pain and myalgias.  Skin:  Negative for rash.  Neurological:  Negative for dizziness, tingling, focal weakness and headaches.  Psychiatric/Behavioral:  Negative for depression. The patient is not nervous/anxious.        Objective:    BP 124/72   Pulse 93   Temp 97.8 F (36.6 C) (Temporal)   Ht 5' 1 (1.549 m)   Wt 112 lb (50.8 kg)   LMP  (LMP Unknown)   SpO2 97%   BMI 21.16 kg/m  BP Readings from Last 3 Encounters:  10/12/23 124/72  07/07/23 138/78  06/22/23 120/70   Wt Readings from Last 3 Encounters:  10/12/23 112 lb (50.8 kg)  07/07/23 116 lb 9.6 oz (52.9 kg)  07/05/23 116 lb (52.6 kg)    Physical Exam Constitutional:      General: She is not in acute distress.    Appearance: She is not ill-appearing.  HENT:     Right Ear: Tympanic membrane, ear canal and external ear normal.     Left Ear: Tympanic membrane, ear canal and external ear normal.     Nose: Nose normal.     Mouth/Throat:     Mouth: Mucous membranes are moist.     Pharynx: Oropharynx is clear.  Eyes:     Extraocular Movements: Extraocular movements intact.     Conjunctiva/sclera: Conjunctivae normal.     Pupils: Pupils are equal, round, and reactive to light.  Neck:     Thyroid : No thyroid  mass, thyromegaly or thyroid  tenderness.  Cardiovascular:     Rate and Rhythm: Normal rate and regular rhythm.     Pulses: Normal pulses.     Heart sounds: Normal heart sounds.  Pulmonary:     Effort: Pulmonary effort is normal.     Breath sounds: Normal breath sounds.  Abdominal:     General: Bowel sounds are normal.     Palpations: Abdomen is soft.     Tenderness: There is no abdominal tenderness. There is no right CVA tenderness, left CVA tenderness, guarding or rebound.  Musculoskeletal:        General: Normal range of motion.      Cervical back: Normal range of motion and neck supple. No tenderness.     Right lower leg: No edema.     Left lower leg: No edema.  Lymphadenopathy:     Cervical: No cervical adenopathy.  Skin:    General: Skin is warm and dry.     Findings: No lesion or rash.  Neurological:     General: No focal deficit present.     Mental Status: She is alert and oriented to person, place, and time.     Cranial Nerves: No cranial nerve deficit.     Sensory: No  sensory deficit.     Motor: No weakness.     Gait: Gait normal.  Psychiatric:        Mood and Affect: Mood normal.        Behavior: Behavior normal.        Thought Content: Thought content normal.      No results found for any visits on 10/12/23.    Assessment & Plan:    Routine Health Maintenance and Physical Exam Problem List Items Addressed This Visit     Osteopenia after menopause (Chronic)   Aortic atherosclerosis - Primary   Hypertension   Pulmonary emphysema (HCC)   Relevant Orders   CT CHEST LUNG CANCER SCREENING LOW DOSE WO CONTRAST   Pure hypercholesterolemia   Stopped smoking with greater than 20 pack year history   Relevant Orders   CT CHEST LUNG CANCER SCREENING LOW DOSE WO CONTRAST   Vitamin D  deficiency    Assessment and Plan Assessment & Plan Adult Wellness Visit Annual physical exam completed with no new concerns. Medicare wellness visit completed. Labs from June were normal. - Schedule follow-up in 3-4 months for fasting labs.  Aortic atherosclerosis Aortic atherosclerosis managed with atorvastatin . No new symptoms reported.  Hyperlipidemia Hyperlipidemia well-managed with atorvastatin . Recent labs showed good cholesterol levels.  Osteopenia Osteopenia with up-to-date bone density. Next scan due October 2026. Currently taking vitamin D  and calcium  supplements.  Emphysema seen on CT - Asymptomatic   General Health Maintenance Colonoscopy due soon. Lung cancer screening with low-dose CT recommended  annually. Last CT in September last year. Eye exam needs scheduling. Caregiver responsibilities impact her ability to schedule appointments. - Order low-dose CT for lung cancer screening at [facility]. - Schedule colonoscopy after December. - Schedule eye exam.     Return in about 4 months (around 02/11/2024) for Fasting follow up.     Boby Mackintosh, NP-C

## 2023-10-15 ENCOUNTER — Other Ambulatory Visit: Payer: Self-pay | Admitting: Family Medicine

## 2023-10-15 DIAGNOSIS — I700 Atherosclerosis of aorta: Secondary | ICD-10-CM

## 2023-10-15 DIAGNOSIS — E7800 Pure hypercholesterolemia, unspecified: Secondary | ICD-10-CM

## 2023-10-20 ENCOUNTER — Encounter: Payer: Self-pay | Admitting: Gastroenterology

## 2023-11-21 ENCOUNTER — Other Ambulatory Visit: Payer: Self-pay

## 2023-12-06 ENCOUNTER — Ambulatory Visit (HOSPITAL_COMMUNITY)
Admission: RE | Admit: 2023-12-06 | Discharge: 2023-12-06 | Disposition: A | Discharge status: Home / Self Care | Source: Ambulatory Visit | Attending: Hematology | Admitting: Hematology

## 2023-12-06 ENCOUNTER — Inpatient Hospital Stay: Attending: Hematology

## 2023-12-06 DIAGNOSIS — C210 Malignant neoplasm of anus, unspecified: Secondary | ICD-10-CM

## 2023-12-06 LAB — CBC WITH DIFFERENTIAL (CANCER CENTER ONLY)
Abs Immature Granulocytes: 0.02 K/uL (ref 0.00–0.07)
Basophils Absolute: 0.1 K/uL (ref 0.0–0.1)
Basophils Relative: 1 %
Eosinophils Absolute: 0.1 K/uL (ref 0.0–0.5)
Eosinophils Relative: 1 %
HCT: 39.7 % (ref 36.0–46.0)
Hemoglobin: 12.9 g/dL (ref 12.0–15.0)
Immature Granulocytes: 0 %
Lymphocytes Relative: 12 %
Lymphs Abs: 1 K/uL (ref 0.7–4.0)
MCH: 30.4 pg (ref 26.0–34.0)
MCHC: 32.5 g/dL (ref 30.0–36.0)
MCV: 93.4 fL (ref 80.0–100.0)
Monocytes Absolute: 0.8 K/uL (ref 0.1–1.0)
Monocytes Relative: 9 %
Neutro Abs: 6.8 K/uL (ref 1.7–7.7)
Neutrophils Relative %: 77 %
Platelet Count: 236 K/uL (ref 150–400)
RBC: 4.25 MIL/uL (ref 3.87–5.11)
RDW: 13.5 % (ref 11.5–15.5)
WBC Count: 8.8 K/uL (ref 4.0–10.5)
nRBC: 0 % (ref 0.0–0.2)

## 2023-12-06 LAB — CMP (CANCER CENTER ONLY)
ALT: 9 U/L (ref 0–44)
AST: 14 U/L — ABNORMAL LOW (ref 15–41)
Albumin: 4.5 g/dL (ref 3.5–5.0)
Alkaline Phosphatase: 58 U/L (ref 38–126)
Anion gap: 7 (ref 5–15)
BUN: 11 mg/dL (ref 8–23)
CO2: 27 mmol/L (ref 22–32)
Calcium: 9.9 mg/dL (ref 8.9–10.3)
Chloride: 103 mmol/L (ref 98–111)
Creatinine: 0.51 mg/dL (ref 0.44–1.00)
GFR, Estimated: >60 mL/min
Glucose, Bld: 99 mg/dL (ref 70–99)
Potassium: 4.2 mmol/L (ref 3.5–5.1)
Sodium: 137 mmol/L (ref 135–145)
Total Bilirubin: 0.5 mg/dL (ref 0.0–1.2)
Total Protein: 7.3 g/dL (ref 6.5–8.1)

## 2023-12-06 MED ORDER — IOHEXOL 9 MG/ML PO SOLN
1000.0000 mL | ORAL | Status: AC
  Administered 2023-12-06: 1000 mL via ORAL

## 2023-12-06 MED ORDER — IOHEXOL 300 MG/ML  SOLN
80.0000 mL | Freq: Once | INTRAMUSCULAR | Status: AC | PRN
  Administered 2023-12-06: 80 mL via INTRAVENOUS

## 2023-12-06 MED ORDER — IOHEXOL 9 MG/ML PO SOLN
ORAL | Status: AC
  Filled 2023-12-06: qty 1000

## 2023-12-19 ENCOUNTER — Ambulatory Visit (AMBULATORY_SURGERY_CENTER)

## 2023-12-19 VITALS — Ht 61.0 in | Wt 117.0 lb

## 2023-12-19 DIAGNOSIS — Z860100 Personal history of colon polyps, unspecified: Secondary | ICD-10-CM

## 2023-12-19 DIAGNOSIS — Z85038 Personal history of other malignant neoplasm of large intestine: Secondary | ICD-10-CM

## 2023-12-19 MED ORDER — NA SULFATE-K SULFATE-MG SULF 17.5-3.13-1.6 GM/177ML PO SOLN: 1.0000 | Freq: Once | ORAL | 0 refills | Status: AC

## 2023-12-19 NOTE — Progress Notes (Signed)
 PCP MD at time of PV: Boby Mackintosh, NP-C __________________________________________________________________________________________________________________________________________  No egg allergy known to patient  No soy allergy known to patient No issues known to pt with past sedation with any surgeries or procedures Patient denies ever being told they had issues or difficulty with intubation  No FH of Malignant Hyperthermia Pt is not on diet pills Pt is not on  home 02  Pt is not on blood thinners  No A fib or A flutter Have any cardiac testing pending--no LOA: independent  No Chew or Snuff tobacco __________________________________________________________________________________________________________________________________________  Constipation: no  Prep: suprep __________________________________________________________________________________________________________________________________________  PV completed with patient. Prep instructions reviewed and provided during apt. Rx sent to preferred pharmacy.  __________________________________________________________________________________________________________________________________________  Patient's chart reviewed by Norleen Schillings CNRA prior to previsit and patient appropriate for the [facility].  Previsit completed and red dot placed by patient's name on their procedure day (on provider's schedule).

## 2023-12-22 ENCOUNTER — Inpatient Hospital Stay: Attending: Hematology | Admitting: Hematology

## 2023-12-22 VITALS — BP 136/82 | HR 93 | Temp 98.3°F | Resp 18 | Ht 61.0 in | Wt 117.4 lb

## 2023-12-22 DIAGNOSIS — B182 Chronic viral hepatitis C: Secondary | ICD-10-CM | POA: Insufficient documentation

## 2023-12-22 DIAGNOSIS — C210 Malignant neoplasm of anus, unspecified: Secondary | ICD-10-CM | POA: Diagnosis present

## 2023-12-22 DIAGNOSIS — Z9221 Personal history of antineoplastic chemotherapy: Secondary | ICD-10-CM | POA: Insufficient documentation

## 2023-12-22 DIAGNOSIS — Z923 Personal history of irradiation: Secondary | ICD-10-CM | POA: Insufficient documentation

## 2023-12-22 DIAGNOSIS — Z79899 Other long term (current) drug therapy: Secondary | ICD-10-CM | POA: Insufficient documentation

## 2023-12-22 DIAGNOSIS — I10 Essential (primary) hypertension: Secondary | ICD-10-CM | POA: Diagnosis not present

## 2023-12-22 DIAGNOSIS — D259 Leiomyoma of uterus, unspecified: Secondary | ICD-10-CM | POA: Insufficient documentation

## 2023-12-22 NOTE — Progress Notes (Signed)
 [facility]   [phone] [phone]   Clinic Follow up Note   Patient Care Team: Lendia Boby CROME, NP-C as PCP - General (Family Medicine) Shila Gustav GAILS, MD as Consulting Physician (Gastroenterology) [facility] as Consulting Physician (Ophthalmology) Burton, Lacie K, NP as Nurse Practitioner (Hematology and Oncology)  Date of Service:  12/22/2023  CHIEF COMPLAINT: f/u of anal cancer  CURRENT THERAPY:  Cancer surveillance  Oncology History   Anal cancer (HCC) cT1N0M0 stage I -Discussed screening colonoscopy -Seen by provider secondary Debby, felt not to be a candidate for endoscopy resection. -Patient started concurrent chemoradiation on February 07, 2023 with mitomycin  and 5-FU regimen, tolerated the treatment well overall, she completed chemoradiation on March 16, 2023. -PET 06/16/2023 showed minimal residual anorectal hypermetabolism, anoscopy negative  -CT 11/2023 negative for residual or metastatic disease   Assessment & Plan Surveillance for history of anal cancer Anal cancer diagnosis over a year ago, status post concurrent chemoradiation, with no signs of recurrence on recent CT scan and normal blood work. Regular surveillance is ongoing. - Continue alternating visits with Dr. Debby every three months. - Will schedule endoscopic exam with Dr. Debby every three months. -will repeat next CT in 6 months   Postmenopausal calcified uterine fibroid Calcified uterine fibroid noted postmenopausally, expected to gradually decrease in size. - No immediate intervention required.  Plan - Lab in the recent surveillance CT scan reviewed, NED - she is clinically doing well overall, will continue endoscope with Dr. Debby every 3 months - Lab and follow-up in 3 months   SUMMARY OF ONCOLOGIC HISTORY: Oncology History  Anal cancer (HCC)  01/25/2023 Initial Diagnosis   Anal cancer (HCC)   02/07/2023 -  Chemotherapy    Patient is on Treatment Plan : ANUS Mitomycin  D1,28 + 5FU D1-4, 28-31 q32d     02/07/2023 Cancer Staging   Staging form: Anus, AJCC V9 - Clinical: Stage I (cT1, cN0, cM0) - Signed by Burton, Lacie K, NP on 02/07/2023 Stage prefix: Initial diagnosis      Discussed the use of AI scribe software for clinical note transcription with the patient, who gave verbal consent to proceed.  History of Present Illness APRIL COLTER is a 68 year old female with endometrial cancer who presents for follow-up.  She has no new symptoms. Her bowel movements are normal. An anorectal exam about two to three months ago was normal.  She recently had a CT scan and blood work as part of ongoing surveillance now over a year from her endometrial cancer diagnosis.  She mentions a calcified uterine fibroid. Her bowel movements remain normal.     All other systems were reviewed with the patient and are negative.  MEDICAL HISTORY:  Past Medical History:  Diagnosis Date   Allergy 1960   penicillin   Anemia    Cancer (HCC)    Chronic hepatitis C without hepatic coma (HCC) 09/22/2016   finished treatment for this   Gallstone    known with h/o cholecystitis pain, no recent recurrence   Hypertension    no meds taken    SURGICAL HISTORY: Past Surgical History:  Procedure Laterality Date   BREAST BIOPSY Left 2017   CHOLECYSTECTOMY N/A 08/04/2022   Procedure: LAPAROSCOPIC CHOLECYSTECTOMY;  Surgeon: Aron Shoulders, MD;  Location: [facility] OR;  Service: General;  Laterality: N/A;   COLONOSCOPY     FRACTURE SURGERY  07/06/2018   Ankle surgery left   MAXILLARY SINUS LIFT  MOUTH SURGERY     ORIF ANKLE FRACTURE Left 07/06/2018   Procedure: OPEN REDUCTION INTERNAL FIXATION (ORIF) left ankle trimalleolar fracture;  Surgeon: Kit Rush, MD;  Location: [facility];  Service: Orthopedics;  Laterality: Left;    TONSILLECTOMY     age 55   UPPER GASTROINTESTINAL ENDOSCOPY      I have  reviewed the social history and family history with the patient and they are unchanged from previous note.  ALLERGIES:  is allergic to penicillins.  MEDICATIONS:  Current Outpatient Medications  Medication Sig Dispense Refill   atorvastatin  (LIPITOR) 10 MG tablet TAKE 1 TABLET BY MOUTH DAILY 90 tablet 1   Cholecalciferol (VITAMIN D3) 50 MCG (2000 UT) TABS Take 2,000 Units by mouth in the morning and at bedtime.     No current facility-administered medications for this visit.   Facility-Administered Medications Ordered in Other Visits  Medication Dose Route Frequency Provider Last Rate Last Admin   indocyanine green  (IC-GREEN ) injection 25 mg  25 mg Topical Once Aron Shoulders, MD        PHYSICAL EXAMINATION: ECOG PERFORMANCE STATUS: 0 - Asymptomatic  Vitals:   12/22/23 1002 12/22/23 1004  BP: (!) 151/92 136/82  Pulse: (!) 103 93  Resp: 18   Temp: 98.3 F (36.8 C)   SpO2: 97% 97%   Wt Readings from Last 3 Encounters:  12/22/23 117 lb 6.4 oz (53.3 kg)  12/19/23 117 lb (53.1 kg)  10/12/23 112 lb (50.8 kg)     GENERAL:alert, no distress and comfortable SKIN: skin color, texture, turgor are normal, no rashes or significant lesions EYES: normal, Conjunctiva are pink and non-injected, sclera clear Musculoskeletal:no cyanosis of digits and no clubbing  NEURO: alert & oriented x 3 with fluent speech, no focal motor/sensory deficits  Physical Exam    LABORATORY DATA:  I have reviewed the data as listed    Latest Ref Rng & Units 12/06/2023    9:52 AM 06/16/2023    9:04 AM 04/05/2023   10:31 AM  CBC  WBC 4.0 - 10.5 K/uL 8.8  7.8  4.5   Hemoglobin 12.0 - 15.0 g/dL 87.0  87.9  9.5   Hematocrit 36.0 - 46.0 % 39.7  34.8  28.4   Platelets 150 - 400 K/uL 236  202  92         Latest Ref Rng & Units 12/06/2023    9:52 AM 06/16/2023    9:04 AM 04/05/2023   10:31 AM  CMP  Glucose 70 - 99 mg/dL 99  897  895   BUN 8 - 23 mg/dL 11  9  12    Creatinine 0.44 - 1.00 mg/dL 9.48  9.53   9.55   Sodium 135 - 145 mmol/L 137  135  140   Potassium 3.5 - 5.1 mmol/L 4.2  4.3  4.0   Chloride 98 - 111 mmol/L 103  101  105   CO2 22 - 32 mmol/L 27  28  29    Calcium  8.9 - 10.3 mg/dL 9.9  9.6  9.3   Total Protein 6.5 - 8.1 g/dL 7.3  6.8  6.5   Total Bilirubin 0.0 - 1.2 mg/dL 0.5  0.6  0.6   Alkaline Phos 38 - 126 U/L 58  43  48   AST 15 - 41 U/L 14  14  15    ALT 0 - 44 U/L 9  10  18        RADIOGRAPHIC STUDIES: I have personally  reviewed the radiological images as listed and agreed with the findings in the report. No results found.    No orders of the defined types were placed in this encounter.  All questions were answered. The patient knows to call the clinic with any problems, questions or concerns. No barriers to learning was detected. The total time spent in the appointment was 25 minutes, including review of chart and various tests results, discussions about plan of care and coordination of care plan     Onita Mattock, MD 12/22/2023

## 2023-12-22 NOTE — Assessment & Plan Note (Addendum)
 cT1N0M0 stage I -Discussed screening colonoscopy -Seen by provider secondary Brittany Summers, felt not to be a candidate for endoscopy resection. -Patient started concurrent chemoradiation on February 07, 2023 with mitomycin  and 5-FU regimen, tolerated the treatment well overall, she completed chemoradiation on March 16, 2023. -PET 06/16/2023 showed minimal residual anorectal hypermetabolism, anoscopy negative  -CT 11/2023 negative for residual or metastatic disease

## 2023-12-28 ENCOUNTER — Other Ambulatory Visit: Payer: Self-pay | Admitting: *Deleted

## 2023-12-28 DIAGNOSIS — Z87891 Personal history of nicotine dependence: Secondary | ICD-10-CM

## 2023-12-28 DIAGNOSIS — Z122 Encounter for screening for malignant neoplasm of respiratory organs: Secondary | ICD-10-CM

## 2024-01-02 ENCOUNTER — Ambulatory Visit: Admitting: Gastroenterology

## 2024-01-02 ENCOUNTER — Encounter: Payer: Self-pay | Admitting: Gastroenterology

## 2024-01-02 VITALS — BP 115/70 | HR 84 | Temp 98.2°F | Resp 14 | Ht 61.0 in | Wt 117.0 lb

## 2024-01-02 DIAGNOSIS — D125 Benign neoplasm of sigmoid colon: Secondary | ICD-10-CM

## 2024-01-02 DIAGNOSIS — Z860100 Personal history of colon polyps, unspecified: Secondary | ICD-10-CM

## 2024-01-02 DIAGNOSIS — K635 Polyp of colon: Secondary | ICD-10-CM | POA: Diagnosis not present

## 2024-01-02 DIAGNOSIS — Z85038 Personal history of other malignant neoplasm of large intestine: Secondary | ICD-10-CM

## 2024-01-02 MED ORDER — SODIUM CHLORIDE 0.9 % IV SOLN: 500.0000 mL | INTRAVENOUS | Status: DC

## 2024-01-02 NOTE — Progress Notes (Signed)
 Called to room to assist during endoscopic procedure.  Patient ID and intended procedure confirmed with present staff. Received instructions for my participation in the procedure from the performing physician.

## 2024-01-02 NOTE — Progress Notes (Signed)
 [facility] History and Physical   Primary Care Physician:  Lendia Boby CROME, NP-C   Reason for Procedure:  History of adenomatous colon polyps, history of colon cancer 2024  Plan:    Surveillance colonoscopy with possible interventions as needed     HPI: Brittany Summers is a very pleasant 68 y.o. female here for surveillance colonoscopy. Denies any nausea, vomiting, abdominal pain, melena or bright red blood per rectum  The risks and benefits as well as alternatives of endoscopic procedure(s) have been discussed and reviewed.  The patient was provided an opportunity to ask questions and all were answered. The patient agreed with the plan and demonstrated an understanding of the instructions.   Past Medical History:  Diagnosis Date   Allergy 1960   penicillin   Anemia    Cancer (HCC)    Chronic hepatitis C without hepatic coma (HCC) 09/22/2016   finished treatment for this   Gallstone    known with h/o cholecystitis pain, no recent recurrence   Hypertension    no meds taken    Past Surgical History:  Procedure Laterality Date   BREAST BIOPSY Left 2017   CHOLECYSTECTOMY N/A 08/04/2022   Procedure: LAPAROSCOPIC CHOLECYSTECTOMY;  Surgeon: Aron Shoulders, MD;  Location: [facility] OR;  Service: General;  Laterality: N/A;   COLONOSCOPY     FRACTURE SURGERY  07/06/2018   Ankle surgery left   MAXILLARY SINUS LIFT     MOUTH SURGERY     ORIF ANKLE FRACTURE Left 07/06/2018   Procedure: OPEN REDUCTION INTERNAL FIXATION (ORIF) left ankle trimalleolar fracture;  Surgeon: Kit Rush, MD;  Location: [facility];  Service: Orthopedics;  Laterality: Left;    TONSILLECTOMY     age 35   UPPER GASTROINTESTINAL ENDOSCOPY      Prior to Admission medications  Medication Sig Start Date End Date Taking? Authorizing Provider  atorvastatin  (LIPITOR) 10 MG tablet TAKE 1 TABLET BY MOUTH DAILY 10/17/23  Yes Henson, Vickie L, NP-C  Cholecalciferol (VITAMIN D3) 50 MCG  (2000 UT) TABS Take 2,000 Units by mouth in the morning and at bedtime.   Yes [provider]    Current Outpatient Medications  Medication Sig Dispense Refill   atorvastatin  (LIPITOR) 10 MG tablet TAKE 1 TABLET BY MOUTH DAILY 90 tablet 1   Cholecalciferol (VITAMIN D3) 50 MCG (2000 UT) TABS Take 2,000 Units by mouth in the morning and at bedtime.     Current Facility-Administered Medications  Medication Dose Route Frequency Provider Last Rate Last Admin   0.9 %  sodium chloride  infusion  500 mL Intravenous Continuous Shan Valdes V, MD       Facility-Administered Medications Ordered in Other Visits  Medication Dose Route Frequency Provider Last Rate Last Admin   indocyanine green  (IC-GREEN ) injection 25 mg  25 mg Topical Once Byerly, Faera, MD        Allergies as of 01/02/2024 - Review Complete 01/02/2024  Allergen Reaction Noted   Neosporin [bacitracin-polymyxin b] Hives 01/02/2024   Penicillins Hives 06/27/2015    Family History  Problem Relation Age of Onset   Hypertension Mother    Breast cancer Mother        Dx in her early 79's   Arthritis Mother    Hypertension Father    Diabetes Father    Leukemia Father    Colon cancer Maternal Uncle 76 - 79   Breast cancer Maternal Grandmother    Stomach cancer Maternal Grandmother 73   Stomach cancer  Maternal Great-grandmother    Diabetes Sister    Colon polyps Neg Hx    Rectal cancer Neg Hx    Esophageal cancer Neg Hx     Social History   Socioeconomic History   Marital status: Married    Spouse name: Not on file   Number of children: 0   Years of education: Not on file   Highest education level: Associate degree: occupational, scientist, product/process development, or vocational program  Occupational History   Occupation: retired  Tobacco Use   Smoking status: Former    Current packs/day: 0.00    Types: Cigarettes    Quit date: 07/15/2015    Years since quitting: 8.4   Smokeless tobacco: Never  Vaping Use   Vaping status:  Never Used  Substance and Sexual Activity   Alcohol use: No    Alcohol/week: 0.0 standard drinks of alcohol    Comment: as of 04-28-16 no alcohol    Drug use: No   Sexual activity: Not Currently    Partners: Male  Other Topics Concern   Not on file  Social History Narrative   Not on file   Social Drivers of Health   Tobacco Use: Medium Risk (01/02/2024)   Patient History    Smoking Tobacco Use: Former    Smokeless Tobacco Use: Never    Passive Exposure: Not on Actuary Strain: Low Risk (07/05/2023)   Overall Financial Resource Strain (CARDIA)    Difficulty of Paying Living Expenses: Not very hard  Food Insecurity: No Food Insecurity (07/05/2023)   Epic    Worried About Programme Researcher, Broadcasting/film/video in the Last Year: Never true    Ran Out of Food in the Last Year: Never true  Transportation Needs: No Transportation Needs (07/05/2023)   Epic    Lack of Transportation (Medical): No    Lack of Transportation (Non-Medical): No  Physical Activity: Insufficiently Active (07/05/2023)   Exercise Vital Sign    Days of Exercise per Week: 3 days    Minutes of Exercise per Session: 30 min  Stress: No Stress Concern Present (07/05/2023)   [employer] of Occupational Health - Occupational Stress Questionnaire    Feeling of Stress: Not at all  Social Connections: Socially Integrated (07/05/2023)   Social Connection and Isolation Panel    Frequency of Communication with Friends and Family: More than three times a week    Frequency of Social Gatherings with Friends and Family: Three times a week    Attends Religious Services: More than 4 times per year    Active Member of Clubs or Organizations: No    Attends Banker Meetings: More than 4 times per year    Marital Status: Married  Catering Manager Violence: Not At Risk (07/05/2023)   Epic    Fear of Current or Ex-Partner: No    Emotionally Abused: No    Physically Abused: No    Sexually Abused: No  Depression  (PHQ2-9): Low Risk (12/22/2023)   Depression (PHQ2-9)    PHQ-2 Score: 0  Alcohol Screen: Low Risk (07/05/2023)   Alcohol Screen    Last Alcohol Screening Score (AUDIT): 0  Housing: Low Risk (07/05/2023)   Epic    Unable to Pay for Housing in the Last Year: No    Number of Times Moved in the Last Year: 0    Homeless in the Last Year: No  Utilities: Not At Risk (07/05/2023)   Epic    Threatened with loss of utilities: No  Health Literacy: Adequate Health Literacy (07/05/2023)   B1300 Health Literacy    Frequency of need for help with medical instructions: Never    Review of Systems:  All other review of systems negative except as mentioned in the HPI.  Physical Exam: Vital signs in last 24 hours: BP 132/75   Pulse (!) 110   Temp 98.2 F (36.8 C) (Temporal)   Ht 5' 1 (1.549 m)   Wt 117 lb (53.1 kg)   LMP  (LMP Unknown)   SpO2 98%   BMI 22.11 kg/m  General:   Alert, NAD Lungs:  Clear .   Heart:  Regular rate and rhythm Abdomen:  Soft, nontender and nondistended. Neuro/Psych:  Alert and cooperative. Normal mood and affect. A and O x 3  Reviewed labs, radiology imaging, old records and pertinent past GI work up  Patient is appropriate for planned procedure(s) and anesthesia in an ambulatory setting   K. Veena Elisse Pennick , MD [phone]

## 2024-01-02 NOTE — Progress Notes (Signed)
 Pt's states no medical or surgical changes since previsit or office visit.

## 2024-01-02 NOTE — Patient Instructions (Addendum)
° ° ° ° °  Handout on polyps and hemorrhoids given to you   Await pathology results on polyp removed     YOU HAD AN ENDOSCOPIC PROCEDURE TODAY AT THE [facility]:   Refer to the procedure report that was given to you for any specific questions about what was found during the examination.  If the procedure report does not answer your questions, please call your gastroenterologist to clarify.  If you requested that your care partner not be given the details of your procedure findings, then the procedure report has been included in a sealed envelope for you to review at your convenience later.  YOU SHOULD EXPECT: Some feelings of bloating in the abdomen. Passage of more gas than usual.  Walking can help get rid of the air that was put into your GI tract during the procedure and reduce the bloating. If you had a lower endoscopy (such as a colonoscopy or flexible sigmoidoscopy) you may notice spotting of blood in your stool or on the toilet paper. If you underwent a bowel prep for your procedure, you may not have a normal bowel movement for a few days.  Please Note:  You might notice some irritation and congestion in your nose or some drainage.  This is from the oxygen used during your procedure.  There is no need for concern and it should clear up in a day or so.  SYMPTOMS TO REPORT IMMEDIATELY:  Following lower endoscopy (colonoscopy or flexible sigmoidoscopy):  Excessive amounts of blood in the stool  Significant tenderness or worsening of abdominal pains  Swelling of the abdomen that is new, acute  Fever of 100F or higher   For urgent or emergent issues, a gastroenterologist can be reached at any hour by calling [phone]. Do not use MyChart messaging for urgent concerns.    DIET:  We do recommend a small meal at first, but then you may proceed to your regular diet.  Drink plenty of fluids but you should avoid alcoholic beverages for 24 hours.  ACTIVITY:  You should plan  to take it easy for the rest of today and you should NOT DRIVE or use heavy machinery until tomorrow (because of the sedation medicines used during the test).    FOLLOW UP: Our staff will call the number listed on your records the next business day following your procedure.  We will call around 7:15- 8:00 am to check on you and address any questions or concerns that you may have regarding the information given to you following your procedure. If we do not reach you, we will leave a message.     If any biopsies were taken you will be contacted by phone or by letter within the next 1-3 weeks.  Please call us  at [phone] if you have not heard about the biopsies in 3 weeks.    SIGNATURES/CONFIDENTIALITY: You and/or your care partner have signed paperwork which will be entered into your electronic medical record.  These signatures attest to the fact that that the information above on your After Visit Summary has been reviewed and is understood.  Full responsibility of the confidentiality of this discharge information lies with you and/or your care-partner.

## 2024-01-02 NOTE — Progress Notes (Signed)
 Report given to PACU, vss

## 2024-01-02 NOTE — Op Note (Signed)
 [facility] Patient Name: Brittany Summers Procedure Date: 01/02/2024 9:47 AM MRN: [id number] Endoscopist: Gustav ALONSO Mcgee , MD, [id number] Age: 68 Referring MD:  Date of Birth: December 04, 1955 Gender: Female Account #: [id number] Procedure:                Colonoscopy Indications:              High risk colon cancer surveillance: Personal                            history of adenoma (10 mm or greater in size), High                            risk colon cancer surveillance: Personal history of                            multiple (3 or more) adenomas, High risk colon                            cancer surveillance: Personal history of sessile                            serrated colon polyp (10 mm or greater in size),                            High risk colon cancer surveillance: Personal                            history of colon cancer Medicines:                Monitored Anesthesia Care Procedure:                Pre-Anesthesia Assessment:                           - Prior to the procedure, a History and Physical                            was performed, and patient medications and                            allergies were reviewed. The patient's tolerance of                            previous anesthesia was also reviewed. The risks                            and benefits of the procedure and the sedation                            options and risks were discussed with the patient.                            All questions were answered, and informed consent  was obtained. Prior Anticoagulants: The patient has                            taken no anticoagulant or antiplatelet agents. ASA                            Grade Assessment: II - A patient with mild systemic                            disease. After reviewing the risks and benefits,                            the patient was deemed in satisfactory condition to                            undergo  the procedure.                           After obtaining informed consent, the colonoscope                            was passed under direct vision. Throughout the                            procedure, the patient's blood pressure, pulse, and                            oxygen saturations were monitored continuously. The                            Olympus Scope SN [id number] was introduced through the                            anus and advanced to the the cecum, identified by                            the appendiceal orifice, IC valve and                            transillumination. The colonoscopy was performed                            without difficulty. The patient tolerated the                            procedure well. The quality of the bowel                            preparation was good. The terminal ileum, ileocecal                            valve, appendiceal orifice, and rectum were  photographed. Scope In: 10:01:37 AM Scope Out: 10:16:42 AM Scope Withdrawal Time: 0 hours 9 minutes 50 seconds  Total Procedure Duration: 0 hours 15 minutes 5 seconds  Findings:                 The perianal and digital rectal examinations were                            normal.                           A 5 mm polyp was found in the sigmoid colon. The                            polyp was sessile. The polyp was removed with a                            cold snare. Resection and retrieval were complete.                           The mucosa vascular pattern in the distal rectum                            was locally increased with changes s/p radiation.                            No evidence of recurrence of rectal cancer                           Non-bleeding external and internal hemorrhoids were                            found during retroflexion. The hemorrhoids were                            medium-sized. Complications:            No immediate  complications. Estimated Blood Loss:     Estimated blood loss was minimal. Impression:               - One 5 mm polyp in the sigmoid colon, removed with                            a cold snare. Resected and retrieved.                           - Increased mucosa vascular pattern in the distal                            rectum.                           - Non-bleeding external and internal hemorrhoids. Recommendation:           - Resume previous diet.                           -  Continue present medications.                           - Await pathology results.                           - Repeat colonoscopy in 1 year for surveillance                            based on pathology results. Ryken Paschal V. Kaylanni Ezelle, MD 01/02/2024 10:23:52 AM This report has been signed electronically.

## 2024-01-03 ENCOUNTER — Telehealth: Payer: Self-pay

## 2024-01-03 NOTE — Telephone Encounter (Signed)
°  Follow up Call-     01/02/2024    8:52 AM 12/21/2022    7:51 AM  Call back number  Post procedure Call Back phone  # [phone]  Permission to leave phone message Yes Yes     Patient questions:  Do you have a fever, pain , or abdominal swelling? No. Pain Score  0 *  Have you tolerated food without any problems? Yes.    Have you been able to return to your normal activities? Yes.    Do you have any questions about your discharge instructions: Diet   No. Medications  No. Follow up visit  No.  Do you have questions or concerns about your Care? No.  Actions: * If pain score is 4 or above: No action needed, pain <4.

## 2024-01-05 LAB — SURGICAL PATHOLOGY

## 2024-02-15 ENCOUNTER — Ambulatory Visit: Admitting: Family Medicine

## 2024-02-16 ENCOUNTER — Ambulatory Visit: Payer: Self-pay | Admitting: Gastroenterology

## 2024-03-21 ENCOUNTER — Inpatient Hospital Stay: Admitting: Hematology

## 2024-03-21 ENCOUNTER — Inpatient Hospital Stay

## 2024-07-05 ENCOUNTER — Ambulatory Visit
